# Patient Record
Sex: Male | Born: 1940 | Race: Black or African American | Hispanic: No | Smoking: Never smoker
Health system: Southern US, Community
[De-identification: ages and names within clinical notes are randomized; demographics above are authoritative.]

## PROBLEM LIST (undated history)

## (undated) DIAGNOSIS — N289 Disorder of kidney and ureter, unspecified: Secondary | ICD-10-CM

## (undated) DIAGNOSIS — F0390 Unspecified dementia without behavioral disturbance: Secondary | ICD-10-CM

## (undated) DIAGNOSIS — E119 Type 2 diabetes mellitus without complications: Secondary | ICD-10-CM

## (undated) DIAGNOSIS — I700 Atherosclerosis of aorta: Secondary | ICD-10-CM

## (undated) DIAGNOSIS — Z8679 Personal history of other diseases of the circulatory system: Secondary | ICD-10-CM

## (undated) DIAGNOSIS — I10 Essential (primary) hypertension: Secondary | ICD-10-CM

## (undated) HISTORY — PX: BELOW KNEE LEG AMPUTATION: SUR23

---

## 2017-08-13 ENCOUNTER — Encounter (HOSPITAL_COMMUNITY): Payer: Self-pay | Admitting: Nurse Practitioner

## 2017-08-13 ENCOUNTER — Emergency Department (HOSPITAL_COMMUNITY)
Admission: EM | Admit: 2017-08-13 | Discharge: 2017-08-13 | Disposition: A | Discharge status: Home / Self Care | Payer: Medicare Other | Attending: Emergency Medicine | Admitting: Emergency Medicine

## 2017-08-13 DIAGNOSIS — I10 Essential (primary) hypertension: Secondary | ICD-10-CM | POA: Diagnosis present

## 2017-08-13 DIAGNOSIS — R030 Elevated blood-pressure reading, without diagnosis of hypertension: Secondary | ICD-10-CM

## 2017-08-13 DIAGNOSIS — E119 Type 2 diabetes mellitus without complications: Secondary | ICD-10-CM | POA: Insufficient documentation

## 2017-08-13 HISTORY — DX: Type 2 diabetes mellitus without complications: E11.9

## 2017-08-13 HISTORY — DX: Essential (primary) hypertension: I10

## 2017-08-13 LAB — CBG MONITORING, ED: GLUCOSE-CAPILLARY: 227 mg/dL — AB (ref 65–99)

## 2017-08-13 MED ORDER — LOSARTAN POTASSIUM 50 MG PO TABS
50.0000 mg | ORAL_TABLET | Freq: Once | ORAL | Status: AC
  Administered 2017-08-13: 50 mg via ORAL
  Filled 2017-08-13: qty 1

## 2017-08-13 MED ORDER — AMLODIPINE BESYLATE 5 MG PO TABS
10.0000 mg | ORAL_TABLET | Freq: Once | ORAL | Status: AC
  Administered 2017-08-13: 10 mg via ORAL
  Filled 2017-08-13: qty 2

## 2017-08-13 MED ORDER — POLYETHYLENE GLYCOL 3350 17 GM/SCOOP PO POWD: 17.0000 g | Freq: Every day | ORAL | 0 refills | Status: AC

## 2017-08-13 MED ORDER — FUROSEMIDE 40 MG PO TABS
40.0000 mg | ORAL_TABLET | Freq: Once | ORAL | Status: AC
  Administered 2017-08-13: 40 mg via ORAL
  Filled 2017-08-13: qty 1

## 2017-08-13 MED ORDER — CARVEDILOL 25 MG PO TABS
25.0000 mg | ORAL_TABLET | Freq: Two times a day (BID) | ORAL | Status: DC
  Administered 2017-08-13: 25 mg via ORAL
  Filled 2017-08-13: qty 1

## 2017-08-13 NOTE — ED Provider Notes (Signed)
[facility] DEPT Provider Note   CSN: [id number] Arrival date & time: 08/13/17  1506     History   Chief Complaint No chief complaint on file.   HPI Shawn Pearson is a 76 y.o. male.  HPI 76 year old male who presents with elevated blood pressure. Patient transferred from [facility]. He states he did not receive any of his blood pressure medications today. When he arrived to nursing facility, he was told his blood pressure of high. He was sent to the ED. He complains of constipation. Also endorses mild headache. Denies nausea, vomiting, confusion, focal numbness/weakness, vision or speech changes, difficulty breathing.   Past Medical History:  Diagnosis Date  . Diabetes mellitus without complication ([place])   . Hypertension     There are no active problems to display for this patient.   History reviewed. No pertinent surgical history.     Home Medications    Prior to Admission medications   Not on File    Family History History reviewed. No pertinent family history.  Social History Social History  Substance Use Topics  . Smoking status: Unknown If Ever Smoked  . Smokeless tobacco: Never Used  . Alcohol use No     Allergies   Patient has no allergy information on record.   Review of Systems Review of Systems  Constitutional: Negative for fever.  Respiratory: Negative for shortness of breath.   Gastrointestinal: Positive for constipation. Negative for nausea and vomiting.  Neurological: Negative for speech difficulty.     Physical Exam Updated Vital Signs BP (!) 203/100 (BP Location: Right Arm)   Pulse 83   Temp 98.7 F (37.1 C) (Oral)   Resp 16   SpO2 96%   Physical Exam Physical Exam  Nursing note and vitals reviewed. Constitutional: Well developed, well nourished, non-toxic, and in no acute distress Head: Normocephalic and atraumatic.    Mouth/Throat: Oropharynx is clear and moist.  Neck: Normal range of motion. Neck supple.  Cardiovascular: Normal rate and regular rhythm.   Pulmonary/Chest: Effort normal and breath sounds normal.  Abdominal: Soft. There is no tenderness. There is no rebound and no guarding.  Musculoskeletal: bilateral BKA Neurological: Alert, no facial droop, fluent speech, moves all extremities symmetrically Skin: Skin is warm and dry.  Psychiatric: Cooperative   ED Treatments / Results  Labs (all labs ordered are listed, but only abnormal results are displayed) Labs Reviewed  CBG MONITORING, ED - Abnormal; Notable for the following:       Result Value   Glucose-Capillary 227 (*)    All other components within normal limits    EKG  EKG Interpretation None       Radiology No results found.  Procedures Procedures (including critical care time)  Medications Ordered in ED Medications  carvedilol (COREG) tablet 25 mg (not administered)  amLODipine (NORVASC) tablet 10 mg (not administered)  furosemide (LASIX) tablet 40 mg (not administered)  losartan (COZAAR) tablet 50 mg (not administered)     Initial Impression / Assessment and Plan / ED Course  I have reviewed the triage vital signs and the nursing notes.  Pertinent labs & imaging results that were available during my care of the patient were reviewed by me and considered in my medical decision making (see chart for details).     Patient well appearing. He is asymptomatic from his elevated blood pressure. I suspect this is because he did not receive  any of his blood pressure medications today, and nursing facility did not administer any prior to patient being transferred to the ED. Patient written for his BP medications. Will be transferred back to nursing facility.   Will start miralax for constipation, as he states he strains with BM. He is also on narcotics medications routinely, which is likely etiology.  Abdomen soft, benign,  he is non-tender.   Final Clinical Impressions(s) / ED Diagnoses   Final diagnoses:  Elevated blood pressure reading    New Prescriptions New Prescriptions   No medications on file     Forde Dandy, MD 08/13/17 [phone]

## 2017-08-13 NOTE — ED Notes (Signed)
Patient transported via [facility]. 

## 2017-08-13 NOTE — ED Notes (Signed)
Report called to [facility].

## 2017-08-13 NOTE — ED Notes (Signed)
Bed: WA22 Expected date:  Expected time:  Means of arrival:  Comments: 

## 2017-08-13 NOTE — ED Triage Notes (Signed)
Patient was transferred from [facility] today to a new nursing facility. Upon arrival patients blood pressure was 200/102, Patient states he had not taking any of his blood presssure medications today. Patient also had a slight headache. The facility sent him to the ED.

## 2017-08-13 NOTE — Discharge Instructions (Signed)
Your blood pressure was elevated today because you have not received any of your blood pressure medications for the day.  You are given your morning doses of medications. You also have hydralazine PRN for when your BP > 180 on your medication list.  Return for worsening symptoms, including confusion, escalating pain, difficulty breathing or chest pain, intractable vomiting, or any other symptoms concerning to you.

## 2017-08-13 NOTE — ED Notes (Signed)
Blood Sugar 227

## 2017-08-20 ENCOUNTER — Inpatient Hospital Stay (HOSPITAL_COMMUNITY)
Admission: EM | Admit: 2017-08-20 | Discharge: 2017-08-24 | DRG: 305 | Disposition: A | Discharge status: Skilled Nursing Facility | Payer: Medicare Other | Attending: Family Medicine | Admitting: Family Medicine

## 2017-08-20 ENCOUNTER — Emergency Department (HOSPITAL_COMMUNITY): Payer: Medicare Other

## 2017-08-20 ENCOUNTER — Other Ambulatory Visit: Payer: Self-pay

## 2017-08-20 DIAGNOSIS — E119 Type 2 diabetes mellitus without complications: Secondary | ICD-10-CM

## 2017-08-20 DIAGNOSIS — Z95810 Presence of automatic (implantable) cardiac defibrillator: Secondary | ICD-10-CM

## 2017-08-20 DIAGNOSIS — G546 Phantom limb syndrome with pain: Secondary | ICD-10-CM | POA: Diagnosis present

## 2017-08-20 DIAGNOSIS — I160 Hypertensive urgency: Principal | ICD-10-CM | POA: Diagnosis present

## 2017-08-20 DIAGNOSIS — E1151 Type 2 diabetes mellitus with diabetic peripheral angiopathy without gangrene: Secondary | ICD-10-CM | POA: Diagnosis present

## 2017-08-20 DIAGNOSIS — F0151 Vascular dementia with behavioral disturbance: Secondary | ICD-10-CM | POA: Diagnosis present

## 2017-08-20 DIAGNOSIS — N183 Chronic kidney disease, stage 3 unspecified: Secondary | ICD-10-CM | POA: Diagnosis present

## 2017-08-20 DIAGNOSIS — Z89512 Acquired absence of left leg below knee: Secondary | ICD-10-CM

## 2017-08-20 DIAGNOSIS — C20 Malignant neoplasm of rectum: Secondary | ICD-10-CM | POA: Diagnosis present

## 2017-08-20 DIAGNOSIS — I161 Hypertensive emergency: Secondary | ICD-10-CM | POA: Diagnosis present

## 2017-08-20 DIAGNOSIS — Z9119 Patient's noncompliance with other medical treatment and regimen: Secondary | ICD-10-CM

## 2017-08-20 DIAGNOSIS — Z794 Long term (current) use of insulin: Secondary | ICD-10-CM

## 2017-08-20 DIAGNOSIS — Z89511 Acquired absence of right leg below knee: Secondary | ICD-10-CM

## 2017-08-20 DIAGNOSIS — I509 Heart failure, unspecified: Secondary | ICD-10-CM | POA: Diagnosis present

## 2017-08-20 DIAGNOSIS — J9811 Atelectasis: Secondary | ICD-10-CM | POA: Diagnosis present

## 2017-08-20 DIAGNOSIS — I700 Atherosclerosis of aorta: Secondary | ICD-10-CM | POA: Diagnosis present

## 2017-08-20 DIAGNOSIS — R079 Chest pain, unspecified: Secondary | ICD-10-CM | POA: Diagnosis not present

## 2017-08-20 DIAGNOSIS — E1122 Type 2 diabetes mellitus with diabetic chronic kidney disease: Secondary | ICD-10-CM | POA: Diagnosis present

## 2017-08-20 DIAGNOSIS — I130 Hypertensive heart and chronic kidney disease with heart failure and stage 1 through stage 4 chronic kidney disease, or unspecified chronic kidney disease: Secondary | ICD-10-CM | POA: Diagnosis present

## 2017-08-20 LAB — BASIC METABOLIC PANEL
ANION GAP: 7 (ref 5–15)
BUN: 19 mg/dL (ref 6–20)
CALCIUM: 8.6 mg/dL — AB (ref 8.9–10.3)
CO2: 25 mmol/L (ref 22–32)
Chloride: 104 mmol/L (ref 101–111)
Creatinine, Ser: 1.45 mg/dL — ABNORMAL HIGH (ref 0.61–1.24)
GFR calc Af Amer: 52 mL/min — ABNORMAL LOW (ref >60)
GFR calc non Af Amer: 45 mL/min — ABNORMAL LOW (ref >60)
GLUCOSE: 170 mg/dL — AB (ref 65–99)
Potassium: 3.7 mmol/L (ref 3.5–5.1)
Sodium: 136 mmol/L (ref 135–145)

## 2017-08-20 LAB — CBC
HCT: 30.8 % — ABNORMAL LOW (ref 39.0–52.0)
HEMOGLOBIN: 10.1 g/dL — AB (ref 13.0–17.0)
MCH: 26.4 pg (ref 26.0–34.0)
MCHC: 32.8 g/dL (ref 30.0–36.0)
MCV: 80.4 fL (ref 78.0–100.0)
Platelets: 343 10*3/uL (ref 150–400)
RBC: 3.83 MIL/uL — ABNORMAL LOW (ref 4.22–5.81)
RDW: 14.9 % (ref 11.5–15.5)
WBC: 10.6 10*3/uL — ABNORMAL HIGH (ref 4.0–10.5)

## 2017-08-20 LAB — I-STAT TROPONIN, ED: TROPONIN I, POC: 0 ng/mL (ref 0.00–0.08)

## 2017-08-20 LAB — BRAIN NATRIURETIC PEPTIDE: B Natriuretic Peptide: 639.4 pg/mL — ABNORMAL HIGH (ref 0.0–100.0)

## 2017-08-20 MED ORDER — CARVEDILOL 12.5 MG PO TABS
25.0000 mg | ORAL_TABLET | Freq: Two times a day (BID) | ORAL | Status: DC
  Administered 2017-08-21: 25 mg via ORAL
  Filled 2017-08-20: qty 2

## 2017-08-20 MED ORDER — LEVOFLOXACIN 750 MG PO TABS
750.0000 mg | ORAL_TABLET | Freq: Once | ORAL | Status: AC
  Administered 2017-08-21: 750 mg via ORAL
  Filled 2017-08-20: qty 1

## 2017-08-20 MED ORDER — HYDRALAZINE HCL 20 MG/ML IJ SOLN
10.0000 mg | Freq: Once | INTRAMUSCULAR | Status: AC
  Administered 2017-08-20: 10 mg via INTRAVENOUS
  Filled 2017-08-20: qty 1

## 2017-08-20 MED ORDER — HYDRALAZINE HCL 20 MG/ML IJ SOLN
10.0000 mg | Freq: Once | INTRAMUSCULAR | Status: AC
  Administered 2017-08-21: 10 mg via INTRAVENOUS
  Filled 2017-08-20: qty 1

## 2017-08-20 MED ORDER — AMLODIPINE BESYLATE 5 MG PO TABS
10.0000 mg | ORAL_TABLET | Freq: Every day | ORAL | Status: DC
  Administered 2017-08-21: 10 mg via ORAL
  Filled 2017-08-20: qty 2

## 2017-08-20 MED ORDER — LOSARTAN POTASSIUM 50 MG PO TABS
50.0000 mg | ORAL_TABLET | Freq: Once | ORAL | Status: AC
  Administered 2017-08-21: 50 mg via ORAL
  Filled 2017-08-20 (×2): qty 1

## 2017-08-20 NOTE — ED Provider Notes (Signed)
[facility] EMERGENCY DEPARTMENT Provider Note   CSN: [id number] Arrival date & time: 08/20/17  2009     History   Chief Complaint Chief Complaint  Patient presents with  . Chest Pain    HPI Shawn Pearson is a 76 y.o. 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  It has been intermittent.  He describes his chest pain is sharp.  He reports the chest pain is better than it was.  Per EMS, he was given 3 nitroglycerin and 324 mg aspirin in route.  Patient reports he did not take any of his medications this morning.  He denies radiation of the pain.  Patient denies any shortness of breath, however was placed on 2L oxygen.  He denies any abdominal pain, nausea, vomiting, urinary symptoms.  I spoke with Wende Crease, med tech, at [facility], who reported that the patient has been refusing his medications and CBG checks most of the time sine he arrived last week. He has taken his medications a few days, but not the majority of days. She reports he did not have chest pain last week and only started complaining of chest pain today.  HPI  Past Medical History:  Diagnosis Date  . Diabetes mellitus without complication ([place])   . Hypertension     Patient Active Problem List   Diagnosis Date Noted  . Hypertensive urgency 08/21/2017  . DM2 (diabetes mellitus, type 2) ([place]) 08/21/2017  . CKD stage 3 secondary to diabetes ([place]) 08/21/2017  . Adenocarcinoma of rectum, stage 3 ([place]) 08/21/2017    No past surgical history on file.     Home Medications    Prior to Admission medications   Medication Sig Start Date End Date Taking? Authorizing Provider  amLODipine (NORVASC) 5 MG tablet Take 10 mg by mouth daily.   Yes [provider]  atorvastatin (LIPITOR) 40 MG tablet Take 40 mg by mouth at bedtime. 03/30/17 03/30/18 Yes [provider]  busPIRone (BUSPAR) 5 MG tablet Take 5 mg by mouth 3 (three) times daily.   Yes [provider]  carvedilol (COREG) 12.5 MG tablet Take 25 mg by mouth 2 (two) times daily. 10/07/13  Yes [provider]  docusate sodium (COLACE) 100 MG capsule Take 100 mg by mouth daily as needed for mild constipation.   Yes [provider]  finasteride (PROSCAR) 5 MG tablet Take 5 mg by mouth daily.   Yes [provider]  FLUoxetine (PROZAC) 20 MG capsule Take 20 mg by mouth daily. 08/13/17  Yes [provider]  furosemide (LASIX) 40 MG tablet Take 60 mg by mouth daily. 08/13/17  Yes [provider]  gabapentin (NEURONTIN) 300 MG capsule Take 300 mg by mouth 2 (two) times daily as needed. 08/13/17  Yes [provider]  hydrALAZINE (APRESOLINE) 10 MG tablet Take 10 mg by mouth as needed. 08/13/17  Yes [provider]  Insulin Glargine (BASAGLAR KWIKPEN) 100 UNIT/ML SOPN Inject 6 Units into the skin at bedtime. 03/30/17  Yes [provider]  losartan (COZAAR) 50 MG tablet Take 50 mg by mouth daily.   Yes [provider]  nitroGLYCERIN (NITROSTAT) 0.4 MG SL tablet Place 0.4 mg under the tongue as needed. 08/13/17  Yes [provider]  NOVOLOG FLEXPEN 100 UNIT/ML  FlexPen 4 (four) times daily. Sliding scale 150-200 2 units 201-250 4 units 251-300 6 units 301-350 8 units 350-400 10 units 08/13/17  Yes [provider]  ondansetron (ZOFRAN) 4 MG tablet Take 4 mg by mouth as needed.   Yes [provider]  pantoprazole (PROTONIX) 40 MG tablet Take 40 mg by mouth daily. 08/13/17  Yes [provider]  polyethylene glycol powder (GLYCOLAX/MIRALAX) powder Take 17 g by mouth daily. 08/13/17  Yes Forde Dandy, MD  tamsulosin (FLOMAX) 0.4 MG CAPS capsule Take 0.4 mg by mouth  daily. 08/13/17  Yes [provider]  Valproate Sodium (DEPAKENE) 250 MG/5ML SOLN solution Take 10 mLs by mouth 2 (two) times daily.   Yes [provider]    Family History No family history on file.  Social History Social History  Substance Use Topics  . Smoking status: Unknown If Ever Smoked  . Smokeless tobacco: Never Used  . Alcohol use No     Allergies   Patient has no allergy information on record.   Review of Systems Review of Systems  Constitutional: Negative for chills and fever.  HENT: Negative for facial swelling and sore throat.   Respiratory: Negative for shortness of breath.   Cardiovascular: Positive for chest pain.  Gastrointestinal: Negative for abdominal pain, nausea and vomiting.  Genitourinary: Negative for dysuria.  Musculoskeletal: Positive for myalgias (chronic bilateral leg pain). Negative for back pain.  Skin: Negative for rash and wound.  Neurological: Positive for headaches.  Psychiatric/Behavioral: The patient is not nervous/anxious.      Physical Exam Updated Vital Signs BP (!) 157/76   Pulse 73   Temp 98.7 F (37.1 C) (Oral)   Resp (!) 25   Ht 6\' 2"  (1.88 m)   Wt 99.3 kg (219 lb)   SpO2 97%   BMI 28.12 kg/m   Physical Exam  Constitutional: He appears well-developed and well-nourished. No distress.  HENT:  Head: Normocephalic and atraumatic.  Mouth/Throat: Oropharynx is clear and moist. No oropharyngeal exudate.  Eyes: Pupils are equal, round, and reactive to light. Conjunctivae are normal. Right eye exhibits no discharge. Left eye exhibits no discharge. No scleral icterus.  Neck: Normal range of motion. Neck supple. No thyromegaly present.  Cardiovascular: Normal rate, regular rhythm, normal heart sounds and intact distal pulses.  Exam reveals no gallop and no friction rub.   No murmur heard. Equal, strong radial pulses Pacemaker/defibrilator in place  Pulmonary/Chest: Effort normal. No stridor. No respiratory  distress. He has decreased breath sounds (L side). He has no wheezes. He has no rales.  Abdominal: Soft. Bowel sounds are normal. He exhibits no distension. There is no tenderness. There is no rebound and no guarding.  Musculoskeletal: He exhibits no edema.  Bilateral BKAs  Lymphadenopathy:    He has no cervical adenopathy.  Neurological: He is alert. Coordination normal.  CN 3-12 intact; normal sensation throughout; 5/5 strength in all 4 extremities; equal bilateral grip strength  Skin: Skin is warm and dry. No rash noted. He is not diaphoretic. No pallor.  Psychiatric: He has a normal mood and affect.  Nursing note and vitals reviewed.    ED Treatments / Results  Labs (all labs ordered are listed, but only abnormal results are displayed) Labs Reviewed  BASIC METABOLIC PANEL - Abnormal; Notable for the following:       Result Value   Glucose, Bld 170 (*)    Creatinine, Ser 1.45 (*)    Calcium 8.6 (*)  GFR calc non Af Amer 45 (*)    GFR calc Af Amer 52 (*)    All other components within normal limits  CBC - Abnormal; Notable for the following:    WBC 10.6 (*)    RBC 3.83 (*)    Hemoglobin 10.1 (*)    HCT 30.8 (*)    All other components within normal limits  BRAIN NATRIURETIC PEPTIDE - Abnormal; Notable for the following:    B Natriuretic Peptide 639.4 (*)    All other components within normal limits  TROPONIN I  TROPONIN I  TROPONIN I  I-STAT TROPONIN, ED  I-STAT TROPONIN, ED    EKG  EKG Interpretation  Date/Time:  Thursday August 20 2017 20:04:57 EDT Ventricular Rate:  72 PR Interval:  170 QRS Duration: 166 QT Interval:  474 QTC Calculation: 519 R Axis:   -42 Text Interpretation:  Electronic ventricular pacemaker Nonspecific ST and T wave abnormality No old tracing to compare Confirmed by Varney Biles [phone]) on 08/20/2017 8:47:27 PM       Radiology Dg Chest 2 View  Result Date: 08/20/2017 CLINICAL DATA:  Left-sided chest pain and palpitations.  EXAM: CHEST  2 VIEW COMPARISON:  None. FINDINGS: Cardiomegaly with aortic atherosclerosis. No aortic aneurysm. ICD device projects over the left mid lung with leads in the right atrium and right ventricle. Small focus of airspace opacity seen posteriorly at the left lung base. Pneumonia is not excluded in the setting of adjacent atelectasis. No overt pulmonary edema. No acute nor suspicious osseous abnormality. Status post median sternotomy. IMPRESSION: Cardiomegaly with aortic atherosclerosis. Patchy opacity at the left lung base posteriorly may reflect a a focal pneumonia in the setting of atelectasis. Electronically Signed   By: Ashley Royalty M.D.   On: 08/20/2017 21:13    Procedures Procedures (including critical care time)  Medications Ordered in ED Medications  BASAGLAR KWIKPEN KwikPen 6 Units (not administered)  losartan (COZAAR) tablet 50 mg (not administered)  gabapentin (NEURONTIN) capsule 300 mg (not administered)  busPIRone (BUSPAR) tablet 5 mg (not administered)  amLODipine (NORVASC) tablet 10 mg (not administered)  atorvastatin (LIPITOR) tablet 40 mg (not administered)  carvedilol (COREG) tablet 25 mg (not administered)  FLUoxetine (PROZAC) capsule 20 mg (not administered)  docusate sodium (COLACE) capsule 100 mg (not administered)  finasteride (PROSCAR) tablet 5 mg (not administered)  furosemide (LASIX) tablet 60 mg (not administered)  pantoprazole (PROTONIX) EC tablet 40 mg (not administered)  polyethylene glycol (MIRALAX / GLYCOLAX) packet 17 g (not administered)  tamsulosin (FLOMAX) capsule 0.4 mg (not administered)  Valproate Sodium (DEPAKENE) solution 500 mg (not administered)  acetaminophen (TYLENOL) tablet 650 mg (not administered)    Or  acetaminophen (TYLENOL) suppository 650 mg (not administered)  ondansetron (ZOFRAN) tablet 4 mg (not administered)    Or  ondansetron (ZOFRAN) injection 4 mg (not administered)  enoxaparin (LOVENOX) injection 40 mg (not administered)   insulin aspart (novoLOG) injection 0-9 Units (not administered)  hydrALAZINE (APRESOLINE) injection 10 mg (10 mg Intravenous Given 08/20/17 2145)  levofloxacin (LEVAQUIN) tablet 750 mg (750 mg Oral Given 08/21/17 0002)  hydrALAZINE (APRESOLINE) injection 10 mg (10 mg Intravenous Given 08/21/17 0005)  losartan (COZAAR) tablet 50 mg (50 mg Oral Given 08/21/17 0002)  acetaminophen (TYLENOL) tablet 650 mg (650 mg Oral Given 08/21/17 0156)  nitroGLYCERIN (NITROSTAT) SL tablet 0.4 mg (0.4 mg Sublingual Given 08/21/17 0246)     Initial Impression / Assessment and Plan / ED Course  I have reviewed the triage vital signs and  the nursing notes.  Pertinent labs & imaging results that were available during my care of the patient were reviewed by me and considered in my medical decision making (see chart for details).     Patient with chest pain and hypertensive urgency.  Per chart review, patient has had chest pain and several other emergency department visits elsewhere, however patient's chest pain does seem to be responding to sublingual nitroglycerin.  Patient's blood pressure was reduced in the ED with hydralazine IV, as well as home oral medications.  However, patient continued to have chest pain.  I consulted [facility] hospitalist and spoke with Dr. Alcario Drought at length who will admit the patient for observation and chest pain rule out, considering patient did have an increase from 0.00-0.04 with a delta troponin.  The finding on chest x-ray in the left base is chronic with comparison of prior chest x-rays and Dr. Alcario Drought advised that he will not continue treating for pneumonia-1 dose of Levaquin given in the ED.  Patient also evaluated by Dr. Kathrynn Humble who guided the patient's management.  I also discussed patient case with Dr. Christy Gentles who guided the patient's management as well and recommended admission.  Final Clinical Impressions(s) / ED Diagnoses   Final diagnoses:  Chest pain, unspecified type    Hypertensive urgency    New Prescriptions New Prescriptions   No medications on file     Frederica Kuster, Hershal Coria 08/21/17 [place], [place], MD 08/22/17 2334

## 2017-08-20 NOTE — ED Triage Notes (Signed)
Pt. Presents from [facility] complaining of L sided chest pain and palpations. Pt. Has sig. HX of CHF and uncontrolled diabetes. Pt. Did not take BP medication.   Pt. Had 3 Nitro, and 324 of aspirin prior to arrival.

## 2017-08-20 NOTE — ED Notes (Signed)
Pt. To XRAY via stretcher. 

## 2017-08-20 NOTE — ED Notes (Signed)
IV team unable to get IV.  MD made aware 

## 2017-08-20 NOTE — ED Notes (Signed)
Lab at beside attempting to draw labs.

## 2017-08-20 NOTE — ED Notes (Signed)
[place], Phlebotomy paged to attempt to obtain blood. Number left with triage.

## 2017-08-21 ENCOUNTER — Encounter (HOSPITAL_COMMUNITY): Payer: Self-pay | Admitting: Surgery

## 2017-08-21 DIAGNOSIS — E1159 Type 2 diabetes mellitus with other circulatory complications: Secondary | ICD-10-CM | POA: Diagnosis not present

## 2017-08-21 DIAGNOSIS — I161 Hypertensive emergency: Secondary | ICD-10-CM | POA: Diagnosis present

## 2017-08-21 DIAGNOSIS — C20 Malignant neoplasm of rectum: Secondary | ICD-10-CM | POA: Diagnosis present

## 2017-08-21 DIAGNOSIS — J9811 Atelectasis: Secondary | ICD-10-CM | POA: Diagnosis present

## 2017-08-21 DIAGNOSIS — G546 Phantom limb syndrome with pain: Secondary | ICD-10-CM | POA: Diagnosis present

## 2017-08-21 DIAGNOSIS — R079 Chest pain, unspecified: Secondary | ICD-10-CM

## 2017-08-21 DIAGNOSIS — R0789 Other chest pain: Secondary | ICD-10-CM | POA: Diagnosis not present

## 2017-08-21 DIAGNOSIS — I700 Atherosclerosis of aorta: Secondary | ICD-10-CM | POA: Diagnosis present

## 2017-08-21 DIAGNOSIS — N183 Chronic kidney disease, stage 3 (moderate): Secondary | ICD-10-CM | POA: Diagnosis present

## 2017-08-21 DIAGNOSIS — E119 Type 2 diabetes mellitus without complications: Secondary | ICD-10-CM

## 2017-08-21 DIAGNOSIS — E1122 Type 2 diabetes mellitus with diabetic chronic kidney disease: Secondary | ICD-10-CM | POA: Diagnosis not present

## 2017-08-21 DIAGNOSIS — Z89512 Acquired absence of left leg below knee: Secondary | ICD-10-CM | POA: Diagnosis not present

## 2017-08-21 DIAGNOSIS — I130 Hypertensive heart and chronic kidney disease with heart failure and stage 1 through stage 4 chronic kidney disease, or unspecified chronic kidney disease: Secondary | ICD-10-CM | POA: Diagnosis present

## 2017-08-21 DIAGNOSIS — E1151 Type 2 diabetes mellitus with diabetic peripheral angiopathy without gangrene: Secondary | ICD-10-CM | POA: Diagnosis present

## 2017-08-21 DIAGNOSIS — I160 Hypertensive urgency: Principal | ICD-10-CM

## 2017-08-21 DIAGNOSIS — Z89511 Acquired absence of right leg below knee: Secondary | ICD-10-CM | POA: Diagnosis not present

## 2017-08-21 DIAGNOSIS — Z95810 Presence of automatic (implantable) cardiac defibrillator: Secondary | ICD-10-CM | POA: Diagnosis not present

## 2017-08-21 DIAGNOSIS — Z9119 Patient's noncompliance with other medical treatment and regimen: Secondary | ICD-10-CM | POA: Diagnosis not present

## 2017-08-21 DIAGNOSIS — F0151 Vascular dementia with behavioral disturbance: Secondary | ICD-10-CM | POA: Diagnosis not present

## 2017-08-21 DIAGNOSIS — Z794 Long term (current) use of insulin: Secondary | ICD-10-CM | POA: Diagnosis not present

## 2017-08-21 DIAGNOSIS — I509 Heart failure, unspecified: Secondary | ICD-10-CM | POA: Diagnosis present

## 2017-08-21 LAB — GLUCOSE, CAPILLARY: Glucose-Capillary: 157 mg/dL — ABNORMAL HIGH (ref 65–99)

## 2017-08-21 LAB — CBG MONITORING, ED: GLUCOSE-CAPILLARY: 163 mg/dL — AB (ref 65–99)

## 2017-08-21 LAB — TROPONIN I: Troponin I: 0.04 ng/mL (ref <0.03)

## 2017-08-21 LAB — I-STAT TROPONIN, ED: Troponin i, poc: 0.04 ng/mL (ref 0.00–0.08)

## 2017-08-21 MED ORDER — LOSARTAN POTASSIUM 50 MG PO TABS
50.0000 mg | ORAL_TABLET | Freq: Every day | ORAL | Status: DC
  Administered 2017-08-22 – 2017-08-24 (×3): 50 mg via ORAL
  Filled 2017-08-21 (×4): qty 1

## 2017-08-21 MED ORDER — FLUOXETINE HCL 20 MG PO CAPS
20.0000 mg | ORAL_CAPSULE | Freq: Every day | ORAL | Status: DC
  Administered 2017-08-22 – 2017-08-24 (×3): 20 mg via ORAL
  Filled 2017-08-21 (×4): qty 1

## 2017-08-21 MED ORDER — NITROGLYCERIN 0.4 MG SL SUBL
0.4000 mg | SUBLINGUAL_TABLET | Freq: Once | SUBLINGUAL | Status: AC
  Administered 2017-08-21: 0.4 mg via SUBLINGUAL
  Filled 2017-08-21: qty 1

## 2017-08-21 MED ORDER — ACETAMINOPHEN 325 MG PO TABS
650.0000 mg | ORAL_TABLET | Freq: Four times a day (QID) | ORAL | Status: DC | PRN
  Administered 2017-08-21 – 2017-08-23 (×5): 650 mg via ORAL
  Filled 2017-08-21 (×6): qty 2

## 2017-08-21 MED ORDER — INSULIN ASPART 100 UNIT/ML ~~LOC~~ SOLN
0.0000 [IU] | Freq: Three times a day (TID) | SUBCUTANEOUS | Status: DC
  Administered 2017-08-21: 2 [IU] via SUBCUTANEOUS
  Administered 2017-08-22: 1 [IU] via SUBCUTANEOUS
  Administered 2017-08-22 – 2017-08-23 (×2): 2 [IU] via SUBCUTANEOUS
  Administered 2017-08-23: 1 [IU] via SUBCUTANEOUS
  Administered 2017-08-24: 2 [IU] via SUBCUTANEOUS
  Administered 2017-08-24: 1 [IU] via SUBCUTANEOUS
  Filled 2017-08-21: qty 1

## 2017-08-21 MED ORDER — INSULIN GLARGINE 100 UNIT/ML ~~LOC~~ SOLN
6.0000 [IU] | Freq: Every day | SUBCUTANEOUS | Status: DC
  Administered 2017-08-21 – 2017-08-23 (×3): 6 [IU] via SUBCUTANEOUS
  Filled 2017-08-21 (×4): qty 0.06

## 2017-08-21 MED ORDER — PANTOPRAZOLE SODIUM 40 MG PO TBEC
40.0000 mg | DELAYED_RELEASE_TABLET | Freq: Every day | ORAL | Status: DC
  Administered 2017-08-22 – 2017-08-24 (×3): 40 mg via ORAL
  Filled 2017-08-21 (×3): qty 1

## 2017-08-21 MED ORDER — NITROGLYCERIN 0.4 MG SL SUBL
SUBLINGUAL_TABLET | SUBLINGUAL | Status: AC
  Filled 2017-08-21: qty 1

## 2017-08-21 MED ORDER — ACETAMINOPHEN 325 MG PO TABS
650.0000 mg | ORAL_TABLET | Freq: Once | ORAL | Status: AC
  Administered 2017-08-21: 650 mg via ORAL
  Filled 2017-08-21: qty 2

## 2017-08-21 MED ORDER — ACETAMINOPHEN 650 MG RE SUPP: 650.0000 mg | Freq: Four times a day (QID) | RECTAL | Status: DC | PRN

## 2017-08-21 MED ORDER — DOCUSATE SODIUM 100 MG PO CAPS: 100.0000 mg | ORAL_CAPSULE | Freq: Every day | ORAL | Status: DC | PRN

## 2017-08-21 MED ORDER — AMLODIPINE BESYLATE 10 MG PO TABS
10.0000 mg | ORAL_TABLET | Freq: Every day | ORAL | Status: DC
  Administered 2017-08-22 – 2017-08-24 (×3): 10 mg via ORAL
  Filled 2017-08-21 (×3): qty 1

## 2017-08-21 MED ORDER — VALPROATE SODIUM 250 MG/5ML PO SOLN
500.0000 mg | Freq: Two times a day (BID) | ORAL | Status: DC
  Administered 2017-08-21 – 2017-08-24 (×6): 500 mg via ORAL
  Filled 2017-08-21 (×8): qty 10

## 2017-08-21 MED ORDER — FUROSEMIDE 40 MG PO TABS
60.0000 mg | ORAL_TABLET | Freq: Every day | ORAL | Status: DC
  Administered 2017-08-22 – 2017-08-24 (×3): 60 mg via ORAL
  Filled 2017-08-21 (×3): qty 1

## 2017-08-21 MED ORDER — ATORVASTATIN CALCIUM 40 MG PO TABS
40.0000 mg | ORAL_TABLET | Freq: Every day | ORAL | Status: DC
  Administered 2017-08-21 – 2017-08-23 (×3): 40 mg via ORAL
  Filled 2017-08-21 (×3): qty 1

## 2017-08-21 MED ORDER — CARVEDILOL 25 MG PO TABS
25.0000 mg | ORAL_TABLET | Freq: Two times a day (BID) | ORAL | Status: DC
  Administered 2017-08-21 – 2017-08-24 (×6): 25 mg via ORAL
  Filled 2017-08-21 (×6): qty 1

## 2017-08-21 MED ORDER — ONDANSETRON HCL 4 MG PO TABS: 4.0000 mg | ORAL_TABLET | Freq: Four times a day (QID) | ORAL | Status: DC | PRN

## 2017-08-21 MED ORDER — TAMSULOSIN HCL 0.4 MG PO CAPS
0.4000 mg | ORAL_CAPSULE | Freq: Every day | ORAL | Status: DC
  Administered 2017-08-22 – 2017-08-24 (×3): 0.4 mg via ORAL
  Filled 2017-08-21 (×3): qty 1

## 2017-08-21 MED ORDER — LEVOFLOXACIN IN D5W 750 MG/150ML IV SOLN
750.0000 mg | INTRAVENOUS | Status: DC
  Administered 2017-08-21: 750 mg via INTRAVENOUS
  Filled 2017-08-21: qty 150

## 2017-08-21 MED ORDER — FINASTERIDE 5 MG PO TABS
5.0000 mg | ORAL_TABLET | Freq: Every day | ORAL | Status: DC
  Administered 2017-08-22 – 2017-08-24 (×3): 5 mg via ORAL
  Filled 2017-08-21 (×4): qty 1

## 2017-08-21 MED ORDER — ENSURE ENLIVE PO LIQD
237.0000 mL | Freq: Two times a day (BID) | ORAL | Status: DC
  Administered 2017-08-22 – 2017-08-24 (×6): 237 mL via ORAL

## 2017-08-21 MED ORDER — POLYETHYLENE GLYCOL 3350 17 G PO PACK
17.0000 g | PACK | Freq: Every day | ORAL | Status: DC
  Administered 2017-08-22: 17 g via ORAL
  Filled 2017-08-21: qty 1

## 2017-08-21 MED ORDER — BUSPIRONE HCL 10 MG PO TABS
5.0000 mg | ORAL_TABLET | Freq: Three times a day (TID) | ORAL | Status: DC
  Administered 2017-08-21 – 2017-08-24 (×9): 5 mg via ORAL
  Filled 2017-08-21 (×9): qty 1

## 2017-08-21 MED ORDER — GABAPENTIN 300 MG PO CAPS
300.0000 mg | ORAL_CAPSULE | Freq: Two times a day (BID) | ORAL | Status: DC | PRN
  Administered 2017-08-22 – 2017-08-23 (×3): 300 mg via ORAL
  Filled 2017-08-21 (×3): qty 1

## 2017-08-21 MED ORDER — ONDANSETRON HCL 4 MG/2ML IJ SOLN: 4.0000 mg | Freq: Four times a day (QID) | INTRAMUSCULAR | Status: DC | PRN

## 2017-08-21 MED ORDER — ENOXAPARIN SODIUM 40 MG/0.4ML ~~LOC~~ SOLN
40.0000 mg | SUBCUTANEOUS | Status: DC
  Administered 2017-08-22 – 2017-08-23 (×2): 40 mg via SUBCUTANEOUS
  Filled 2017-08-21 (×3): qty 0.4

## 2017-08-21 NOTE — ED Notes (Signed)
Pt continues to refuse ordered medications. Pt asking to speak to SW. Also asking to return to his home in [place], [place].

## 2017-08-21 NOTE — ED Notes (Signed)
Pt refusing to give blood work. Pt refusing to do POC CBG. Pt then states "i dont want to talk."

## 2017-08-21 NOTE — Progress Notes (Signed)
Completed most of pt admission database at bedside. Pt unwilling to answer more questions until he can eat. Primary RN aware. Educated pt to room

## 2017-08-21 NOTE — ED Notes (Signed)
Pt refuses to let RN obtain blood pressure.  Pt uncomfortable. RN advised she would get him a hospital bed for comfort.  Pt agrees with this plan

## 2017-08-21 NOTE — ED Notes (Addendum)
Pt continuing to not want to be "stuck a hundred times." states he doesn't want to get well because he doesn't want to go back to the facility he lives at. pt agreeable to getting CBG and 2 units of insulin this AM. Phlebotomy at bedside. Pt allowed this RN to take BP once this AM.

## 2017-08-21 NOTE — H&P (Signed)
History and Physical    Melo Glascoe VEL:[id number] DOB: [birth year] DOA: 08/20/2017  PCP: Mabeline Caras, NP  Patient coming from: SNF  I have personally briefly reviewed patient's old medical records in [place]  Chief Complaint: Chest pain  HPI: Shawn Pearson is a 76 y.o. male with medical history significant of DM2, HTN, HLD, CAD, ICD placement, last EF 50% on a stress test in Feb this year (which was negative), Stage 3 rectal cancer which he is not felt to be candidate for surgery or aggressive treatment for based on surgery notes from summer of this year.  Patient presents to the ED with c/o L sided, sharp, CP, he is markedly hypertensive today, because he refused to take his meds at NH due to being unhappy with nurses there.  Review of his chartHistory of doing the same and winding up in the ED at [facility] some 8 times in the past 2 months (has built himself quite the frequent flyer reputation there).  Negative [facility] test at [facility] in Feb 2018.  Does have fairly severe h/o vascular disease including BLE amputations.  Actually when they tried to last do heart cath on him in 2017 they had to abort procedure because they couldn't cannulate the femoral artery.   ED Course: Given NTG, chest pain improved but now he complains of headache.  Put back on home meds and BP improved.  Trops were 0.00 and 0.04 in the ED.  CXR shows patchy left lung base opacity.   Review of Systems: As per HPI otherwise 10 point review of systems negative.   Past Medical History:  Diagnosis Date  . Diabetes mellitus without complication ([place])   . Hypertension     No past surgical history on file.   has an unknown smoking status. He has never used smokeless tobacco. He reports that he does not drink alcohol or use drugs.  Not on File  No family history on file.   Prior to Admission medications   Medication Sig Start Date End Date Taking? Authorizing Provider  amLODipine (NORVASC) 5 MG  tablet Take 10 mg by mouth daily.   Yes [provider]  atorvastatin (LIPITOR) 40 MG tablet Take 40 mg by mouth at bedtime. 03/30/17 03/30/18 Yes [provider]  busPIRone (BUSPAR) 5 MG tablet Take 5 mg by mouth 3 (three) times daily.   Yes [provider]  carvedilol (COREG) 12.5 MG tablet Take 25 mg by mouth 2 (two) times daily. 10/07/13  Yes [provider]  docusate sodium (COLACE) 100 MG capsule Take 100 mg by mouth daily as needed for mild constipation.   Yes [provider]  finasteride (PROSCAR) 5 MG tablet Take 5 mg by mouth daily.   Yes [provider]  FLUoxetine (PROZAC) 20 MG capsule Take 20 mg by mouth daily. 08/13/17  Yes [provider]  furosemide (LASIX) 40 MG tablet Take 60 mg by mouth daily. 08/13/17  Yes [provider]  gabapentin (NEURONTIN) 300 MG capsule Take 300 mg by mouth 2 (two) times daily as needed. 08/13/17  Yes [provider]  hydrALAZINE (APRESOLINE) 10 MG tablet Take 10 mg by mouth as needed. 08/13/17  Yes [provider]  Insulin Glargine (BASAGLAR KWIKPEN) 100 UNIT/ML SOPN Inject 6 Units into the skin at bedtime. 03/30/17  Yes [provider]  losartan (COZAAR) 50 MG tablet Take 50 mg by mouth daily.   Yes [provider]  nitroGLYCERIN (NITROSTAT) 0.4 MG SL  tablet Place 0.4 mg under the tongue as needed. 08/13/17  Yes [provider]  NOVOLOG FLEXPEN 100 UNIT/ML FlexPen 4 (four) times daily. Sliding scale 150-200 2 units 201-250 4 units 251-300 6 units 301-350 8 units 350-400 10 units 08/13/17  Yes [provider]  ondansetron (ZOFRAN) 4 MG tablet Take 4 mg by mouth as needed.   Yes [provider]  pantoprazole (PROTONIX) 40 MG tablet Take 40 mg by mouth daily. 08/13/17  Yes [provider]  polyethylene glycol powder (GLYCOLAX/MIRALAX) powder Take 17 g by mouth daily. 08/13/17  Yes Forde Dandy, MD  tamsulosin  (FLOMAX) 0.4 MG CAPS capsule Take 0.4 mg by mouth daily. 08/13/17  Yes [provider]  Valproate Sodium (DEPAKENE) 250 MG/5ML SOLN solution Take 10 mLs by mouth 2 (two) times daily.   Yes [provider]    Physical Exam: Vitals:   08/21/17 0030 08/21/17 0045 08/21/17 0145 08/21/17 0230  BP: (!) 183/80 (!) 180/83 130/87 (!) 157/76  Pulse: 85 84 78 73  Resp: (!) 28 18 14  (!) 25  Temp:      TempSrc:      SpO2: 93% 90% 96% 97%  Weight:      Height:        Constitutional: NAD, calm, comfortable Eyes: PERRL, lids and conjunctivae normal ENMT: Mucous membranes are moist. Posterior pharynx clear of any exudate or lesions.Normal dentition.  Neck: normal, supple, no masses, no thyromegaly Respiratory: clear to auscultation bilaterally, no wheezing, no crackles. Normal respiratory effort. No accessory muscle use.  Cardiovascular: Regular rate and rhythm, no murmurs / rubs / gallops. No extremity edema. 2+ pedal pulses. No carotid bruits.  Abdomen: no tenderness, no masses palpated. No hepatosplenomegaly. Bowel sounds positive.  Musculoskeletal: s/p B BKAs Skin: no rashes, lesions, ulcers. No induration Neurologic: CN 2-12 grossly intact. Sensation intact, DTR normal. Strength 5/5 in all 4.  Psychiatric: Normal judgment and insight. Alert and oriented x 3. Normal mood.    Labs on Admission: I have personally reviewed following labs and imaging studies  CBC:  Recent Labs Lab 08/20/17 2238  WBC 10.6*  HGB 10.1*  HCT 30.8*  MCV 80.4  PLT 564   Basic Metabolic Panel:  Recent Labs Lab 08/20/17 2238  NA 136  K 3.7  CL 104  CO2 25  GLUCOSE 170*  BUN 19  CREATININE 1.45*  CALCIUM 8.6*   GFR: Estimated Creatinine Clearance: 53.7 mL/min (A) (by C-G formula based on SCr of 1.45 mg/dL (H)). Liver Function Tests: No results for input(s): AST, ALT, ALKPHOS, BILITOT, PROT, ALBUMIN in the last 168 hours. No results for input(s): LIPASE, AMYLASE in the last 168  hours. No results for input(s): AMMONIA in the last 168 hours. Coagulation Profile: No results for input(s): INR, PROTIME in the last 168 hours. Cardiac Enzymes: No results for input(s): CKTOTAL, CKMB, CKMBINDEX, TROPONINI in the last 168 hours. BNP (last 3 results) No results for input(s): PROBNP in the last 8760 hours. HbA1C: No results for input(s): HGBA1C in the last 72 hours. CBG: No results for input(s): GLUCAP in the last 168 hours. Lipid Profile: No results for input(s): CHOL, HDL, LDLCALC, TRIG, CHOLHDL, LDLDIRECT in the last 72 hours. Thyroid Function Tests: No results for input(s): TSH, T4TOTAL, FREET4, T3FREE, THYROIDAB in the last 72 hours. Anemia Panel: No results for input(s): VITAMINB12, FOLATE, FERRITIN, TIBC, IRON, RETICCTPCT in the last 72 hours. Urine analysis: No results found for: COLORURINE, APPEARANCEUR, [place], [place], [place], [place], [place], [place],  PROTEINUR, UROBILINOGEN, NITRITE, LEUKOCYTESUR  Radiological Exams on Admission: Dg Chest 2 View  Result Date: 08/20/2017 CLINICAL DATA:  Left-sided chest pain and palpitations. EXAM: CHEST  2 VIEW COMPARISON:  None. FINDINGS: Cardiomegaly with aortic atherosclerosis. No aortic aneurysm. ICD device projects over the left mid lung with leads in the right atrium and right ventricle. Small focus of airspace opacity seen posteriorly at the left lung base. Pneumonia is not excluded in the setting of adjacent atelectasis. No overt pulmonary edema. No acute nor suspicious osseous abnormality. Status post median sternotomy. IMPRESSION: Cardiomegaly with aortic atherosclerosis. Patchy opacity at the left lung base posteriorly may reflect a a focal pneumonia in the setting of atelectasis. Electronically Signed   By: Ashley Royalty M.D.   On: 08/20/2017 21:13    EKG: Independently reviewed.  Assessment/Plan Principal Problem:   Hypertensive urgency Active Problems:   DM2 (diabetes mellitus, type 2) ([place])   CKD  stage 3 secondary to diabetes ([place])   Adenocarcinoma of rectum, stage 3 ([place])    1. Hypertensive urgency - 1. Review of the last 9 ED visits in the last 2 months alone at [facility] paint a consistent repetitive picture.  Frequently refusing meds at SNF, winding up with HTN urgency, pulmonary edema on CXR (which tends to be L>R asymetric), serial trops tend to be negative and patient is discharged home. 2. Because he is new to our ED staff, will go ahead and get him admitted for overnight obs. 3. Serial trops 4. Resume home meds 5. Avoid narcotics for CP 6. Tele monitor 2. Adenocarcinoma of rectum - 1. Has been recommended to patient that he pursue pal care 2. Surgery didn't feel that he was candidate for aggressive therapy according to their notes in May of this year 3. Please see notes from May 25 admission as well as GI notes from [facility] for more details 3. DM2 - Continue home Lantus 6 units and sensitive scale SSI AC 4. CKD stage 3 - chronic and stable  DVT prophylaxis: Lovenox Code Status: Full Family Communication: No family in room Disposition Plan: SNF after admit Consults called: None Admission status: Place in obs   Aryeh Butterfield, [place] Pager [phone]  If 7AM-7PM, please contact day team taking care of patient [email or web address] Password [facility]  08/21/2017, 3:36 AM

## 2017-08-21 NOTE — Progress Notes (Signed)
At 2010 patient c/o chest pain. B/P- 184/68, P-74, R- 22, T- 97.4 o, O2sat- 95%. After 3 rounds of nitro patient had relief from pain. MD notified. B/p- after 3rd nitro- 134/60, P- 74, O2sat- 93%.

## 2017-08-21 NOTE — ED Notes (Signed)
Pt repeatedly calling out to desk. Uncomfortable. Headache.  Refusing lab draws or continued monitoring via tele.  Abusive to staff.

## 2017-08-21 NOTE — Clinical Social Work Note (Signed)
Clinical Social Work Assessment  Patient Details  Name: Shawn Pearson MRN: [id number] Date of Birth: 03-12-1941  Date of referral:  08/21/17               Reason for consult:  Facility Placement                Permission sought to share information with:  Case Manager Permission granted to share information::  Yes, Verbal Permission Granted  Name::     Lynnea Ferrier   Agency::     Relationship::     Contact Information:     Housing/Transportation Living arrangements for the past 2 months:  [facility] ) Source of Information:  Patient Patient Interpreter Needed:  None Criminal Activity/Legal Involvement Pertinent to Current Situation/Hospitalization:  No - Comment as needed Significant Relationships:  None Lives with:    Do you feel safe going back to the place where you live?  Yes Need for family participation in patient care:  Yes (Comment)  Care giving concerns:  CSW spoke with pt at bedside. CSW was informed that pt has vascular dementia, but is able to answer some questions. There are no contacts listed for family in pt's chart.    Social Worker assessment / plan:  CSW spoke with pt at bedside. During this time CSW was informed that pt is from [facility] in [place]. Pt expressed to CSW that pt has a wife and children all who care for pt. CSW is unable to confirm this due to no further information being in the chart at this time. CSW was made aware by pt that pt is agreeable to SNF if needed.   Employment status:  Retired Forensic scientist:  Medicare PT Recommendations:  Not assessed at this time Information / Referral to community resources:     Patient/Family's Response to care:  Pt appeared to be understanding to plan of care at this time.   Patient/Family's Understanding of and Emotional Response to Diagnosis, Current Treatment, and Prognosis:  No further questions or concerns have been presented to CSW  at this time.   Emotional Assessment Appearance:  Appears stated age Attitude/Demeanor/Rapport:    Affect (typically observed):  Pleasant Orientation:   (per notes pt has vascular dementia. ) Alcohol / Substance use:  Not Applicable Psych involvement (Current and /or in the community):     Discharge Needs  Concerns to be addressed:  No discharge needs identified Readmission within the last 30 days:  No Current discharge risk:  None Barriers to Discharge:  No Barriers Identified   Wetzel Bjornstad, [place] 08/21/2017, 10:05 AM

## 2017-08-21 NOTE — Progress Notes (Signed)
Pharmacy Antibiotic Note Shawn Pearson is a 76 y.o. male admitted on 08/20/2017 with concern for pneumonia.  Pharmacy has been consulted for Levaquin dosing.  Plan: 1. Levaquin 750 mg IV every 24 hours  2. Will follow peripherally   Height: 6\' 2"  (188 cm) Weight: 219 lb (99.3 kg) IBW/kg (Calculated) : 82.2  Temp (24hrs), Avg:98.7 F (37.1 C), Min:98.7 F (37.1 C), Max:98.7 F (37.1 C)   Recent Labs Lab 08/20/17 2238  WBC 10.6*  CREATININE 1.45*    Estimated Creatinine Clearance: 53.7 mL/min (A) (by C-G formula based on SCr of 1.45 mg/dL (H)).    Not on File  Thank you for allowing pharmacy to be a part of this patient's care.  Vincenza Hews, PharmD, BCPS 08/21/2017, 9:37 AM

## 2017-08-21 NOTE — ED Notes (Signed)
Pt repeatedly calling out d/t "not feeling well"  Pt inconsolable and not pleased with any attempt to make him comfortable.  Pt states "ill just throw myself in the floor then"  RN advised pt that wasn't a good idea because he may get hurt.  "I'll just get hurt then"

## 2017-08-21 NOTE — Progress Notes (Addendum)
No charge note:  The patient was seen and examined in the ER. He was admitted after midnight please see H&P by Dr. Alcario Drought for detail. He is waiting for bed.  76 year old gentleman with history of type 2 diabetes, hypertension, dyslipidemia, coronary artery disease, ICD placement, stage III rectal cancer which was not felt to be candidate for surgery or aggressive treatment presented with left-sided chest pain which is sharp increased with coughing. Patient reported the pain is not better associated with mild shortness of breath and cough. Chest x-ray concerning for possible left lower lobe pneumonia versus atelectasis. Patient received a dose of Levaquin in the ER. I'm planning to continue Levaquin.  On reviewing patient's medical record, patient has had multiple ER visit at [facility] for chest pain. Patient has been refusing multiple tests and medication. PT, OT and social worker consulted for safe discharge plan to skilled nursing facility. For now continue current medical and supportive care.  Patient had echo in March 2018 which showed normal LV function with EF of 50%, mild-to-moderate LVH with stage I diastolic dysfunction.  Chronic kidney disease stage III, serum creatinine level around baseline. DVT prophylaxis with Lovenox subcutaneous

## 2017-08-21 NOTE — ED Notes (Signed)
Pt has refused all medications, this RN has paged admitting to let them know.

## 2017-08-22 ENCOUNTER — Encounter (HOSPITAL_COMMUNITY): Payer: Self-pay

## 2017-08-22 DIAGNOSIS — Z9119 Patient's noncompliance with other medical treatment and regimen: Secondary | ICD-10-CM

## 2017-08-22 LAB — GLUCOSE, CAPILLARY
GLUCOSE-CAPILLARY: 122 mg/dL — AB (ref 65–99)
GLUCOSE-CAPILLARY: 146 mg/dL — AB (ref 65–99)
Glucose-Capillary: 107 mg/dL — ABNORMAL HIGH (ref 65–99)
Glucose-Capillary: 193 mg/dL — ABNORMAL HIGH (ref 65–99)

## 2017-08-22 LAB — TROPONIN I: Troponin I: 0.04 ng/mL (ref <0.03)

## 2017-08-22 NOTE — Evaluation (Signed)
Physical Therapy Evaluation Patient Details Name: Shawn Pearson MRN: [id number] DOB: Apr 04, 1941 Today's Date: 08/22/2017   History of Present Illness  76 year old gentleman with history of type 2 diabetes, hypertension, dyslipidemia, coronary artery disease, ICD placement, stage III rectal cancer which was not felt to be candidate for surgery or aggressive treatment presented with left-sided chest pain which is sharp increased with coughing. Patient reported the pain is not better associated with mild shortness of breath and cough. Chest x-ray concerning for possible left lower lobe pneumonia versus atelectasis. Pt also with HTN urgency.   Clinical Impression  Pt admitted with above diagnosis. Pt currently with functional limitations due to the deficits listed below (see PT Problem List). Pt was able to perform anterior posterior transfer with mod assist and cues.  Will need to continue SNF with therapy.  Will follow acutely.  Pt will benefit from skilled PT to increase their independence and safety with mobility to allow discharge to the venue listed below.      Follow Up Recommendations SNF;Supervision/Assistance - 24 hour    Equipment Recommendations  None recommended by PT    Recommendations for Other Services       Precautions / Restrictions Precautions Precautions: Fall Restrictions Weight Bearing Restrictions: No      Mobility  Bed Mobility Overal bed mobility: Needs Assistance Bed Mobility: Supine to Sit     Supine to sit: Mod assist;HOB elevated     General bed mobility comments: Pt insisted on HOB to be raised to highest level.  Raised HOB and pt was able to use side rail to pull himself into long sitting.  With PT assist, pt was able to do posterior transfer into chair.  Pt needed mod cues for hand placement to use UEs to push self backwards with PT assisting to hold pt anterior so that his weight was off buttocks.  Used pad a little to square pt up but pt with time was  able to complete the rest of transfer with several rest breaks.    Transfers Overall transfer level: Needs assistance   Transfers: Comptroller transfers: Mod assist;From elevated surface   General transfer comment: See above for details of anterior posterior transfer  Ambulation/Gait             General Gait Details: Does not ambulate  Stairs            Wheelchair Mobility    Modified Rankin (Stroke Patients Only)       Balance Overall balance assessment: Needs assistance Sitting-balance support: Bilateral upper extremity supported;Feet supported Sitting balance-Leahy Scale: Poor Sitting balance - Comments: relies on bil UE support.  Postural control: Posterior lean                                   Pertinent Vitals/Pain Pain Assessment: Faces Faces Pain Scale: Hurts even more Pain Location: Bil residual limbs Pain Descriptors / Indicators: Aching;Grimacing;Guarding Pain Intervention(s): Limited activity within patient's tolerance;Monitored during session;Repositioned   VSS Home Living Family/patient expects to be discharged to:: Skilled nursing facility                 Additional Comments: Has been in SNF.   States that house was difficult to mobilize in.  He has w/c but bathroom they had to take door off and he had to be transported by ambulance to get into and out  of house.  Pt has been in SNF for 1 week.  He states they have awful equipment.     Prior Function Level of Independence: Needs assistance   Gait / Transfers Assistance Needed: Needs assist with Anterior/posterior transfers.    ADL's / Homemaking Assistance Needed: Needed assist        Hand Dominance        Extremity/Trunk Assessment   Upper Extremity Assessment Upper Extremity Assessment: Defer to OT evaluation    Lower Extremity Assessment Lower Extremity Assessment: Generalized weakness    Cervical / Trunk  Assessment Cervical / Trunk Assessment: Normal  Communication   Communication: No difficulties  Cognition Arousal/Alertness: Awake/alert Behavior During Therapy: Flat affect;Anxious Overall Cognitive Status: No family/caregiver present to determine baseline cognitive functioning                                        General Comments      Exercises     Assessment/Plan    PT Assessment Patient needs continued PT services  PT Problem List Decreased balance;Decreased mobility;Decreased activity tolerance;Decreased knowledge of use of DME;Decreased safety awareness;Decreased knowledge of precautions;Pain;Decreased strength;Decreased range of motion       PT Treatment Interventions DME instruction;Functional mobility training;Therapeutic activities;Therapeutic exercise;Balance training;Patient/family education    PT Goals (Current goals can be found in the Care Plan section)  Acute Rehab PT Goals Patient Stated Goal: to get better PT Goal Formulation: With patient Time For Goal Achievement: 09/05/17 Potential to Achieve Goals: Good    Frequency Min 2X/week   Barriers to discharge Decreased caregiver support      Co-evaluation               AM-PAC PT "6 Clicks" Daily Activity  Outcome Measure Difficulty turning over in bed (including adjusting bedclothes, sheets and blankets)?: A Lot Difficulty moving from lying on back to sitting on the side of the bed? : A Lot Difficulty sitting down on and standing up from a chair with arms (e.g., wheelchair, bedside commode, etc,.)?: Unable Help needed moving to and from a bed to chair (including a wheelchair)?: A Lot Help needed walking in hospital room?: Total Help needed climbing 3-5 steps with a railing? : Total 6 Click Score: 9    End of Session   Activity Tolerance: Patient limited by fatigue;Patient limited by pain Patient left: in chair;with call bell/phone within reach;with chair alarm set Nurse  Communication: Mobility status PT Visit Diagnosis: Muscle weakness (generalized) (M62.81);Pain Pain - Right/Left:  (bilateral) Pain - part of body: Knee;Leg    Time: 7209-4709 PT Time Calculation (min) (ACUTE ONLY): 20 min   Charges:   PT Evaluation $PT Eval Moderate Complexity: 1 Mod     PT G Codes:        Mike Berntsen,PT Acute Rehabilitation [phone] (pager)   Denice Paradise 08/22/2017, 10:29 AM

## 2017-08-22 NOTE — Care Management Note (Addendum)
Case Management Note  Patient Details  Name: Shawn Pearson MRN: [id number] Date of Birth: May 15, 1941  Subjective/Objective:                 Patient with order to discharge to Assisted living facility (ALF). ALF discharge facilitated through [facility] ([place]). Please refer to CSW notes for disposition plan, no CM needs at this time. CM signing off.    Action/Plan:   Expected Discharge Date:  08/22/17               Expected Discharge Plan:  Skilled Nursing Facility  In-House Referral:  Clinical Social Work  Discharge planning Services  CM Consult  Post Acute Care Choice:    Choice offered to:     DME Arranged:    DME Agency:     HH Arranged:    [place] Agency:     Status of Service:  Completed, signed off  If discussed at [employer], dates discussed:    Additional Comments:  Carles Collet, RN 08/22/2017, 2:56 PM

## 2017-08-22 NOTE — Evaluation (Addendum)
Occupational Therapy Evaluation Patient Details Name: Shawn Pearson MRN: [id number] DOB: 1941/01/24 Today's Date: 08/22/2017    History of Present Illness 76 year old gentleman with history of type 2 diabetes, Bil BKA, hypertension, dyslipidemia, coronary artery disease, ICD placement, stage III rectal cancer which was not felt to be candidate for surgery or aggressive treatment presented with left-sided chest pain which is sharp increased with coughing. Patient reported the pain is not better associated with mild shortness of breath and cough. Chest x-ray concerning for possible left lower lobe pneumonia versus atelectasis. Pt also with HTN urgency.    Clinical Impression   PTA, pt was living at ALF and requiring assistance with ADLs. Pt currently requiring Min A for UB ADLs and Max A for LB ADLs. Provided education on desensition techniques for phantom limb pain and edema management for R hand. Pt would benefit from further acute OT to facilitate safe dc. Recommend dc to SNF for further OT to increase occupational performance and participation.     Follow Up Recommendations  SNF    Equipment Recommendations  Other (comment) (Defer to next venue)    Recommendations for Other Services       Precautions / Restrictions Precautions Precautions: Fall Restrictions Weight Bearing Restrictions: No      Mobility Bed Mobility Overal bed mobility: Needs Assistance Bed Mobility: Supine to Sit;Sit to Supine     Supine to sit: Mod assist;HOB elevated Sit to supine: Mod assist   General bed mobility comments: Pt insisted on HOB to be raised to highest level. Pt requiring assistance to transition hips towards EOB after long sitting with HOB support.  Pt requiring several rest breaks and increased encouragement during bed mobility. Pt requiring Mod A to manage BLEs when returning to supine  Transfers                 General transfer comment: Pt declining at this time reporting he  just got back into bed from sitting in chair.     Balance Overall balance assessment: Needs assistance Sitting-balance support: Bilateral upper extremity supported;Feet supported Sitting balance-Leahy Scale: Fair Sitting balance - Comments: Able to maintain sitting at EOB during grooming Postural control: Posterior lean                                 ADL either performed or assessed with clinical judgement   ADL Overall ADL's : Needs assistance/impaired Eating/Feeding: Set up;Sitting   Grooming: Oral care;Set up;Supervision/safety;Sitting Grooming Details (indicate cue type and reason): Pt able to maintain sitting posture at EOB to grooming task. However, pt fatigues quickly.  Upper Body Bathing: Minimal assistance;Bed level;Sitting   Lower Body Bathing: Maximal assistance;Bed level   Upper Body Dressing : Minimal assistance;Sitting;Bed level   Lower Body Dressing: Maximal assistance;Bed level                 General ADL Comments: Pt demonstrating decreased functional performance.  Pt requiring Max A for bathing, dressing, and toileting. Pt fatigues quickly and reports significant pain in Bil residual limbs. Provided pt with education on desensitiation strategies for phantom limb pain including massage, tapping, and using different textures; pt reports this helps with pain during session     Vision         Perception     Praxis      Pertinent Vitals/Pain Pain Assessment: Faces Faces Pain Scale: Hurts even more Pain Location: Bil residual limbs. Pt reports "  phantom limb pain" Pain Descriptors / Indicators: Aching;Grimacing;Guarding Pain Intervention(s): Monitored during session;Limited activity within patient's tolerance;Relaxation;Other (comment) (Used desensitization techniques to decrease pain)     Hand Dominance Right   Extremity/Trunk Assessment Upper Extremity Assessment Upper Extremity Assessment: Generalized weakness;RUE  deficits/detail RUE Deficits / Details: Edema in R hand. Educated pt on hand/UE excercises and retrograde massage to decrease edema RUE Coordination: decreased fine motor   Lower Extremity Assessment Lower Extremity Assessment: Generalized weakness (Bil BKA at baseline)   Cervical / Trunk Assessment Cervical / Trunk Assessment: Normal   Communication Communication Communication: No difficulties   Cognition Arousal/Alertness: Awake/alert Behavior During Therapy: Flat affect;Anxious Overall Cognitive Status: No family/caregiver present to determine baseline cognitive functioning                                 General Comments: Pt providing inconsistent information for home and PLOF. Pt able to follow simple commands with increased time   General Comments  Pt reporting that he would like to talk with the social worker about DC plans stating "the place I was at was a trash dump"    Exercises Exercises: General Upper Extremity;Other exercises General Exercises - Upper Extremity Elbow Flexion: AROM;Right;10 reps;Seated Elbow Extension: AROM;Right;10 reps;Seated Wrist Flexion: AROM;Right;10 reps;Seated Wrist Extension: AROM;Right;10 reps;Seated Digit Composite Flexion: AROM;Right;10 reps;Seated Composite Extension: AROM;Right;10 reps;Seated Other Exercises Other Exercises: Education on retrograde massage, ROM, and elevation for edema management   Shoulder Instructions      Home Living Family/patient expects to be discharged to:: Skilled nursing facility                                 Additional Comments: Has been in ALF.   States that house was difficult to mobilize in.  He has w/c but bathroom they had to take door off and he had to be transported by ambulance to get into and out of house.      Prior Functioning/Environment Level of Independence: Needs assistance  Gait / Transfers Assistance Needed: Needs assist with Anterior/posterior transfers.    ADL's / Homemaking Assistance Needed: Needed assist with bathing and dressing. Performed ADLs at bedlevel            OT Problem List: Decreased range of motion;Decreased activity tolerance;Decreased strength;Impaired balance (sitting and/or standing);Decreased knowledge of use of DME or AE      OT Treatment/Interventions: Self-care/ADL training;Therapeutic exercise;Energy conservation;DME and/or AE instruction;Therapeutic activities;Patient/family education    OT Goals(Current goals can be found in the care plan section) Acute Rehab OT Goals Patient Stated Goal: to get better OT Goal Formulation: With patient Time For Goal Achievement: 09/05/17 Potential to Achieve Goals: Good ADL Goals Pt Will Perform Grooming: with set-up;with supervision;sitting Pt Will Perform Upper Body Dressing: with set-up;with supervision;sitting Pt Will Perform Lower Body Dressing: with min assist;bed level Pt Will Transfer to Toilet: with min assist;bedside commode;anterior/posterior transfer  OT Frequency: Min 2X/week   Barriers to D/C:            Co-evaluation              AM-PAC PT "6 Clicks" Daily Activity     Outcome Measure Help from another person eating meals?: None Help from another person taking care of personal grooming?: A Little Help from another person toileting, which includes using toliet, bedpan, or urinal?: A Lot Help from another person  bathing (including washing, rinsing, drying)?: A Lot Help from another person to put on and taking off regular upper body clothing?: A Lot Help from another person to put on and taking off regular lower body clothing?: A Lot 6 Click Score: 15   End of Session Nurse Communication: Mobility status;Other (comment) (Pt requiring linens changed)  Activity Tolerance: Patient limited by fatigue Patient left: in bed;with call bell/phone within reach;with bed alarm set  OT Visit Diagnosis: Muscle weakness (generalized) (M62.81)                 Time: 4128-7867 OT Time Calculation (min): 25 min Charges:  OT General Charges $OT Visit: 1 Visit OT Evaluation $OT Eval Moderate Complexity: 1 Mod OT Treatments $Self Care/Home Management : 8-22 mins G-Codes:     [place], OTR/L Acute Rehab Pager: [phone] Office: [place] 08/22/2017, 4:14 PM

## 2017-08-22 NOTE — Clinical Social Work Note (Addendum)
[facility] is refusing to patient back. CSW spoke with administrator, Herbert Spires, and she said they are doing an "emergency discharge." Patient refuses treatment, not easy to get along with, and needs a higher level of care. PT recommending SNF but patient is on day 1/3 for Medicare inpatient days. MD and RN updated. CSW called and left message for Surveyor, quantity of social work. Awaiting call back before speaking with patient.  Dayton Scrape, [place] [phone]  3:38 pm CSW notified patient that ALF will not take him back. Patient stated he wanted to return home to [place], [place]. [place] asked about family. He stated he has a wife, daughter, grandchildren, and great-grandchildren. Patient gave CSW permission to discuss plans with his wife, Antwan Pandya [phone]). CSW left her a voicemail.   Dayton Scrape, [place]  4:07 pm CSW tried calling wife again. Went straight to [employer].  Dayton Scrape, [place]

## 2017-08-22 NOTE — NC FL2 (Addendum)
[place] MEDICAID FL2 LEVEL OF CARE SCREENING TOOL     IDENTIFICATION  Patient Name: Shawn Pearson Birthdate: February 11, 1941 Sex: male Admission Date (Current Location): 08/20/2017  [facility] and [place] Number:  Herbalist and Address:  [facility], [place] [address], [place], [place] [address]      Provider Number: [id number]  Attending Physician Name and Address:  Samuella Cota, MD  Relative Name and Phone Number:       Current Level of Care: Hospital Recommended Level of Care: [place] (with HHPT and RN) Prior Approval Number:    Date Approved/Denied:   PASRR Number:   [id number]  Discharge Plan: Other (Comment) (ALF with HHPT and RN)    Current Diagnoses: Patient Active Problem List   Diagnosis Date Noted  . Hypertensive urgency 08/21/2017  . DM2 (diabetes mellitus, type 2) ([place]) 08/21/2017  . CKD stage 3 secondary to diabetes ([place]) 08/21/2017  . Adenocarcinoma of rectum, stage 3 ([place]) 08/21/2017  . Hypertensive emergency 08/21/2017  . Chest pain     Orientation RESPIRATION BLADDER Height & Weight     Self, Situation, Time, Place  Normal Continent Weight: 220 lb (99.8 kg) Height:  6\' 2"  (188 cm) (BKA)  BEHAVIORAL SYMPTOMS/MOOD NEUROLOGICAL BOWEL NUTRITION STATUS  Other (Comment) (Irritable, anxious, uncooperative)  (None) Continent Diet (Carb modified, low salt)  AMBULATORY STATUS COMMUNICATION OF NEEDS Skin   Total Care Verbally Normal                       Personal Care Assistance Level of Assistance              Functional Limitations Info  Sight, Hearing, Speech Sight Info: Adequate Hearing Info: Adequate Speech Info: Adequate    SPECIAL CARE FACTORS FREQUENCY  Blood pressure, PT (By licensed PT)     PT Frequency: 3 x week              Contractures Contractures Info: Not present    Additional Factors Info  Code Status, Allergies Code Status Info: Full Allergies Info: Not on  file           Current Medications (08/22/2017):  This is the current hospital active medication list Current Facility-Administered Medications  Medication Dose Route Frequency Provider Last Rate Last Dose  . acetaminophen (TYLENOL) tablet 650 mg  650 mg Oral Q6H PRN Etta Quill, DO   650 mg at 08/22/17 0102   Or  . acetaminophen (TYLENOL) suppository 650 mg  650 mg Rectal Q6H PRN Etta Quill, DO      . amLODipine (NORVASC) tablet 10 mg  10 mg Oral Daily Jennette Kettle M, DO   10 mg at 08/22/17 1022  . atorvastatin (LIPITOR) tablet 40 mg  40 mg Oral QHS Jennette Kettle M, DO   40 mg at 08/21/17 2245  . busPIRone (BUSPAR) tablet 5 mg  5 mg Oral TID Etta Quill, DO   5 mg at 08/22/17 1022  . carvedilol (COREG) tablet 25 mg  25 mg Oral BID Jennette Kettle M, DO   25 mg at 08/22/17 1022  . docusate sodium (COLACE) capsule 100 mg  100 mg Oral Daily PRN Etta Quill, DO      . enoxaparin (LOVENOX) injection 40 mg  40 mg Subcutaneous Q24H Alcario Drought, Jared M, DO      . feeding supplement (ENSURE ENLIVE) (ENSURE ENLIVE) liquid 237 mL  237 mL Oral BID  BM Rosita Fire, MD   237 mL at 08/22/17 1000  . finasteride (PROSCAR) tablet 5 mg  5 mg Oral Daily Jennette Kettle M, DO   5 mg at 08/22/17 1023  . FLUoxetine (PROZAC) capsule 20 mg  20 mg Oral Daily Jennette Kettle M, DO   20 mg at 08/22/17 1023  . furosemide (LASIX) tablet 60 mg  60 mg Oral Daily Jennette Kettle M, DO   60 mg at 08/22/17 1022  . gabapentin (NEURONTIN) capsule 300 mg  300 mg Oral BID PRN Etta Quill, DO      . insulin aspart (novoLOG) injection 0-9 Units  0-9 Units Subcutaneous TID WC Etta Quill, DO   1 Units at 08/22/17 1200  . insulin glargine (LANTUS) injection 6 Units  6 Units Subcutaneous QHS Etta Quill, DO   6 Units at 08/21/17 2245  . losartan (COZAAR) tablet 50 mg  50 mg Oral Daily Jennette Kettle M, DO   50 mg at 08/22/17 1023  . ondansetron (ZOFRAN) tablet 4 mg  4 mg Oral Q6H PRN Etta Quill, DO       Or  . ondansetron [facility]) injection 4 mg  4 mg Intravenous Q6H PRN Etta Quill, DO      . pantoprazole (PROTONIX) EC tablet 40 mg  40 mg Oral Daily Jennette Kettle M, DO   40 mg at 08/22/17 1023  . polyethylene glycol (MIRALAX / GLYCOLAX) packet 17 g  17 g Oral Daily Jennette Kettle M, DO   17 g at 08/22/17 1023  . tamsulosin (FLOMAX) capsule 0.4 mg  0.4 mg Oral Daily Jennette Kettle M, DO   0.4 mg at 08/22/17 1022  . Valproate Sodium (DEPAKENE) solution 500 mg  500 mg Oral BID Jennette Kettle M, DO   500 mg at 08/22/17 1023     Discharge Medications: Please see discharge summary for a list of discharge medications.  Relevant Imaging Results:  Relevant Lab Results:   Additional Information SS#: [id number]  Candie Chroman, LCSW

## 2017-08-22 NOTE — Discharge Summary (Addendum)
Physician Discharge Summary  Shawn Pearson EXB:[id number] DOB: 03/18/1941 DOA: 08/20/2017  PCP: Mabeline Caras, NP  Admit date: 08/20/2017 Discharge date: 08/22/2017  Recommendations for Outpatient Follow-up:  1. Follow-up chronic chest pain, noncompliance  Follow-up Information    Mabeline Caras, NP. Schedule an appointment as soon as possible for a visit in 1 week(s).   Specialty:  Nurse Practitioner Contact information: [place] [address] [phone]            Discharge Diagnoses:  1. CP atypical 2. Hypertensive urgency 3. Atelectasis 4. CHB s/p PM/ICD 5. DM with PVD 6. CKD stage III 7. Essential HTN 8. Noncompliance 9. Vascular dementia  10. Aortic atherosclerosis  Discharge Condition: improved Disposition: SNF  Diet recommendation: resume previous diet at facility, suggest carb-modified, low salt  Filed Weights   08/20/17 2140 08/21/17 1530 08/22/17 0419  Weight: 99.3 kg (219 lb) 98 kg (216 lb 1.6 oz) 99.8 kg (220 lb)    History of present illness:  76yom presented with chest pain. Admitted for hypertensive urgency, chest pain.  Hospital Course:  Patient was observed overnight, had an episode of chest pain, now resolved. Troponins minimally elevated, EKGs non-acute. BP improved with medications. Chart reviewed and summarized below. Documented history of multiple ED visits for CP, elevated blood pressure, noted to have chronically elevated troponin. Condition complicated by noncompliance with medications and medical treatment. Patient appears comfortable, ACS has been ruled out, he is asymptomatic. I do not see any further benefit to hospitalization. Individual issues as below.  CP atypical. Likely secondary to hypertensive urgency. Echo in March 2018 which showed normal LV function with EF of 50%, mild-to-moderate LVH with stage I diastolic dysfunction. [place] 2017 had to be aborted secondary to inability to pass catheter. -Troponins  minimally elevated, consistent with values documented at outside ED. EKG non-acute. Pain free. No evidence of ACS. No further evaluation suggested.  Hypertensive urgency -secondary to noncompliance, refusing meds -resolved. Has been refusing to take some medications. -continue anti-hypertensives.  Atelectasis -no cough, no fever, no evidence of infection. Trivial elevation of WBC noted. No further abx recommended.  CHB s/p PM/ICD. Followed by Dr. Meryl Dare at [facility] -carvedilol, furosemide   DM with PVD, s/p bilateral BKA with phantom limb pain -CBG stable. Continue Lantus. -continue gabapentin  CKD stage III -creatinine appears to be at baseline compared to ED visit 10/19.  Essential HTN -continue amlodipine, carvedilol, furosemide  Rectal cancer. Not felt to be a candidate for surgery or aggressive treatment  Noncompliance. Refusing meds at SNF  Vascular dementia with behavioral disturbance -Depakene  Aortic atherosclerosis -statin   Consultants:  None  Procedures:  None   Today's assessment: S:  Refusing labs, abusive to staff, refuses blood pressure check, refusing medications, refusing to answer questions.  Episode of chest pain last night treated with nitroglycerin.  Patient reports no chest pain or shortness of breath. No cough. Complains of phantom limb pain.  O: Vitals:  Vitals:   08/22/17 0419 08/22/17 1200  BP: (!) 170/65 (!) 149/71  Pulse: 68 87  Resp: 18 18  Temp: 98.5 F (36.9 C) 98.7 F (37.1 C)  SpO2: 96% 97%     Constitutional:  Appears calm and comfortable ENMT:  grossly normal hearing  Respiratory:  CTA bilaterally, no w/r/r.  Respiratory effort normal.  Cardiovascular:  RRR, no m/r/g No LE extremity edema. Bilateral BKA noted.  Psychiatric:  Mental status Mood, affect appropriate Oriented to self, location, day, month, year   I have  personally reviewed the following:   Labs:  CBG stable  No labs  since admission other than: (patient refusing)  troponins flat, 0.4 >> 0.04 >> 0.04  Imaging studies:  CXR atelectasis (independently reviewed)  Medical tests:  EKG 11/1 paced rhythm  EKG 11/2 1144 ST no acute changes  EKG 11/2 2021 paced rhythm  Review and summation of old records:  10/25 ED for headache, elevated BP in context of missed medications.  Multiple ED visits at [facility] (10.19, 10/18, 10/6, 9/29, 9/27, 9/21, 9/13) for chest pain and HTN. Noted to chronically elevated troponin, multiple entries of noncompliance and verbal abuse.  Echo 12/2016 Summary:  1. The study is technically limited due to patient body habitus.  2. There is low normal LV function.  The ejection fraction is estimated to be 50%. 3. Mild to moderate LVH seen with Abnormal left ventricular diastolic filling is observed, consistent with impaired LV relaxation(Stage I). 4. A pacemaker wire is visualized in the right atrium and right ventricle. 5. There is a trivial amount of mitral regurgitation.  Normal Lexiscan 12/2014  Catheterization was attempted in 2017 due to ongoing chest pain, but a sheath was unable to be advanced secondary to his severe arterial disease.      Discharge Instructions   Allergies as of 08/22/2017   Not on File     Medication List    TAKE these medications   amLODipine 5 MG tablet Commonly known as:  NORVASC Take 10 mg by mouth daily.   atorvastatin 40 MG tablet Commonly known as:  LIPITOR Take 40 mg by mouth at bedtime.   BASAGLAR KWIKPEN 100 UNIT/ML Sopn Inject 6 Units into the skin at bedtime.   busPIRone 5 MG tablet Commonly known as:  BUSPAR Take 5 mg by mouth 3 (three) times daily.   carvedilol 12.5 MG tablet Commonly known as:  COREG Take 25 mg by mouth 2 (two) times daily.   docusate sodium 100 MG capsule Commonly known as:  COLACE Take 100 mg by mouth daily as needed for mild constipation.   finasteride 5 MG tablet Commonly known as:   PROSCAR Take 5 mg by mouth daily.   FLUoxetine 20 MG capsule Commonly known as:  PROZAC Take 20 mg by mouth daily.   furosemide 40 MG tablet Commonly known as:  LASIX Take 60 mg by mouth daily.   gabapentin 300 MG capsule Commonly known as:  NEURONTIN Take 300 mg by mouth 2 (two) times daily as needed.   hydrALAZINE 10 MG tablet Commonly known as:  APRESOLINE Take 10 mg by mouth as needed.   losartan 50 MG tablet Commonly known as:  COZAAR Take 50 mg by mouth daily.   nitroGLYCERIN 0.4 MG SL tablet Commonly known as:  NITROSTAT Place 0.4 mg under the tongue as needed.   NOVOLOG FLEXPEN 100 UNIT/ML FlexPen Generic drug:  insulin aspart 4 (four) times daily. Sliding scale 150-200 2 units 201-250 4 units 251-300 6 units 301-350 8 units 350-400 10 units   ondansetron 4 MG tablet Commonly known as:  ZOFRAN Take 4 mg by mouth as needed.   pantoprazole 40 MG tablet Commonly known as:  PROTONIX Take 40 mg by mouth daily.   polyethylene glycol powder powder Commonly known as:  GLYCOLAX/MIRALAX Take 17 g by mouth daily.   tamsulosin 0.4 MG Caps capsule Commonly known as:  FLOMAX Take 0.4 mg by mouth daily.   Valproate Sodium 250 MG/5ML Soln solution Commonly known as:  DEPAKENE Take  10 mLs by mouth 2 (two) times daily.      Not on File  The results of significant diagnostics from this hospitalization (including imaging, microbiology, ancillary and laboratory) are listed below for reference.    Significant Diagnostic Studies: Dg Chest 2 View  Result Date: 08/20/2017 CLINICAL DATA:  Left-sided chest pain and palpitations. EXAM: CHEST  2 VIEW COMPARISON:  None. FINDINGS: Cardiomegaly with aortic atherosclerosis. No aortic aneurysm. ICD device projects over the left mid lung with leads in the right atrium and right ventricle. Small focus of airspace opacity seen posteriorly at the left lung base. Pneumonia is not excluded in the setting of adjacent atelectasis. No overt  pulmonary edema. No acute nor suspicious osseous abnormality. Status post median sternotomy. IMPRESSION: Cardiomegaly with aortic atherosclerosis. Patchy opacity at the left lung base posteriorly may reflect a a focal pneumonia in the setting of atelectasis. Electronically Signed   By: Ashley Royalty M.D.   On: 08/20/2017 21:13    Labs: Basic Metabolic Panel:  Recent Labs Lab 08/20/17 2238  NA 136  K 3.7  CL 104  CO2 25  GLUCOSE 170*  BUN 19  CREATININE 1.45*  CALCIUM 8.6*   CBC:  Recent Labs Lab 08/20/17 2238  WBC 10.6*  HGB 10.1*  HCT 30.8*  MCV 80.4  PLT 343   Cardiac Enzymes:  Recent Labs Lab 08/21/17 2133 08/22/17 0349  TROPONINI 0.04* 0.04*     Recent Labs  08/20/17 2238  BNP 639.4*    CBG:  Recent Labs Lab 08/21/17 0752 08/21/17 2233 08/22/17 0752 08/22/17 1238  GLUCAP 163* 157* 107* 122*    Principal Problem:   Hypertensive urgency Active Problems:   DM2 (diabetes mellitus, type 2) ([place])   CKD stage 3 secondary to diabetes ([place])   Adenocarcinoma of rectum, stage 3 ([place])   Hypertensive emergency   Time coordinating discharge: 45 minutes  Signed:  Murray Hodgkins, MD [facility] 08/22/2017, 2:55 PM

## 2017-08-22 NOTE — Progress Notes (Signed)
Nutrition Brief Note  Patient identified on the Malnutrition Screening Tool (MST) Report  Wt Readings from Last 15 Encounters:  08/22/17 220 lb (99.8 kg)   Attempted to see patient and discuss MST report of decreased appetite and wt loss.   Pt is somewhat agitated and confused. He is a poor historian. He says he has lost weight. He says he has a poor intake, but later states he ate well at [employer]. He perseverates on his discharge. He wants his IV taken out "because im leaving". Patient with D/C summary in.   No nutrition interventions warranted at this time. If nutrition issues arise, please consult RD.   Burtis Junes RD, LDN, CNSC Clinical Nutrition Pager: [phone] 08/22/2017 4:13 PM

## 2017-08-23 ENCOUNTER — Other Ambulatory Visit: Payer: Self-pay

## 2017-08-23 DIAGNOSIS — R0789 Other chest pain: Secondary | ICD-10-CM

## 2017-08-23 LAB — GLUCOSE, CAPILLARY
GLUCOSE-CAPILLARY: 142 mg/dL — AB (ref 65–99)
GLUCOSE-CAPILLARY: 157 mg/dL — AB (ref 65–99)
Glucose-Capillary: 82 mg/dL (ref 65–99)
Glucose-Capillary: 190 mg/dL — ABNORMAL HIGH (ref 65–99)

## 2017-08-23 MED ORDER — POLYVINYL ALCOHOL 1.4 % OP SOLN
1.0000 [drp] | OPHTHALMIC | Status: DC | PRN
  Administered 2017-08-23: 1 [drp] via OPHTHALMIC
  Filled 2017-08-23: qty 15

## 2017-08-23 MED ORDER — TRAZODONE HCL 50 MG PO TABS
50.0000 mg | ORAL_TABLET | Freq: Once | ORAL | Status: AC
  Administered 2017-08-23: 50 mg via ORAL
  Filled 2017-08-23: qty 1

## 2017-08-23 NOTE — Progress Notes (Signed)
  PROGRESS NOTE  Shawn Pearson ATF:[id number] DOB: 05-Nov-1940 DOA: 08/20/2017 PCP: Mabeline Caras, NP  Brief Narrative: 76yom presented with chest pain. Admitted for hypertensive urgency, chest pain.  Assessment/Plan Atypical CP. Secondary to hypertensive urgency. Echo in March 2018 which showed normal LV function with EF of 50%, mild-to-moderate LVH with stage I diastolic dysfunction. [place] 2017 had to be aborted secondary to inability to pass catheter. -ACS ruled out. No further evaluation.  Hypertensive urgency resolved with BP meds. -continue anti-hypertensives.  DM with PVD, stable -continue Lantus and gabapentin   Remains stable for discharge. Facility refused to readmit patient yesterday. Social work working on alternative facility. Ready for discharge.  DVT prophylaxis: enoxaparin Code Status: full Family Communication:  Disposition Plan: as above    Murray Hodgkins, MD  [facility] Direct contact: [phone] --[place]ia [place]  --[email or web address]; password [email or web address]  7PM-7AM contact night coverage as above 08/23/2017, 1:57 PM  LOS: 2 days   Consultants:    Procedures:    Antimicrobials:    Interval history/Subjective: Feels ok. No pain.  Objective: Vitals:  Vitals:   08/23/17 0958 08/23/17 1155  BP: (!) 173/61 (!) 150/62  Pulse: 74 72  Resp:  18  Temp:  98 F (36.7 C)  SpO2: 94% 94%    Exam:  Constitutional:  . Appears calm and comfortable Respiratory:  . CTA bilaterally, no w/r/r.  . Respiratory effort normal.  Cardiovascular:  . RRR, no m/r/g  I have personally reviewed the following:  Filed Weights   08/21/17 1530 08/22/17 0419 08/23/17 0412  Weight: 98 kg (216 lb 1.6 oz) 99.8 kg (220 lb) 97.7 kg (215 lb 6.4 oz)   Weight change: -0.318 kg (-11.2 oz)  Labs:  CBG stable  Scheduled Meds: . amLODipine  10 mg Oral Daily  . atorvastatin  40 mg Oral QHS  . busPIRone  5 mg Oral TID  . carvedilol  25 mg Oral BID  . enoxaparin  (LOVENOX) injection  40 mg Subcutaneous Q24H  . feeding supplement (ENSURE ENLIVE)  237 mL Oral BID BM  . finasteride  5 mg Oral Daily  . FLUoxetine  20 mg Oral Daily  . furosemide  60 mg Oral Daily  . insulin aspart  0-9 Units Subcutaneous TID WC  . insulin glargine  6 Units Subcutaneous QHS  . losartan  50 mg Oral Daily  . pantoprazole  40 mg Oral Daily  . polyethylene glycol  17 g Oral Daily  . tamsulosin  0.4 mg Oral Daily  . 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

## 2017-08-23 NOTE — Progress Notes (Signed)
Patient has been having multiple, large bowel movements. Almost every hour. Patient hasn't complained of stomach pain, but does get irritated every time he has a bowel movement. Patient incontinent and skin is very sensitive when cleaning.

## 2017-08-23 NOTE — Clinical Social Work Note (Addendum)
Voicemail for patient's wife not returned yesterday. CSW called again and left another voicemail. Her phone was not turned off today.  Dayton Scrape, [place] [phone]  9:44 am CSW went to patient's room. Patient snoring and did not wake up to CSW calling his name three times. Will try again later. Still no call back from wife.  Dayton Scrape, CSW [phone]  11:00 am CSW spoke with patient and told him that CSW has been unable to reach his wife so far. Patient stated he has not spoken with her today but she is at home. He has not told her that [facility] will not take him back. Patient does not want to go to SNF. He wants to return home. CSW attempted calling patient's wife again. Did not leave a third voicemail. Patient has an ALF PASARR. Will have to begin screening process for SNF PASARR if he is agreeable after CSW talks with his wife. Patient states he was at [facility] for one year prior to transferring to [facility] and cannot return there either. Will try wife again later today.  Dayton Scrape, CSW [phone]  1:15 pm CSW left a third voicemail for patient's wife, emphasizing importance of calling back regarding discharge plan.  Dayton Scrape, Alex

## 2017-08-24 LAB — GLUCOSE, CAPILLARY
Glucose-Capillary: 131 mg/dL — ABNORMAL HIGH (ref 65–99)
Glucose-Capillary: 173 mg/dL — ABNORMAL HIGH (ref 65–99)

## 2017-08-24 NOTE — NC FL2 (Signed)
[place] MEDICAID FL2 LEVEL OF CARE SCREENING TOOL     IDENTIFICATION  Patient Name: Shawn Pearson Birthdate: October 30, 1940 Sex: male Admission Date (Current Location): 08/20/2017  [facility] and [place] Number:  Herbalist and Address:  [facility], [place] [address], [place], [place] [address]      Provider Number: [id number]  Attending Physician Name and Address:  Samuella Cota, MD  Relative Name and Phone Number:       Current Level of Care: Hospital Recommended Level of Care: [employer] Prior Approval Number:    Date Approved/Denied:   PASRR Number: [id number]  Discharge Plan: SNF    Current Diagnoses: Patient Active Problem List   Diagnosis Date Noted  . Hypertensive urgency 08/21/2017  . DM2 (diabetes mellitus, type 2) ([place]) 08/21/2017  . 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 Adenocarcinoma of rectum, stage 3 ([place]) 08/21/2017  . Hypertensive emergency 08/21/2017  . Chest pain     Orientation RESPIRATION BLADDER Height & Weight     Self, Situation, Time, Place  Normal Continent Weight: 214 lb 3.2 oz (97.2 kg) Height:  6\' 2"  (188 cm)(BKA)  BEHAVIORAL SYMPTOMS/MOOD NEUROLOGICAL BOWEL NUTRITION STATUS  Other (Comment)(Irritable, anxious, uncooperative) (None) Continent Diet(Carb modified, low salt)  AMBULATORY STATUS COMMUNICATION OF NEEDS Skin   Total Care Verbally Normal                       Personal Care Assistance Level of Assistance              Functional Limitations Info  Sight, Hearing, Speech Sight Info: Adequate Hearing Info: Adequate Speech Info: Adequate    SPECIAL CARE FACTORS FREQUENCY  Blood pressure, PT (By licensed PT)     PT Frequency: 3 x week              Contractures Contractures Info: Not present    Additional Factors Info  Code Status, Allergies Code Status Info: Full Allergies Info: Not on file           Current Medications (08/24/2017):   This is the current hospital active medication list Current Facility-Administered Medications  Medication Dose Route Frequency Provider Last Rate Last Dose  . acetaminophen (TYLENOL) tablet 650 mg  650 mg Oral Q6H PRN Etta Quill, DO   650 mg at 08/23/17 2306   Or  . acetaminophen (TYLENOL) suppository 650 mg  650 mg Rectal Q6H PRN Etta Quill, DO      . amLODipine (NORVASC) tablet 10 mg  10 mg Oral Daily Jennette Kettle M, DO   10 mg at 08/24/17 8099  . atorvastatin (LIPITOR) tablet 40 mg  40 mg Oral QHS Jennette Kettle M, DO   40 mg at 08/23/17 2111  . busPIRone (BUSPAR) tablet 5 mg  5 mg Oral TID Etta Quill, DO   5 mg at 08/24/17 0932  . carvedilol (COREG) tablet 25 mg  25 mg Oral BID Jennette Kettle M, DO   25 mg at 08/24/17 8338  . enoxaparin (LOVENOX) injection 40 mg  40 mg Subcutaneous Q24H Jennette Kettle M, DO   40 mg at 08/23/17 1714  . feeding supplement (ENSURE ENLIVE) (ENSURE ENLIVE) liquid 237 mL  237 mL Oral BID BM Rosita Fire, MD   237 mL at 08/24/17 0925  . finasteride (PROSCAR) tablet 5 mg  5 mg Oral Daily Alcario Drought, Jared M, DO   5 mg at  08/24/17 0926  . FLUoxetine (PROZAC) capsule 20 mg  20 mg Oral Daily Jennette Kettle M, DO   20 mg at 08/24/17 4944  . furosemide (LASIX) tablet 60 mg  60 mg Oral Daily Etta Quill, DO   60 mg at 08/24/17 9675  . gabapentin (NEURONTIN) capsule 300 mg  300 mg Oral BID PRN Etta Quill, DO   300 mg at 08/23/17 2306  . insulin aspart (novoLOG) injection 0-9 Units  0-9 Units Subcutaneous TID WC Etta Quill, DO   2 Units at 08/24/17 1210  . insulin glargine (LANTUS) injection 6 Units  6 Units Subcutaneous QHS Etta Quill, DO   6 Units at 08/23/17 2121  . losartan (COZAAR) tablet 50 mg  50 mg Oral Daily Jennette Kettle M, DO   50 mg at 08/24/17 9163  . ondansetron (ZOFRAN) tablet 4 mg  4 mg Oral Q6H PRN Etta Quill, DO       Or  . ondansetron [facility]) injection 4 mg  4 mg Intravenous Q6H PRN Etta Quill, DO      . pantoprazole (PROTONIX) EC tablet 40 mg  40 mg Oral Daily Jennette Kettle M, DO   40 mg at 08/24/17 8466  . polyvinyl alcohol (LIQUIFILM TEARS) 1.4 % ophthalmic solution 1 drop  1 drop Both Eyes PRN Samuella Cota, MD   1 drop at 08/23/17 2330  . tamsulosin (FLOMAX) capsule 0.4 mg  0.4 mg Oral Daily Jennette Kettle M, DO   0.4 mg at 08/24/17 5993  . Valproate Sodium (DEPAKENE) solution 500 mg  500 mg Oral BID Etta Quill, DO   500 mg at 08/24/17 5701     Discharge Medications: Please see discharge summary for a list of discharge medications.  Relevant Imaging Results:  Relevant Lab Results:   Additional Information SS#: [id number]  [name], LCSW

## 2017-08-24 NOTE — Progress Notes (Signed)
CSW called patient's wife, Shawn Pearson, again. Wife did not answer and CSW left voicemail, no call back.  CSW met with patient at bedside and discussed discharge planning. Patient indicated he is aware that he is already discharged. CSW informed patient that CSW has been unable to reach patient's wife, despite leaving multiple messages. CSW also reviewed that patient's ALF will not take him back and safest discharge option is for SNF. Patient now agreeable to SNF.  CSW faxed out SNF referrals and is following up with facilities for bed offers. CSW to follow and support with discharge to SNF when bed offer given.  [name], [place]

## 2017-08-24 NOTE — Progress Notes (Addendum)
CSW received bed offers and presented them to patient. Patient chooses Environmental [facility]  ([place]). CSW left another voicemail for patient's wife informing her where patient will be transferred to. Have not received any calls back from wife.  Patient requested help in getting his belongings from [facility]. CSW called ALF; ALF staff indicated patient only has two sets of clothing at their facility and they will bring them over to [facility] for the patient.  Patient will [facility] to [facility] Anticipated discharge date: 08/24/17 Family notified: Jakari Sada, spouse Transportation by: [facility]AR  Nurse to call report to [phone].   CSW signing off.  [name], [place]  Clinical Social Worker

## 2017-08-24 NOTE — Progress Notes (Signed)
Attempted to  See pt and he would not awaken when PT spoke with him.  Can try later as time and pt allow.   08/24/17 1300  PT Visit Information  Last PT Received On 08/24/17  Reason Eval/Treat Not Completed Fatigue/lethargy limiting ability to participate   Mee Hives, PT MS Acute Rehab Dept. Number: [place] and [place]

## 2017-08-24 NOTE — Progress Notes (Signed)
  PROGRESS NOTE  Shawn Pearson NTZ:[id number] DOB: 06/17/1941 DOA: 08/20/2017 PCP: Mabeline Caras, NP  Brief Narrative: 76yom presented with chest pain. Admitted for hypertensive urgency, chest pain.  Assessment/Plan Atypical CP. Secondary to hypertensive urgency. Echo in March 2018 which showed normal LV function with EF of 50%, mild-to-moderate LVH with stage I diastolic dysfunction. [place] 2017 had to be aborted secondary to inability to pass catheter. - remains stable for discharge  Hypertensive urgency resolved with BP meds. - remains stable, continue meds  DM with PVD, stable - remains stable, continue Lantus and gabapentin today   Stable for discharge. No change to discharge summary.  DVT prophylaxis: enoxaparin Code Status: full Family Communication:  Disposition Plan: as above    Murray Hodgkins, MD  [facility] Direct contact: [phone] --Via [place]  --[email or web address]; password [email or web address]  7PM-7AM contact night coverage as above 08/24/2017, 1:28 PM  LOS: 3 days   Consultants:    Procedures:    Antimicrobials:    Interval history/Subjective: No chest pain.  Objective: Vitals:  Vitals:   08/24/17 1150 08/24/17 1200  BP: (!) 137/47 (!) 141/40  Pulse: 73 71  Resp: 18   Temp: 98.8 F (37.1 C) 98.6 F (37 C)  SpO2: 96% 95%    Exam:  Constitutional:  . Appears calm and comfortable Respiratory:  . CTA bilaterally, no w/r/r.  . Respiratory effort normal.  Cardiovascular:  . RRR, no m/r/g  I have personally reviewed the following:  Filed Weights   08/22/17 0419 08/23/17 0412 08/24/17 0548  Weight: 99.8 kg (220 lb) 97.7 kg (215 lb 6.4 oz) 97.2 kg (214 lb 3.2 oz)   Weight change: -0.544 kg (-3.2 oz)  Labs:  CBG remains stable  Scheduled Meds: . amLODipine  10 mg Oral Daily  . atorvastatin  40 mg Oral QHS  . busPIRone  5 mg Oral TID  . carvedilol  25 mg Oral BID  . enoxaparin (LOVENOX) injection  40 mg Subcutaneous Q24H  .  feeding supplement (ENSURE ENLIVE)  237 mL Oral BID BM  . finasteride  5 mg Oral Daily  . FLUoxetine  20 mg Oral Daily  . furosemide  60 mg Oral Daily  . insulin aspart  0-9 Units Subcutaneous TID WC  . insulin glargine  6 Units Subcutaneous QHS  . losartan  50 mg Oral Daily  . pantoprazole  40 mg Oral Daily  . tamsulosin  0.4 mg Oral Daily  . 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

## 2017-08-24 NOTE — Clinical Social Work Placement (Signed)
   CLINICAL SOCIAL WORK PLACEMENT  NOTE  Date:  08/24/2017  Patient Details  Name: Shawn Pearson MRN: [id number] Date of Birth: 07/12/1941  Clinical Social Work is seeking post-discharge placement for this patient at the [place] level of care (*CSW will initial, date and re-position this form in  chart as items are completed):  Yes   Patient/family provided with [facility] Work Department's list of facilities offering this level of care within the geographic area requested by the patient (or if unable, by the patient's family).  Yes   Patient/family informed of their freedom to choose among providers that offer the needed level of care, that participate in Medicare, Medicaid or managed care program needed by the patient, have an available bed and are willing to accept the patient.  Yes   Patient/family informed of [facility]'s ownership interest in [facility] and [facility], as well as of the fact that they are under no obligation to receive care at these facilities.  PASRR submitted to EDS on 08/24/17     PASRR number received on 08/24/17     Existing PASRR number confirmed on       FL2 transmitted to all facilities in geographic area requested by pt/family on 08/24/17     FL2 transmitted to all facilities within larger geographic area on       Patient informed that his/her managed care company has contracts with or will negotiate with certain facilities, including the following:  [facility]([place])         Patient/family informed of bed offers received.  Patient chooses bed at [facility])     Physician recommends and patient chooses bed at      Patient to be transferred to [facility]) on 08/24/17.  Patient to be transferred to facility by PTAR     Patient family notified on 08/24/17 of transfer.  Name of family member notified:  Jhon Mallozzi,  spouse     PHYSICIAN       Additional Comment:    _______________________________________________ [name], LCSW 08/24/2017, 2:49 PM

## 2017-08-30 ENCOUNTER — Other Ambulatory Visit: Payer: Self-pay

## 2017-08-30 ENCOUNTER — Emergency Department (HOSPITAL_COMMUNITY): Payer: Medicare Other

## 2017-08-30 ENCOUNTER — Encounter (HOSPITAL_COMMUNITY): Payer: Self-pay

## 2017-08-30 ENCOUNTER — Observation Stay (HOSPITAL_COMMUNITY)
Admission: EM | Admit: 2017-08-30 | Discharge: 2017-09-01 | Disposition: A | Discharge status: Home / Self Care | Payer: Medicare Other | Attending: Internal Medicine | Admitting: Internal Medicine

## 2017-08-30 DIAGNOSIS — N183 Chronic kidney disease, stage 3 (moderate): Secondary | ICD-10-CM | POA: Insufficient documentation

## 2017-08-30 DIAGNOSIS — Z79899 Other long term (current) drug therapy: Secondary | ICD-10-CM | POA: Diagnosis not present

## 2017-08-30 DIAGNOSIS — E876 Hypokalemia: Secondary | ICD-10-CM | POA: Diagnosis not present

## 2017-08-30 DIAGNOSIS — I255 Ischemic cardiomyopathy: Secondary | ICD-10-CM | POA: Insufficient documentation

## 2017-08-30 DIAGNOSIS — E1159 Type 2 diabetes mellitus with other circulatory complications: Secondary | ICD-10-CM | POA: Diagnosis not present

## 2017-08-30 DIAGNOSIS — I129 Hypertensive chronic kidney disease with stage 1 through stage 4 chronic kidney disease, or unspecified chronic kidney disease: Secondary | ICD-10-CM | POA: Diagnosis not present

## 2017-08-30 DIAGNOSIS — E1122 Type 2 diabetes mellitus with diabetic chronic kidney disease: Secondary | ICD-10-CM | POA: Insufficient documentation

## 2017-08-30 DIAGNOSIS — D649 Anemia, unspecified: Principal | ICD-10-CM | POA: Diagnosis present

## 2017-08-30 DIAGNOSIS — E119 Type 2 diabetes mellitus without complications: Secondary | ICD-10-CM

## 2017-08-30 DIAGNOSIS — F0390 Unspecified dementia without behavioral disturbance: Secondary | ICD-10-CM | POA: Diagnosis not present

## 2017-08-30 DIAGNOSIS — I159 Secondary hypertension, unspecified: Secondary | ICD-10-CM | POA: Diagnosis not present

## 2017-08-30 DIAGNOSIS — Z794 Long term (current) use of insulin: Secondary | ICD-10-CM | POA: Insufficient documentation

## 2017-08-30 DIAGNOSIS — R079 Chest pain, unspecified: Secondary | ICD-10-CM | POA: Diagnosis present

## 2017-08-30 DIAGNOSIS — I10 Essential (primary) hypertension: Secondary | ICD-10-CM | POA: Diagnosis present

## 2017-08-30 HISTORY — DX: Unspecified dementia, unspecified severity, without behavioral disturbance, psychotic disturbance, mood disturbance, and anxiety: F03.90

## 2017-08-30 HISTORY — DX: Disorder of kidney and ureter, unspecified: N28.9

## 2017-08-30 HISTORY — DX: Personal history of other diseases of the circulatory system: Z86.79

## 2017-08-30 HISTORY — DX: Atherosclerosis of aorta: I70.0

## 2017-08-30 LAB — RETICULOCYTES
RBC.: 3.26 MIL/uL — AB (ref 4.22–5.81)
Retic Count, Absolute: 29.3 10*3/uL (ref 19.0–186.0)
Retic Ct Pct: 0.9 % (ref 0.4–3.1)

## 2017-08-30 LAB — BASIC METABOLIC PANEL
Anion gap: 5 (ref 5–15)
BUN: 13 mg/dL (ref 6–20)
CALCIUM: 7.9 mg/dL — AB (ref 8.9–10.3)
CO2: 22 mmol/L (ref 22–32)
Chloride: 106 mmol/L (ref 101–111)
Creatinine, Ser: 1.38 mg/dL — ABNORMAL HIGH (ref 0.61–1.24)
GFR calc Af Amer: 55 mL/min — ABNORMAL LOW (ref >60)
GFR, EST NON AFRICAN AMERICAN: 48 mL/min — AB (ref >60)
Glucose, Bld: 160 mg/dL — ABNORMAL HIGH (ref 65–99)
POTASSIUM: 3.6 mmol/L (ref 3.5–5.1)
SODIUM: 133 mmol/L — AB (ref 135–145)

## 2017-08-30 LAB — GLUCOSE, CAPILLARY: Glucose-Capillary: 272 mg/dL — ABNORMAL HIGH (ref 65–99)

## 2017-08-30 LAB — FERRITIN: FERRITIN: 191 ng/mL (ref 24–336)

## 2017-08-30 LAB — CBC
HEMATOCRIT: 26.9 % — AB (ref 39.0–52.0)
Hemoglobin: 8.7 g/dL — ABNORMAL LOW (ref 13.0–17.0)
MCH: 25.8 pg — ABNORMAL LOW (ref 26.0–34.0)
MCHC: 32.3 g/dL (ref 30.0–36.0)
MCV: 79.8 fL (ref 78.0–100.0)
PLATELETS: 354 10*3/uL (ref 150–400)
RBC: 3.37 MIL/uL — ABNORMAL LOW (ref 4.22–5.81)
RDW: 14.4 % (ref 11.5–15.5)
WBC: 8.2 10*3/uL (ref 4.0–10.5)

## 2017-08-30 LAB — IRON AND TIBC
Iron: 18 ug/dL — ABNORMAL LOW (ref 45–182)
SATURATION RATIOS: 7 % — AB (ref 17.9–39.5)
TIBC: 252 ug/dL (ref 250–450)
UIBC: 234 ug/dL

## 2017-08-30 LAB — FOLATE: FOLATE: 9 ng/mL (ref >5.9)

## 2017-08-30 LAB — POC OCCULT BLOOD, ED: Fecal Occult Bld: NEGATIVE

## 2017-08-30 LAB — MRSA PCR SCREENING: MRSA by PCR: NEGATIVE

## 2017-08-30 LAB — VITAMIN B12: Vitamin B-12: 335 pg/mL (ref 180–914)

## 2017-08-30 MED ORDER — ACETAMINOPHEN 500 MG PO TABS
500.0000 mg | ORAL_TABLET | Freq: Once | ORAL | Status: DC
Start: 2017-08-30 — End: 2017-09-01
  Filled 2017-08-30: qty 1

## 2017-08-30 MED ORDER — TAMSULOSIN HCL 0.4 MG PO CAPS
0.4000 mg | ORAL_CAPSULE | Freq: Every day | ORAL | Status: DC
  Administered 2017-08-30 – 2017-08-31 (×2): 0.4 mg via ORAL
  Filled 2017-08-30 (×2): qty 1

## 2017-08-30 MED ORDER — INSULIN ASPART 100 UNIT/ML ~~LOC~~ SOLN
0.0000 [IU] | Freq: Three times a day (TID) | SUBCUTANEOUS | Status: DC
  Administered 2017-08-31 (×2): 2 [IU] via SUBCUTANEOUS
  Administered 2017-08-31: 3 [IU] via SUBCUTANEOUS

## 2017-08-30 MED ORDER — VALPROATE SODIUM 250 MG/5ML PO SOLN
500.0000 mg | Freq: Two times a day (BID) | ORAL | Status: DC
  Administered 2017-08-31: 500 mg via ORAL
  Filled 2017-08-30 (×5): qty 10

## 2017-08-30 MED ORDER — AMLODIPINE BESYLATE 5 MG PO TABS
10.0000 mg | ORAL_TABLET | Freq: Every day | ORAL | Status: DC
  Administered 2017-08-30 – 2017-08-31 (×2): 10 mg via ORAL
  Filled 2017-08-30 (×3): qty 2

## 2017-08-30 MED ORDER — BOOST / RESOURCE BREEZE PO LIQD
1.0000 | Freq: Three times a day (TID) | ORAL | Status: DC
  Administered 2017-08-31 (×2): 1 via ORAL

## 2017-08-30 MED ORDER — POLYETHYLENE GLYCOL 3350 17 GM/SCOOP PO POWD
17.0000 g | Freq: Every day | ORAL | Status: DC
  Filled 2017-08-30: qty 255

## 2017-08-30 MED ORDER — LOSARTAN POTASSIUM 50 MG PO TABS
50.0000 mg | ORAL_TABLET | Freq: Every day | ORAL | Status: DC
  Administered 2017-08-30 – 2017-08-31 (×2): 50 mg via ORAL
  Filled 2017-08-30 (×3): qty 1

## 2017-08-30 MED ORDER — FUROSEMIDE 20 MG PO TABS
60.0000 mg | ORAL_TABLET | Freq: Every day | ORAL | Status: DC
  Administered 2017-08-30 – 2017-08-31 (×2): 60 mg via ORAL
  Filled 2017-08-30 (×3): qty 3

## 2017-08-30 MED ORDER — ONDANSETRON HCL 4 MG PO TABS: 4.0000 mg | ORAL_TABLET | ORAL | Status: DC | PRN

## 2017-08-30 MED ORDER — FLUOXETINE HCL 20 MG PO CAPS
20.0000 mg | ORAL_CAPSULE | Freq: Every day | ORAL | Status: DC
  Administered 2017-08-30 – 2017-08-31 (×2): 20 mg via ORAL
  Filled 2017-08-30 (×3): qty 1

## 2017-08-30 MED ORDER — CARVEDILOL 12.5 MG PO TABS
12.5000 mg | ORAL_TABLET | Freq: Two times a day (BID) | ORAL | Status: DC
  Administered 2017-08-30 – 2017-08-31 (×3): 12.5 mg via ORAL
  Filled 2017-08-30: qty 4
  Filled 2017-08-30 (×3): qty 1

## 2017-08-30 MED ORDER — RISPERIDONE MICROSPHERES 25 MG IM SUSR: 1.0000 mg | Freq: Every day | INTRAMUSCULAR | Status: DC

## 2017-08-30 MED ORDER — PANTOPRAZOLE SODIUM 40 MG PO TBEC
40.0000 mg | DELAYED_RELEASE_TABLET | Freq: Every day | ORAL | Status: DC
  Administered 2017-08-31: 40 mg via ORAL
  Filled 2017-08-30 (×2): qty 1

## 2017-08-30 MED ORDER — SODIUM CHLORIDE 0.9 % IV SOLN
INTRAVENOUS | Status: AC
  Administered 2017-08-30: 20:00:00 via INTRAVENOUS

## 2017-08-30 MED ORDER — TRAMADOL HCL 50 MG PO TABS
50.0000 mg | ORAL_TABLET | Freq: Four times a day (QID) | ORAL | Status: AC | PRN
  Administered 2017-08-30 – 2017-09-01 (×2): 50 mg via ORAL
  Filled 2017-08-30 (×2): qty 1

## 2017-08-30 MED ORDER — LOSARTAN POTASSIUM 50 MG PO TABS
50.0000 mg | ORAL_TABLET | Freq: Once | ORAL | Status: AC
  Administered 2017-08-30: 50 mg via ORAL
  Filled 2017-08-30: qty 1

## 2017-08-30 MED ORDER — HYDRALAZINE HCL 10 MG PO TABS
10.0000 mg | ORAL_TABLET | ORAL | Status: DC | PRN
  Administered 2017-08-30: 10 mg via ORAL
  Filled 2017-08-30: qty 1

## 2017-08-30 MED ORDER — DOCUSATE SODIUM 100 MG PO CAPS: 100.0000 mg | ORAL_CAPSULE | Freq: Every day | ORAL | Status: DC | PRN

## 2017-08-30 MED ORDER — AMLODIPINE BESYLATE 5 MG PO TABS
5.0000 mg | ORAL_TABLET | Freq: Once | ORAL | Status: AC
  Administered 2017-08-30: 5 mg via ORAL
  Filled 2017-08-30: qty 1

## 2017-08-30 MED ORDER — ATORVASTATIN CALCIUM 40 MG PO TABS
40.0000 mg | ORAL_TABLET | Freq: Every day | ORAL | Status: DC
  Administered 2017-08-30 – 2017-08-31 (×2): 40 mg via ORAL
  Filled 2017-08-30 (×2): qty 1

## 2017-08-30 MED ORDER — FINASTERIDE 5 MG PO TABS
5.0000 mg | ORAL_TABLET | Freq: Every day | ORAL | Status: DC
  Administered 2017-08-30 – 2017-08-31 (×2): 5 mg via ORAL
  Filled 2017-08-30 (×3): qty 1

## 2017-08-30 MED ORDER — INSULIN GLARGINE 100 UNIT/ML ~~LOC~~ SOLN
6.0000 [IU] | Freq: Every day | SUBCUTANEOUS | Status: DC
  Administered 2017-08-30 – 2017-08-31 (×2): 6 [IU] via SUBCUTANEOUS
  Filled 2017-08-30 (×3): qty 0.06

## 2017-08-30 MED ORDER — INSULIN ASPART 100 UNIT/ML ~~LOC~~ SOLN
0.0000 [IU] | Freq: Every day | SUBCUTANEOUS | Status: DC
  Administered 2017-08-30: 3 [IU] via SUBCUTANEOUS

## 2017-08-30 MED ORDER — GABAPENTIN 300 MG PO CAPS
300.0000 mg | ORAL_CAPSULE | Freq: Two times a day (BID) | ORAL | Status: DC | PRN
  Administered 2017-08-30: 300 mg via ORAL
  Filled 2017-08-30: qty 1

## 2017-08-30 MED ORDER — NITROGLYCERIN 0.4 MG SL SUBL: 0.4000 mg | SUBLINGUAL_TABLET | SUBLINGUAL | Status: DC | PRN

## 2017-08-30 MED ORDER — POLYETHYLENE GLYCOL 3350 17 G PO PACK
17.0000 g | PACK | Freq: Every day | ORAL | Status: DC
  Administered 2017-08-31: 17 g via ORAL
  Filled 2017-08-30 (×4): qty 1

## 2017-08-30 MED ORDER — BUSPIRONE HCL 10 MG PO TABS
5.0000 mg | ORAL_TABLET | Freq: Three times a day (TID) | ORAL | Status: DC
  Administered 2017-08-30 – 2017-08-31 (×4): 5 mg via ORAL
  Filled 2017-08-30 (×5): qty 1

## 2017-08-30 MED ORDER — IPRATROPIUM-ALBUTEROL 0.5-2.5 (3) MG/3ML IN SOLN
3.0000 mL | Freq: Once | RESPIRATORY_TRACT | Status: AC
  Administered 2017-08-30: 3 mL via RESPIRATORY_TRACT
  Filled 2017-08-30: qty 3

## 2017-08-30 NOTE — ED Notes (Signed)
Pt reports HA at this time. Pt with no mention of CP at this time.

## 2017-08-30 NOTE — ED Notes (Signed)
Patient transported to X-ray 

## 2017-08-30 NOTE — ED Notes (Signed)
Attempted report x1. 

## 2017-08-30 NOTE — ED Provider Notes (Signed)
Received signout from [employer].  Refer to provider note for full history and physical examination.  Briefly, patient is a 76 year old male with history of rectal adenocarcinoma not amenable to surgery, CAD, diabetes mellitus, PAD status post bilateral BKA, HTN who presents today with central, constant chest pain.  He was recently admitted with reassuring workup and discharged back to his nursing facility August 22, 2017.  He has been refusing medications or food at the nursing facility.  Today, Trop 0.10 but he has chronic elevation.  Remainder of lab work is otherwise unremarkable, but he does have a two-point drop in his hemoglobin as compared to last lab work 10 days ago.  Hemoccult negative, however  with history of rectal adenocarcinoma, may require admission for serial CBCs and observation.  4:50PM Spoke with Santiago Glad with [facility] who agrees to assume care of patient and bring him in to the hospital for further workup and observation. Patient resting comfortably in NAD.    Renita Papa, PA-C 08/30/17 1811    Carmin Muskrat, MD 08/30/17 [phone]

## 2017-08-30 NOTE — ED Notes (Signed)
Lunch tray ordered 

## 2017-08-30 NOTE — ED Triage Notes (Signed)
Pt from [facility] via EMS for sharp L sided CP that began last night. Pt denies radiation. Per EMS, pt uncooperative and would not answer any questions en route. Pt with hx of dementia and tendency to become combative per staff. Pt given 3 NTG by [facility] staff and 324 ASA en route. Pt also reports HA. Pt A&Ox4 at this time. Pt with ICD. EMS VS: 177/87, 76 HR, 96% on RA

## 2017-08-30 NOTE — ED Notes (Signed)
Attempted report x 2. Pt room changed from 5W to [place]

## 2017-08-30 NOTE — H&P (Signed)
History and Physical    Zavier Pearson OFB:[id number] DOB: Feb 22, 1941 DOA: 08/30/2017  PCP: Mabeline Caras, NP Patient coming from: home  Chief Complaint: chest pain  HPI: Shawn Pearson is a 76 y.o. male with medical history significant chronic kidney disease, hypertension, diabetes, dementia, chronic chest pain CAD, ICD placement, stage III rectal since emergency department from her facility with the chief complaint of chest pain cancer. Initial evaluation reveals worsening anemia. [facility] are asked to admit  Information is obtained from the chart and the patient noting that information from patient may be unreliable due to dementia. Chart indicates he is noncompliant with medications at facility. Blood pressure becomes elevated and he develops chest pain and comes to the emergency department. He has had multiple presentations and a few admissions rule out chest pain. He has stress test in February negative. He was discharged 9 days ago for chest pain rule out. He denies headache dizziness syncope or near-syncope. He denies shortness of breath diaphoresis nausea vomiting. He denies diarrhea constipation melena bright red blood per rectum. He denies dysuria hematuria frequency or urgency.    ED Course: In the emergency department his blood pressure is elevated he's afebrile and otherwise hemodynamically stable he's not hypoxic. He is provided with a beta blocker  Review of Systems: As per HPI otherwise all other systems reviewed and are negative.   Ambulatory Status: Bilateral below-the-knee amputations.  Past Medical History:  Diagnosis Date  . Aortic atherosclerosis ([place])   . Dementia   . Diabetes mellitus without complication ([place])   . History of angina   . Hypertension   . Renal disorder    CKD stage 3    Past Surgical History:  Procedure Laterality Date  . BELOW KNEE LEG AMPUTATION Right     Social History   Socioeconomic History  . Marital status: Widowed      Spouse name: Not on file  . Number of children: Not on file  . Years of education: Not on file  . Highest education level: Not on file  Social Needs  . Financial resource strain: Not on file  . Food insecurity - worry: Not on file  . Food insecurity - inability: Not on file  . Transportation needs - medical: Not on file  . Transportation needs - non-medical: Not on file  Occupational History  . Not on file  Tobacco Use  . Smoking status: Unknown If Ever Smoked  . Smokeless tobacco: Never Used  Substance and Sexual Activity  . Alcohol use: No  . Drug use: No  . Sexual activity: No  Other Topics Concern  . Not on file  Social History Narrative  . Not on file    No Known Allergies  No family history on file.  Prior to Admission medications   Medication Sig Start Date End Date Taking? Authorizing Provider  amLODipine (NORVASC) 10 MG tablet Take 10 mg by mouth daily.   Yes [provider]  atorvastatin (LIPITOR) 40 MG tablet Take 40 mg by mouth at bedtime. 03/30/17 03/30/18 Yes [provider]  busPIRone (BUSPAR) 5 MG tablet Take 5 mg by mouth 3 (three) times daily.   Yes [provider]  carvedilol (COREG) 25 MG tablet Take 25 mg 2 (two) times daily with a meal by mouth.  10/07/13  Yes [provider]  cloNIDine (CATAPRES - DOSED IN MG/24 HR) 0.1 mg/24hr patch Place 0.1 mg once a week onto the skin. THURSDAY 09/03/17  Yes [provider]  docusate sodium (COLACE) 100 MG capsule Take 100 mg by mouth daily as needed for mild constipation.   Yes [provider]  finasteride (PROSCAR) 5 MG tablet Take 5 mg by mouth daily.   Yes [provider]  FLUoxetine (PROZAC) 20 MG capsule Take 20 mg by mouth daily. 08/13/17  Yes [provider]  furosemide (LASIX) 40 MG tablet Take 60 mg by mouth daily. 08/13/17  Yes [provider]  gabapentin (NEURONTIN) 300 MG capsule Take 300 mg 2 (two) times daily as needed  by mouth (chronic pain).  08/13/17  Yes [provider]  hydrALAZINE (APRESOLINE) 10 MG tablet Take 10 mg as needed by mouth (HTN).  08/13/17  Yes [provider]  Insulin Glargine (BASAGLAR KWIKPEN) 100 UNIT/ML SOPN Inject 6 Units into the skin at bedtime. 03/30/17  Yes [provider]  losartan (COZAAR) 50 MG tablet Take 50 mg by mouth daily.   Yes [provider]  nitroGLYCERIN (NITROSTAT) 0.4 MG SL tablet Place 0.4 mg as needed under the tongue for chest pain.  08/13/17  Yes [provider]  NOVOLOG FLEXPEN 100 UNIT/ML FlexPen 4 (four) times daily. Sliding scale 150-200 2 units 201-250 4 units 251-300 6 units 301-350 8 units 350-400 10 units 08/13/17  Yes [provider]  ondansetron (ZOFRAN) 4 MG tablet Take 4 mg as needed by mouth for nausea or vomiting.    Yes [provider]  pantoprazole (PROTONIX) 40 MG tablet Take 40 mg by mouth daily. 08/13/17  Yes [provider]  polyethylene glycol powder (GLYCOLAX/MIRALAX) powder Take 17 g by mouth daily. 08/13/17  Yes Forde Dandy, MD  RISPERIDONE MICROSPHERES IM Inject 1 mg daily into the muscle. For three days 08/29/17 08/31/17 Yes [provider]  Valproate Sodium (DEPAKENE) 250 MG/5ML SOLN solution Take 10 mLs by mouth 2 (two) times daily.   Yes [provider]  tamsulosin (FLOMAX) 0.4 MG CAPS capsule Take 0.4 mg daily after supper by mouth.  08/13/17   [provider]    Physical Exam: Vitals:   08/30/17 1600 08/30/17 1615 08/30/17 1630 08/30/17 1645  BP: (!) 176/73 (!) 160/79 (!) 164/92 (!) 169/83  Pulse: 71 74 74 76  Resp: 20 (!) 28 (!) 27 20  Temp:      TempSrc:      SpO2: 97% 98% 99% 96%  Weight:      Height:         General:  Appears calm and comfortable obese lying on his side eating dinner and watching TV Eyes:  PERRL, EOMI, normal lids, iris ENT:  grossly normal hearing, lips & tongue, his membranes of his mouth are moist  and pink. Very poor dentition Neck:  no LAD, masses or thyromegaly Cardiovascular:  RRR, no m/r/g. Bilateral below the knee amputation Respiratory:  CTA bilaterally, no w/r/r. Normal respiratory effort. Abdomen:  soft, ntnd, obese positive bowel sounds no guarding or rebounding Skin:  no rash or induration seen on limited exam Musculoskeletal:  grossly normal tone BUE/BLE, good ROM, no bony abnormality Psychiatric:  grossly normal mood and affect, speech fluent and appropriate, AOx3 Neurologic:  CN 2-12 grossly intact, moves all extremities in coordinated fashion, sensation intact each clear facial symmetry responds appropriately to questions and commands  Labs on Admission: I have personally reviewed following labs and imaging studies  CBC: Recent Labs  Lab 08/30/17 1429  WBC 8.2  HGB 8.7*  HCT 26.9*  MCV 79.8  PLT 354  Basic Metabolic Panel: Recent Labs  Lab 08/30/17 1429  NA 133*  K 3.6  CL 106  CO2 22  GLUCOSE 160*  BUN 13  CREATININE 1.38*  CALCIUM 7.9*   GFR: Estimated Creatinine Clearance: 52.1 mL/min (A) (by C-G formula based on SCr of 1.38 mg/dL (H)). Liver Function Tests: No results for input(s): AST, ALT, ALKPHOS, BILITOT, PROT, ALBUMIN in the last 168 hours. No results for input(s): LIPASE, AMYLASE in the last 168 hours. No results for input(s): AMMONIA in the last 168 hours. Coagulation Profile: No results for input(s): INR, PROTIME in the last 168 hours. Cardiac Enzymes: No results for input(s): CKTOTAL, CKMB, CKMBINDEX, TROPONINI in the last 168 hours. BNP (last 3 results) No results for input(s): PROBNP in the last 8760 hours. HbA1C: No results for input(s): HGBA1C in the last 72 hours. CBG: Recent Labs  Lab 08/23/17 2120 08/24/17 0755 08/24/17 1148  GLUCAP 190* 131* 173*   Lipid Profile: No results for input(s): CHOL, HDL, LDLCALC, TRIG, CHOLHDL, LDLDIRECT in the last 72 hours. Thyroid Function Tests: No results for input(s): TSH, T4TOTAL,  FREET4, T3FREE, THYROIDAB in the last 72 hours. Anemia Panel: No results for input(s): VITAMINB12, FOLATE, FERRITIN, TIBC, IRON, RETICCTPCT in the last 72 hours. Urine analysis: No results found for: COLORURINE, APPEARANCEUR, LABSPEC, PHURINE, GLUCOSEU, HGBUR, BILIRUBINUR, KETONESUR, PROTEINUR, UROBILINOGEN, NITRITE, LEUKOCYTESUR  Creatinine Clearance: Estimated Creatinine Clearance: 52.1 mL/min (A) (by C-G formula based on SCr of 1.38 mg/dL (H)).  Sepsis Labs: @LABRCNTIP (procalcitonin:4,lacticidven:4) )No results found for this or any previous visit (from the past 240 hour(s)).   Radiological Exams on Admission: Dg Chest 2 View  Result Date: 08/30/2017 CLINICAL DATA:  Left-sided chest pain beginning last night. EXAM: CHEST  2 VIEW COMPARISON:  08/20/2017 FINDINGS: Sternotomy wires and left-sided pacemaker unchanged. Lungs are hypoinflated with minimal posterior left basilar opacification without significant change and be due to small amount of pleural fluid with atelectasis although infection is less likely. Minimal prominence of the perihilar markings unchanged. Mild stable cardiomegaly. Calcified plaque is present over the thoracic aorta. Remainder of the exam is unchanged. IMPRESSION: Stable mild opacification of the posterior left base likely small effusion with atelectasis, although infection is less likely. Mild stable cardiomegaly with suggestion of minimal vascular congestion. Electronically Signed   By: Marin Olp M.D.   On: 08/30/2017 15:59    EKG: paced rhythm without changes  Assessment/Plan Principal Problem:   Anemia Active Problems:   DM2 (diabetes mellitus, type 2) ([facility])   CKD stage 3 secondary to diabetes ([place])   Chest pain   Hypertension   1. Anemia. Hemoglobin 8.7. He has a history of iron deficiency anemia and chronic kidney disease. Last hemoglobin in our system was 10.1 10 days ago. Patient does have a history of a colorectal mass in the process of following  up with surgery and [place]. FOBT negative. No shortness of breath no tachycardia she is hypertensive -Admit to telemetry for observation -Stain an anemia panel -Serial CBCs -Discharge tomorrow with outpatient follow-up if hemoglobin stable  #2. Chest pain. Resolved at the point of admission. Chest x-ray with stable mild opacification of posterior left base likely small effusion with atelectasis. Mild stable cardiomegaly with suggestion of minimal vascular congestion. EKG without acute changes. Troponin negative Patient with a history of multiple episodes/presentations/admissions a chest pain. Presumably related to hypertensive emergency and urgency that he tends to develop in the setting of noncompliance with his antihypertensive medications. Echo in March 2018 showed normal LV function EF  of 50%, mild to moderate LVH with stage I diastolic dysfunction. Left heart cath in 2017 not completed there was inability to pass the catheter. His troponins typically are minimally elevated and flat. EKG nonacute. No evidence of ACS. No further evaluation recommended -Monitor on telemetry -Continue home meds -Improved blood pressure control -follow up with Dr Burt Kn[facility] at [facility]  #3. Hypertension. Uncontrolled in the emergency department secondary to noncompliance. Was given a beta blocker in the emergency department starting to improve. Home medications include clonidine patch, Norvasc, Coreg, Lasix, hydralazine, losartan. -Resume these medications -Monitor blood pressure  #4. Diabetes. Type II. Serum glucose 160 on admission. Home medications include long-acting insulin as well as sliding scale -Continue long-acting insulin -Sliding scale for optimal control -Carb modified diet  #5. Chronic kidney disease stage III. Creatinine 1.3 which is close to baseline. -Hold nephrotoxins -Gentle IV fluids -Recheck in the morning     DVT prophylaxis: lovenox  Code Status: full  Family Communication: none  present  Disposition Plan: back to facility  Consults called: none  Admission status: obs    Radene Gunning MD [facility]  If 7PM-7AM, please contact night-coverage [email or web address] Password [email or web address]  08/30/2017, 4:53 PM

## 2017-08-30 NOTE — ED Notes (Signed)
Pt finished eating his lunch tray

## 2017-08-30 NOTE — ED Notes (Addendum)
Occult card at the bedside. EDP aware. EDP notified pt refused tylenol

## 2017-08-30 NOTE — ED Notes (Signed)
Shawn Pearson PostPA shown results of Istat troponin.ED=Lab

## 2017-08-30 NOTE — ED Notes (Signed)
Admitting at the bedside.  

## 2017-08-30 NOTE — ED Notes (Signed)
Pt wife reached by this RN. Pt given phone.

## 2017-08-30 NOTE — ED Notes (Addendum)
Pt appears agitated and states "I want to get ahold of my wife!" This RN has attempted to call pt wife mulitple times with no response. When attempting to leave voice message, pt's wife mailbox full. Will continue to try. Pt aware.

## 2017-08-30 NOTE — ED Provider Notes (Signed)
[place] EMERGENCY DEPARTMENT Provider Note   CSN: [id number] Arrival date & time: 08/30/17  1409     History   Chief Complaint Chief Complaint  Patient presents with  . Chest Pain    HPI Shawn Pearson is a 76 y.o. 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 No fevers, cough, SOB, palpitations, light-headedness, orthopnea, PND, nausea, vomiting, abdominal or back pain. States he has been refusing medications and food at nursing facility. He wants to live back at home.  Recent admission for CP, hypertensive urgency and small infiltrate on CXR.   Chart reveals pt was admitted for hypertensive urgency and CP on May 2018. He developed hematochezia and found to have rectal mass with ulceration and multiple bleeding sources. He is no s/p APC ablation. He was discharged with coloreectal surgeon (Dr. Leland Johns). No planned surgery of plan for further staging> Pt not a candidate for systemic therapy and medically inoperable. Plan was to do palliative RT, per last discharge note at [facility]. Today he denies diarrhea, melena, hematochezia. He has intermittent pain with BM. No anticoagulants.   HPI  Past Medical History:  Diagnosis Date  . Aortic atherosclerosis ([place])   . Dementia   . Diabetes mellitus without complication ([place])   . History of angina   . Hypertension   . Renal disorder    CKD stage 3    Patient Active Problem List   Diagnosis Date Noted  . Hypertensive urgency 08/21/2017  . DM2 (diabetes mellitus, type 2) ([place]) 08/21/2017  . CKD stage 3 secondary to diabetes ([place]) 08/21/2017  . Adenocarcinoma of rectum, stage 3 ([place]) 08/21/2017  . Hypertensive emergency 08/21/2017  . Chest pain     Past Surgical History:  Procedure Laterality Date  . BELOW KNEE LEG  AMPUTATION Right        Home Medications    Prior to Admission medications   Medication Sig Start Date End Date Taking? Authorizing Provider  amLODipine (NORVASC) 5 MG tablet Take 10 mg by mouth daily.    [provider]  atorvastatin (LIPITOR) 40 MG tablet Take 40 mg by mouth at bedtime. 03/30/17 03/30/18  [provider]  busPIRone (BUSPAR) 5 MG tablet Take 5 mg by mouth 3 (three) times daily.    [provider]  carvedilol (COREG) 12.5 MG tablet Take 25 mg by mouth 2 (two) times daily. 10/07/13   [provider]  docusate sodium (COLACE) 100 MG capsule Take 100 mg by mouth daily as needed for mild constipation.    [provider]  finasteride (PROSCAR) 5 MG tablet Take 5 mg by mouth daily.    [provider]  FLUoxetine (PROZAC) 20 MG capsule Take 20 mg by mouth daily. 08/13/17   [provider]  furosemide (LASIX) 40 MG tablet Take 60 mg by mouth daily. 08/13/17   [provider]  gabapentin (NEURONTIN) 300 MG capsule Take 300 mg by mouth 2 (two) times daily as needed. 08/13/17   [provider]  hydrALAZINE (APRESOLINE) 10 MG tablet Take 10 mg by mouth as needed. 08/13/17   [provider]  Insulin Glargine (BASAGLAR KWIKPEN) 100 UNIT/ML SOPN Inject 6 Units into the skin at bedtime. 03/30/17   [provider]  losartan (COZAAR) 50 MG tablet Take 50 mg  by mouth daily.    [provider]  nitroGLYCERIN (NITROSTAT) 0.4 MG SL tablet Place 0.4 mg under the tongue as needed. 08/13/17   [provider]  NOVOLOG FLEXPEN 100 UNIT/ML FlexPen 4 (four) times daily. Sliding scale 150-200 2 units 201-250 4 units 251-300 6 units 301-350 8 units 350-400 10 units 08/13/17   [provider]  ondansetron (ZOFRAN) 4 MG tablet Take 4 mg by mouth as needed.    [provider]  pantoprazole (PROTONIX) 40 MG tablet Take 40 mg by mouth daily. 08/13/17   [provider]  polyethylene glycol powder (GLYCOLAX/MIRALAX) powder Take 17 g by mouth daily. 08/13/17   Forde Dandy, MD  tamsulosin (FLOMAX) 0.4 MG CAPS capsule Take 0.4 mg by mouth daily. 08/13/17   [provider]  Valproate Sodium (DEPAKENE) 250 MG/5ML SOLN solution Take 10 mLs by mouth 2 (two) times daily.    [provider]    Family History No family history on file.  Social History Social History   Tobacco Use  . Smoking status: Unknown If Ever Smoked  . Smokeless tobacco: Never Used  Substance Use Topics  . Alcohol use: No  . Drug use: No     Allergies   Patient has no known allergies.   Review of Systems Review of Systems  Cardiovascular: Positive for chest pain.  Allergic/Immunologic: Positive for immunocompromised state.  All other systems reviewed and are negative.    Physical Exam Updated Vital Signs BP (!) 160/79   Pulse 74   Temp 99.6 F (37.6 C) (Oral)   Resp (!) 28   Ht 6\' 2"  (1.88 m)   Wt 97.1 kg (214 lb)   SpO2 98%   BMI 27.48 kg/m   Physical Exam  Constitutional: He is oriented to person, place, and time. He appears well-developed and well-nourished.  NAD.  HENT:  Head: Normocephalic and atraumatic.  Right Ear: External ear normal.  Left Ear: External ear normal.  Nose: Nose normal.  Eyes: Conjunctivae and EOM are normal. No scleral icterus.  Neck: Normal range of motion. Neck supple.  Cardiovascular: Normal rate, regular rhythm, normal heart sounds and intact distal pulses.  No murmur heard. Pulses:      Radial pulses are 1+ on the right side, and 1+ on the left side.  1+ popliteal pulses bilaterally  Pulmonary/Chest: Effort normal and breath sounds normal. He has no decreased breath sounds. He has no wheezes.  Abdominal: Soft. There is no tenderness.  Genitourinary: Rectal exam shows external hemorrhoid.  Genitourinary Comments: Chaperone was present.  Patient with mild pain with DR Non tender external hemorrhoid at 6  o'clock  No induration or swelling of the perianal skin.   Stool color is brown with no gross blood noted.   No signs of perirectal abscess.   DRE reveals good sphincter tone.    Musculoskeletal: Normal range of motion. He exhibits no deformity.  Neurological: He is alert and oriented to person, place, and time.  Skin: Skin is warm and dry. Capillary refill takes less than 2 seconds.  No skin break down or ulcers to back or buttocks  Psychiatric: He has a normal mood and affect. His behavior is normal. Judgment and thought content normal.  Nursing note and vitals reviewed.    ED Treatments / Results  Labs (all labs ordered are listed, but only abnormal results are displayed) Labs Reviewed  BASIC METABOLIC PANEL - Abnormal; Notable for the following components:  Result Value   Sodium 133 (*)    Glucose, Bld 160 (*)    Creatinine, Ser 1.38 (*)    Calcium 7.9 (*)    GFR calc non Af Amer 48 (*)    GFR calc Af Amer 55 (*)    All other components within normal limits  CBC - Abnormal; Notable for the following components:   RBC 3.37 (*)    Hemoglobin 8.7 (*)    HCT 26.9 (*)    MCH 25.8 (*)    All other components within normal limits  I-STAT TROPONIN, ED  POC OCCULT BLOOD, ED    EKG  EKG Interpretation  Date/Time:  Sunday August 30 2017 14:11:14 EST Ventricular Rate:  74 PR Interval:    QRS Duration: 189 QT Interval:  504 QTC Calculation: 667 R Axis:   -63 Text Interpretation:  Atrial-sensed ventricular-paced complexes No further analysis attempted due to paced rhythm No significant change since last tracing Confirmed by Blanchie Dessert [phone]) on 08/30/2017 2:23:45 PM       Radiology Dg Chest 2 View  Result Date: 08/30/2017 CLINICAL DATA:  Left-sided chest pain beginning last night. EXAM: CHEST  2 VIEW COMPARISON:  08/20/2017 FINDINGS: Sternotomy wires and left-sided pacemaker unchanged. Lungs are hypoinflated with minimal posterior left basilar opacification  without significant change and be due to small amount of pleural fluid with atelectasis although infection is less likely. Minimal prominence of the perihilar markings unchanged. Mild stable cardiomegaly. Calcified plaque is present over the thoracic aorta. Remainder of the exam is unchanged. IMPRESSION: Stable mild opacification of the posterior left base likely small effusion with atelectasis, although infection is less likely. Mild stable cardiomegaly with suggestion of minimal vascular congestion. Electronically Signed   By: Marin Olp M.D.   On: 08/30/2017 15:59    Procedures Procedures (including critical care time)  Medications Ordered in ED Medications  carvedilol (COREG) tablet 12.5 mg (12.5 mg Oral Given 08/30/17 1504)  acetaminophen (TYLENOL) tablet 500 mg (500 mg Oral Refused 08/30/17 1459)  amLODipine (NORVASC) tablet 5 mg (5 mg Oral Given 08/30/17 1458)  losartan (COZAAR) tablet 50 mg (50 mg Oral Given 08/30/17 1520)  ipratropium-albuterol (DUONEB) 0.5-2.5 (3) MG/3ML nebulizer solution 3 mL (3 mLs Nebulization Given 08/30/17 1458)     Initial Impression / Assessment and Plan / ED Course  I have reviewed the triage vital signs and the nursing notes.  Pertinent labs & imaging results that were available during my care of the patient were reviewed by me and considered in my medical decision making (see chart for details).  Clinical Course as of Aug 30 1632  Sun Aug 30, 2017  1548 Hemoglobin: (!) 8.7 [CG]    Clinical Course User Index [CG] Kinnie Feil, PA-C   Pt presents with chest pain. This appears to be chronic, multiple ED visits for this. Last admitted and observed overnight, CP resolved and troponins remained minimally elevated and flat. He does have risk factors for CAD.  Clinical picture complicated with non compliance. ACS was ruled out at last admission. Most recent echo with normal LV function and EF 50%. He is hypertensive today however he has refused HTN  meds at nursing facility. Previous presentations to ED for CP have always been accompanied with HTN due to med non compliance. He has equal distal pulses bilaterally, CP constant and non radiating to back or abdomen. Doubt dissection. CXR w/o widening of mediastinum, infiltrate or fluid. He does not look fluid overloaded today. Doubt CHF  exacerbation.  Final Clinical Impressions(s) / ED Diagnoses   Lab work remarkable for drop in hgb from previous, 10 > 8 in 10 days. He has h/o of GIB (hematochezia) which is how he was diagnosed with rectal cancer. He is not candidate for surgery or aggressive treatment. Work up has been at [facility].   Pending hemoccult and delta trop at shift change. Given chronicity of symptoms and mostly unchanged work up today, I anticipate d/c. Consider admission for H/H trend if +hemoccult. Patient, ED treatment and discharge plan was discussed with supervising physician who also evaluated the patient and is agreeable with plan.   Final diagnoses:  None    ED Discharge Orders    None       Arlean Hopping 08/30/17 1633    Blanchie Dessert, MD 09/04/17 [phone]

## 2017-08-30 NOTE — ED Notes (Addendum)
Pt given this RN phone to try and call his wife. Pt reports he has not been compliant with his BP medications. When asked if there is a reason he does not take his meds, pt stated "I just don't. I don't want to."

## 2017-08-30 NOTE — ED Notes (Signed)
ED Provider at bedside. 

## 2017-08-31 ENCOUNTER — Encounter (HOSPITAL_COMMUNITY): Payer: Self-pay

## 2017-08-31 DIAGNOSIS — R079 Chest pain, unspecified: Secondary | ICD-10-CM

## 2017-08-31 DIAGNOSIS — E1122 Type 2 diabetes mellitus with diabetic chronic kidney disease: Secondary | ICD-10-CM | POA: Diagnosis not present

## 2017-08-31 DIAGNOSIS — I159 Secondary hypertension, unspecified: Secondary | ICD-10-CM | POA: Diagnosis not present

## 2017-08-31 DIAGNOSIS — E1159 Type 2 diabetes mellitus with other circulatory complications: Secondary | ICD-10-CM | POA: Diagnosis not present

## 2017-08-31 DIAGNOSIS — Z794 Long term (current) use of insulin: Secondary | ICD-10-CM

## 2017-08-31 DIAGNOSIS — D649 Anemia, unspecified: Secondary | ICD-10-CM | POA: Diagnosis not present

## 2017-08-31 DIAGNOSIS — N183 Chronic kidney disease, stage 3 (moderate): Secondary | ICD-10-CM

## 2017-08-31 LAB — BASIC METABOLIC PANEL
Anion gap: 7 (ref 5–15)
BUN: 17 mg/dL (ref 6–20)
CO2: 23 mmol/L (ref 22–32)
CREATININE: 1.5 mg/dL — AB (ref 0.61–1.24)
Calcium: 7.7 mg/dL — ABNORMAL LOW (ref 8.9–10.3)
Chloride: 105 mmol/L (ref 101–111)
GFR calc Af Amer: 50 mL/min — ABNORMAL LOW (ref >60)
GFR calc non Af Amer: 43 mL/min — ABNORMAL LOW (ref >60)
Glucose, Bld: 219 mg/dL — ABNORMAL HIGH (ref 65–99)
POTASSIUM: 3.4 mmol/L — AB (ref 3.5–5.1)
SODIUM: 135 mmol/L (ref 135–145)

## 2017-08-31 LAB — HEMOGLOBIN AND HEMATOCRIT, BLOOD
HCT: 26.8 % — ABNORMAL LOW (ref 39.0–52.0)
HEMOGLOBIN: 8.8 g/dL — AB (ref 13.0–17.0)

## 2017-08-31 LAB — GLUCOSE, CAPILLARY
GLUCOSE-CAPILLARY: 217 mg/dL — AB (ref 65–99)
GLUCOSE-CAPILLARY: 177 mg/dL — AB (ref 65–99)
GLUCOSE-CAPILLARY: 164 mg/dL — AB (ref 65–99)
Glucose-Capillary: 164 mg/dL — ABNORMAL HIGH (ref 65–99)

## 2017-08-31 LAB — CBC
HCT: 26.1 % — ABNORMAL LOW (ref 39.0–52.0)
Hemoglobin: 8.4 g/dL — ABNORMAL LOW (ref 13.0–17.0)
MCH: 26 pg (ref 26.0–34.0)
MCHC: 32.2 g/dL (ref 30.0–36.0)
MCV: 80.8 fL (ref 78.0–100.0)
PLATELETS: 347 10*3/uL (ref 150–400)
RBC: 3.23 MIL/uL — AB (ref 4.22–5.81)
RDW: 14.9 % (ref 11.5–15.5)
WBC: 7.5 10*3/uL (ref 4.0–10.5)

## 2017-08-31 LAB — POCT I-STAT TROPONIN I: Troponin i, poc: 0.1 ng/mL (ref 0.00–0.08)

## 2017-08-31 LAB — TROPONIN I: Troponin I: 0.45 ng/mL (ref <0.03)

## 2017-08-31 MED ORDER — GLUCERNA SHAKE PO LIQD
237.0000 mL | Freq: Two times a day (BID) | ORAL | Status: DC
  Administered 2017-09-01: 237 mL via ORAL

## 2017-08-31 MED ORDER — POTASSIUM CHLORIDE CRYS ER 20 MEQ PO TBCR
40.0000 meq | EXTENDED_RELEASE_TABLET | Freq: Two times a day (BID) | ORAL | Status: DC
  Administered 2017-09-01: 40 meq via ORAL
  Filled 2017-08-31 (×2): qty 2

## 2017-08-31 MED ORDER — HYDRALAZINE HCL 10 MG PO TABS: 10.0000 mg | ORAL_TABLET | ORAL | Status: DC | PRN

## 2017-08-31 NOTE — Progress Notes (Signed)
Patient tolerating clear liquid diet well.  Asking for more to eat.  Paged MD regarding advancing PO diet.  Order for Soft Diet received.

## 2017-08-31 NOTE — Progress Notes (Signed)
PROGRESS NOTE    Shawn Pearson  SWN:[id number] DOB: Aug 13, 1941 DOA: 08/30/2017 PCP: Mabeline Caras, NP   Brief Narrative:  Shawn Pearson is a 76 y.o. male with medical history significant chronic kidney disease, hypertension, diabetes, dementia, chronic chest pain CAD, ICD placement, stage III rectal since emergency department from her facility with the chief complaint of chest pain cancer. Initial evaluation reveals worsening anemia. [facility] are asked to admit  Information was obtained from the chart and the patient noting that information from patient may be unreliable due to dementia. Chart indicates he is noncompliant with medications at facility. Blood pressure becomes elevated and he develops chest pain and comes to the emergency department. He has had multiple presentations and a few admissions rule out chest pain. He has stress test in February negative. He was discharged 9 days ago for chest pain rule out. He denies headache dizziness syncope or near-syncope. He denies shortness of breath diaphoresis nausea vomiting. He denies diarrhea constipation melena bright red blood per rectum. He denies dysuria hematuria frequency or urgency. Troponin currently cycling. And will repeat Blood work in AM.   Assessment & Plan:   Principal Problem:   Anemia Active Problems:   DM2 (diabetes mellitus, type 2) ([place])   CKD stage 3 secondary to diabetes ([place])   Chest pain   Hypertension   Anemia.  -Hemoglobin 8.7 on Admission. Hb/Hct went to 8.4/26.1 -He has a history of iron deficiency anemia and chronic kidney disease.  -Last hemoglobin in our system was 10.1 about 10 days ago.  -Patient does have a history of a colorectal mass in the process of following up with surgery and [place]. FOBT negative.  -No shortness of breath no tachycardia she is hypertensive -Admit to telemetry for observation -Check an Anemia panel: Iron Level was 18, UIBC 234, TIBC 252, Saturation Ratios  7, Ferritin 191, Folate -Serial CBCs -Repeat CBC in AM   Chest pain improved -Resolved at the point of admission. Denied CP today  -Chest x-ray with stable mild opacification of posterior left base likely small effusion with atelectasis. Mild stable cardiomegaly with suggestion of minimal vascular congestion.  -EKG without acute changes.  -Troponin slightly elevated at 0.10; Continue to Cycle Cardiac Troponins; Repeat 0.45 and Pateint denies CP - Patient with a history of multiple episodes/presentations/admissions a chest pain. -Presumably related to hypertensive emergency and urgency that he tends to develop in the setting of noncompliance with his antihypertensive medications. -Echo in March 2018 showed normal LV function EF of 50%, mild to moderate LVH with stage I diastolic dysfunction.  -Left heart cath in 2017 not completed there was inability to pass the catheter. -Monitor on telemetry -Continue home meds -Improved blood pressure control -follow up with Dr Burt Knack at [facility]  Hypertension. -Uncontrolled in the emergency department secondary to noncompliance.  -Was given a beta blocker in the emergency department starting to improve.  -Home medications include clonidine patch, Norvasc, Coreg, Lasix, hydralazine, losartan. -Resume these medications -Monitor blood pressure  Diabetes. Type II.  -Serum glucose 160 on admission.  -Home medications include long-acting insulin as well as sliding scale -Continue long-acting insulin -Sliding scale for optimal control -Carb modified diet -CBG's ranging from 164-217  Chronic kidney disease stage III.  -Creatinine 1.3 which is close to baseline. -Hold nephrotoxins -Gentle IV fluids -Recheck in the morning  Hypokalemia -Patient's K+ was 3.4 -Replete -Repeat CMP in AM   DVT prophylaxis: None given Anemia Code Status: FULL CODE Family Communication: No family present at bedside  Disposition Plan: SNF.   Consultants:   None   Procedures: None   Antimicrobials:  Anti-infectives (From admission, onward)   None     Subjective: Seen and examined and had no complaints but wanted to eat. No lightheadedness or dizziness.   Objective: Vitals:   08/30/17 2246 08/31/17 0502 08/31/17 0707 08/31/17 1338  BP: (!) 143/88 (!) 149/62 (!) 159/64 135/69  Pulse:  76  65  Resp:  (!) 22  14  Temp:  98.4 F (36.9 C)  98.5 F (36.9 C)  TempSrc:  Axillary  Axillary  SpO2:  100%  100%  Weight:  95.5 kg (210 lb 8.6 oz)    Height:        Intake/Output Summary (Last 24 hours) at 08/31/2017 2133 Last data filed at 08/31/2017 1830 Gross per 24 hour  Intake 1607.5 ml  Output 1365 ml  Net 242.5 ml   Filed Weights   08/30/17 1412 08/30/17 1745 08/31/17 0502  Weight: 97.1 kg (214 lb) 93.3 kg (205 lb 11 oz) 95.5 kg (210 lb 8.6 oz)   Examination: Physical Exam:  Constitutional: WN/WD obese AAM in NAD and appears calm and comfortable Eyes:  Lids and conjunctivae normal, sclerae anicteric  ENMT: External Ears, Nose appear normal. Grossly normal hearing. Mucous membranes are moist.  Neck: Appears normal, supple, no cervical masses, normal ROM, no appreciable thyromegaly, no JVD Respiratory: Diminished to auscultation bilaterally, no wheezing, rales, rhonchi or crackles. Normal respiratory effort and patient is not tachypenic. No accessory muscle use.  Cardiovascular: RRR, no murmurs / rubs / gallops. S1 and S2 auscultated. No extremity edema.  Abdomen: Soft, non-tender, non-distended. No masses palpated. No appreciable hepatosplenomegaly. Bowel sounds positive.  GU: Deferred. Musculoskeletal: Bilateral BKA's Skin: No rashes, lesions, ulcers on a limited skin eval. No induration; Warm and dry.  Neurologic: CN 2-12 grossly intact with no focal deficits.Romberg sign and cerebellar reflexes not assessed.  Psychiatric: Impaired judgment and insight. Alert and oriented x 3. Normal mood and appropriate affect.   Data  Reviewed: I have personally reviewed following labs and imaging studies  CBC: Recent Labs  Lab 08/30/17 1429 08/31/17 0349  WBC 8.2 7.5  HGB 8.7* 8.4*  HCT 26.9* 26.1*  MCV 79.8 80.8  PLT 354 628   Basic Metabolic Panel: Recent Labs  Lab 08/30/17 1429 08/31/17 0349  NA 133* 135  K 3.6 3.4*  CL 106 105  CO2 22 23  GLUCOSE 160* 219*  BUN 13 17  CREATININE 1.38* 1.50*  CALCIUM 7.9* 7.7*   GFR: Estimated Creatinine Clearance: 48 mL/min (A) (by C-G formula based on SCr of 1.5 mg/dL (H)). Liver Function Tests: No results for input(s): AST, ALT, ALKPHOS, BILITOT, PROT, ALBUMIN in the last 168 hours. No results for input(s): LIPASE, AMYLASE in the last 168 hours. No results for input(s): AMMONIA in the last 168 hours. Coagulation Profile: No results for input(s): INR, PROTIME in the last 168 hours. Cardiac Enzymes: Recent Labs  Lab 08/31/17 1923  TROPONINI 0.45*   BNP (last 3 results) No results for input(s): PROBNP in the last 8760 hours. HbA1C: No results for input(s): HGBA1C in the last 72 hours. CBG: Recent Labs  Lab 08/30/17 1947 08/31/17 0716 08/31/17 1120 08/31/17 1707 08/31/17 2129  GLUCAP 272* 164* 217* 177* 164*   Lipid Profile: No results for input(s): CHOL, HDL, LDLCALC, TRIG, CHOLHDL, LDLDIRECT in the last 72 hours. Thyroid Function Tests: No results for input(s): TSH, T4TOTAL, FREET4, T3FREE, THYROIDAB in the last 72 hours.  Anemia Panel: Recent Labs    08/30/17 1818  VITAMINB12 335  FOLATE 9.0  FERRITIN 191  TIBC 252  IRON 18*  RETICCTPCT 0.9   Sepsis Labs: No results for input(s): PROCALCITON, LATICACIDVEN in the last 168 hours.  Recent Results (from the past 240 hour(s))  MRSA PCR Screening     Status: None   Collection Time: 08/30/17  7:56 PM  Result Value Ref Range Status   MRSA by PCR NEGATIVE NEGATIVE Final    Comment:        The GeneXpert MRSA Assay (FDA approved for NASAL specimens only), is one component of  a comprehensive MRSA colonization surveillance program. It is not intended to diagnose MRSA infection nor to guide or monitor treatment for MRSA infections.     Radiology Studies: Dg Chest 2 View  Result Date: 08/30/2017 CLINICAL DATA:  Left-sided chest pain beginning last night. EXAM: CHEST  2 VIEW COMPARISON:  08/20/2017 FINDINGS: Sternotomy wires and left-sided pacemaker unchanged. Lungs are hypoinflated with minimal posterior left basilar opacification without significant change and be due to small amount of pleural fluid with atelectasis although infection is less likely. Minimal prominence of the perihilar markings unchanged. Mild stable cardiomegaly. Calcified plaque is present over the thoracic aorta. Remainder of the exam is unchanged. IMPRESSION: Stable mild opacification of the posterior left base likely small effusion with atelectasis, although infection is less likely. Mild stable cardiomegaly with suggestion of minimal vascular congestion. Electronically Signed   By: Marin Olp M.D.   On: 08/30/2017 15:59   Scheduled Meds: . acetaminophen  500 mg Oral Once  . amLODipine  10 mg Oral Daily  . atorvastatin  40 mg Oral QHS  . busPIRone  5 mg Oral TID  . carvedilol  12.5 mg Oral BID WC  . [START ON 09/01/2017] feeding supplement (GLUCERNA SHAKE)  237 mL Oral BID BM  . finasteride  5 mg Oral Daily  . FLUoxetine  20 mg Oral Daily  . furosemide  60 mg Oral Daily  . insulin aspart  0-5 Units Subcutaneous QHS  . insulin aspart  0-9 Units Subcutaneous TID WC  . insulin glargine  6 Units Subcutaneous QHS  . losartan  50 mg Oral Daily  . pantoprazole  40 mg Oral Daily  . polyethylene glycol  17 g Oral Daily  . tamsulosin  0.4 mg Oral QPC supper  . Valproate Sodium  500 mg Oral BID   Continuous Infusions:   LOS: 0 days   Kerney Elbe, DO [facility] Pager [phone]  If 7PM-7AM, please contact night-coverage [email or web address] Password [facility] 08/31/2017, 9:33  PM

## 2017-08-31 NOTE — Progress Notes (Signed)
Initial Nutrition Assessment  DOCUMENTATION CODES:   Obesity unspecified  INTERVENTION:   -D/c Boost Breeze po TID, each supplement provides 250 kcal and 9 grams of protein -Glucerna Shake po BID, each supplement provides 220 kcal and 10 grams of protein  NUTRITION DIAGNOSIS:   Inadequate oral intake related to altered GI function as evidenced by meal completion < 50%.  GOAL:   Patient will meet greater than or equal to 90% of their needs  MONITOR:   PO intake, Supplement acceptance, Diet advancement, Labs, Weight trends, Skin, I & O's  REASON FOR ASSESSMENT:   Malnutrition Screening Tool    ASSESSMENT:   Shawn Pearson is a 76 y.o. male with medical history significant chronic kidney disease, hypertension, diabetes, dementia, chronic chest pain CAD, ICD placement, stage III rectal since emergency department from her facility with the chief complaint of chest pain cancer. Initial evaluation reveals worsening anemia  Pt admitted with anemia.   Pt sleeping soundly at time of visit; did not arouse for interview or during exam. Lunch tray at bedside was untouched, however, pt consumed 100% of Boost Breeze supplement and pudding.   Reviewed wt hx; noted pt has experienced a 5.8% wt loss over the past 6 months, which is not significant for time frame.   Reviewed records from [facility]; pt was on a carb modified diet PTA. Pertinent medications PTA include 6 units insulin glargine at bedtime and sliding scale insulin at meals  Labs reviewed: K: 3.4, CBGS: 164-272 (inpatient orders for glycemic control are 0-5 units insulin aspart q HS, 0-9 units insulin aspart TID with meals, 6 units insulin glargine at bedtime).   NUTRITION - FOCUSED PHYSICAL EXAM:    Most Recent Value  Orbital Region  No depletion  Upper Arm Region  No depletion  Thoracic and Lumbar Region  No depletion  Buccal Region  No depletion  Temple Region  No depletion  Clavicle Bone Region  No depletion   Clavicle and Acromion Bone Region  No depletion  Scapular Bone Region  No depletion  Dorsal Hand  No depletion  Patellar Region  No depletion  Anterior Thigh Region  No depletion  Posterior Calf Region  Unable to assess  Edema (RD Assessment)  Mild  Hair  Reviewed  Eyes  Reviewed  Mouth  Reviewed  Skin  Reviewed  Nails  Reviewed       Diet Order:  DIET SOFT Room service appropriate? Yes; Fluid consistency: Thin  EDUCATION NEEDS:   No education needs have been identified at this time  Skin:  Skin Assessment: Reviewed RN Assessment  Last BM:  08/27/17  Height:   Ht Readings from Last 1 Encounters:  08/30/17 6\' 2"  (1.88 m)    Weight:   Wt Readings from Last 1 Encounters:  08/31/17 210 lb 8.6 oz (95.5 kg)    Ideal Body Weight:  75.7 kg  BMI:  Body mass index is 27.03 kg/m.  Estimated Nutritional Needs:   Kcal:  1650-1850  Protein:  80-95 grams  Fluid:  1.6-1.8 L    Iley Breeden A. Jimmye Norman, RD, LDN, CDE Pager: [phone] After hours Pager: [phone]

## 2017-08-31 NOTE — Care Management Obs Status (Signed)
[place] NOTIFICATION   Patient Details  Name: Shawn Pearson MRN: [id number] Date of Birth: 1941-06-26   Medicare Observation Status Notification Given:  Yes    Arley Phenix, RN 08/31/2017, 5:15 PM

## 2017-09-01 ENCOUNTER — Observation Stay (HOSPITAL_BASED_OUTPATIENT_CLINIC_OR_DEPARTMENT_OTHER): Payer: Medicare Other

## 2017-09-01 DIAGNOSIS — E1122 Type 2 diabetes mellitus with diabetic chronic kidney disease: Secondary | ICD-10-CM | POA: Diagnosis not present

## 2017-09-01 DIAGNOSIS — R748 Abnormal levels of other serum enzymes: Secondary | ICD-10-CM

## 2017-09-01 DIAGNOSIS — E1159 Type 2 diabetes mellitus with other circulatory complications: Secondary | ICD-10-CM | POA: Diagnosis not present

## 2017-09-01 DIAGNOSIS — Z794 Long term (current) use of insulin: Secondary | ICD-10-CM | POA: Diagnosis not present

## 2017-09-01 DIAGNOSIS — D508 Other iron deficiency anemias: Secondary | ICD-10-CM

## 2017-09-01 DIAGNOSIS — R079 Chest pain, unspecified: Secondary | ICD-10-CM

## 2017-09-01 DIAGNOSIS — D649 Anemia, unspecified: Secondary | ICD-10-CM | POA: Diagnosis not present

## 2017-09-01 DIAGNOSIS — N183 Chronic kidney disease, stage 3 (moderate): Secondary | ICD-10-CM | POA: Diagnosis not present

## 2017-09-01 DIAGNOSIS — I159 Secondary hypertension, unspecified: Secondary | ICD-10-CM | POA: Diagnosis not present

## 2017-09-01 LAB — ECHOCARDIOGRAM COMPLETE
HEIGHTINCHES: 74 in
WEIGHTICAEL: 3407.43 [oz_av]

## 2017-09-01 LAB — GLUCOSE, CAPILLARY: Glucose-Capillary: 126 mg/dL — ABNORMAL HIGH (ref 65–99)

## 2017-09-01 MED ORDER — PERFLUTREN LIPID MICROSPHERE
1.0000 mL | INTRAVENOUS | Status: AC | PRN
  Administered 2017-09-01: 2 mL via INTRAVENOUS
  Filled 2017-09-01: qty 10

## 2017-09-01 MED ORDER — CARVEDILOL 12.5 MG PO TABS: 12.5000 mg | ORAL_TABLET | Freq: Two times a day (BID) | ORAL | Status: DC

## 2017-09-01 MED ORDER — GLUCERNA SHAKE PO LIQD: 237.0000 mL | Freq: Two times a day (BID) | ORAL | 0 refills | Status: AC

## 2017-09-01 NOTE — Progress Notes (Signed)
Patient will discharge to [facility] Anticipated discharge date: 09/01/17 Family notified: called wife, Herny Scurlock, but no answer and no voicemail available Transportation by: [facility]  Nurse to call report to [phone]   CSW signing off.  [name], [place]  Clinical Social Worker

## 2017-09-01 NOTE — Consult Note (Signed)
Cardiology Consult    Patient ID: Shawn Pearson MRN: [id number], DOB/AGE: [birth year]   Admit date: 08/30/2017 Date of Consult: 09/01/2017  Primary Physician: Mabeline Caras, NP Primary Cardiologist: Dr. Burt Knack [facility] Requesting Provider: Dr. Alfredia Ferguson  Reason for Consult: chest pain  Patient Profile   Shawn Pearson is a 76 y.o. male who is being seen today for the evaluation of chest pain at the request of Dr. Alfredia Ferguson. He has a significant h/o ischemic cardiomyopathy s/p ICD, severe PVD s/p bil LE amputations, complete HB s/p pace maker, HTN, T2DM, and anemia 2/2 GI bleeding. Patient was admitted 11/11 with acute on chronic anemia, hypertensive urgency, and chest pain.   Past Medical History   Past Medical History:  Diagnosis Date  . Aortic atherosclerosis ([place])   . Dementia   . Diabetes mellitus without complication ([place])   . History of angina   . Hypertension   . Renal disorder    CKD stage 3    Past Surgical History:  Procedure Laterality Date  . BELOW KNEE LEG AMPUTATION Bilateral      Allergies  No Known Allergies  History of Present Illness    Shawn Pearson is a 76yo male with significant h/o ischemic cardiomyopathy s/p ICD and CABG, severe PVD s/p bil LE amputations, complete HB s/p pace maker, HTN, T2DM, CKD stage 3, and anemia 2/2 GI bleeding and rectal cancer. He presented to the [facility] on 11/11 from SNF where he resides 2/2 likely vascular dementia with chest pain. Per admission note he was not able to provide accurate history for the most part; there is documentation of medication noncompliance at his nursing facility. At time of admission, chest pain had apparently resolved. He was found to have hypertensive urgency and acute on chronic anemia (Fe deficiency).   He follows with [facility] cardiology with Dr. Burt Knack.  Patient evaluated at bedside; he does endorse having chest pain similar to prior episodes of chest pain prior to admission, however states "i don't know"  to any other qualifying factors. Patient declined further questioning or exam. He asks when he will be discharged and that he is waiting for his wife to come by to visit him.  Per care everywhere, patient with extensive cardiac history, former smoker. He has had several ED and hospitalizations in the last few months surrounding chest pain with mainly low level troponemia to 0.04 in setting of refusing BP meds at his facility, as well as admissions for his anemia.   Of note, patient has newer diagnosis of invasive rectal cancer that is inoperable and due to his performance status, GI onc has not deemed him to be a candidate for systemic therapy. He was supposed to have palliative radiation at [facility] - no care everywhere records when queried from [facility] system.  Inpatient Medications    . acetaminophen  500 mg Oral Once  . amLODipine  10 mg Oral Daily  . atorvastatin  40 mg Oral QHS  . busPIRone  5 mg Oral TID  . carvedilol  12.5 mg Oral BID WC  . feeding supplement (GLUCERNA SHAKE)  237 mL Oral BID BM  . finasteride  5 mg Oral Daily  . FLUoxetine  20 mg Oral Daily  . furosemide  60 mg Oral Daily  . insulin aspart  0-5 Units Subcutaneous QHS  . insulin aspart  0-9 Units Subcutaneous TID WC  . insulin glargine  6 Units Subcutaneous QHS  . losartan  50 mg Oral Daily  . pantoprazole  40 mg Oral Daily  . polyethylene glycol  17 g Oral Daily  . potassium chloride  40 mEq Oral BID  . tamsulosin  0.4 mg Oral QPC supper  . Valproate Sodium  500 mg Oral BID    Outpatient Medications    Prior to Admission medications   Medication Sig Start Date End Date Taking? Authorizing Provider  amLODipine (NORVASC) 10 MG tablet Take 10 mg by mouth daily.   Yes [provider]  atorvastatin (LIPITOR) 40 MG tablet Take 40 mg by mouth at bedtime. 03/30/17 03/30/18 Yes [provider]  busPIRone (BUSPAR) 5 MG tablet Take 5 mg by mouth 3 (three) times daily.   Yes [provider]    carvedilol (COREG) 25 MG tablet Take 25 mg 2 (two) times daily with a meal by mouth.  10/07/13  Yes [provider]  cloNIDine (CATAPRES - DOSED IN MG/24 HR) 0.1 mg/24hr patch Place 0.1 mg once a week onto the skin. THURSDAY 09/03/17  Yes [provider]  docusate sodium (COLACE) 100 MG capsule Take 100 mg by mouth daily as needed for mild constipation.   Yes [provider]  finasteride (PROSCAR) 5 MG tablet Take 5 mg by mouth daily.   Yes [provider]  FLUoxetine (PROZAC) 20 MG capsule Take 20 mg by mouth daily. 08/13/17  Yes [provider]  furosemide (LASIX) 40 MG tablet Take 60 mg by mouth daily. 08/13/17  Yes [provider]  gabapentin (NEURONTIN) 300 MG capsule Take 300 mg 2 (two) times daily as needed by mouth (chronic pain).  08/13/17  Yes [provider]  hydrALAZINE (APRESOLINE) 10 MG tablet Take 10 mg as needed by mouth (HTN).  08/13/17  Yes [provider]  Insulin Glargine (BASAGLAR KWIKPEN) 100 UNIT/ML SOPN Inject 6 Units into the skin at bedtime. 03/30/17  Yes [provider]  losartan (COZAAR) 50 MG tablet Take 50 mg by mouth daily.   Yes [provider]  nitroGLYCERIN (NITROSTAT) 0.4 MG SL tablet Place 0.4 mg as needed under the tongue for chest pain.  08/13/17  Yes [provider]  NOVOLOG FLEXPEN 100 UNIT/ML FlexPen 4 (four) times daily. Sliding scale 150-200 2 units 201-250 4 units 251-300 6 units 301-350 8 units 350-400 10 units 08/13/17  Yes [provider]  ondansetron (ZOFRAN) 4 MG tablet Take 4 mg as needed by mouth for nausea or vomiting.    Yes [provider]  pantoprazole (PROTONIX) 40 MG tablet Take 40 mg by mouth daily. 08/13/17  Yes [provider]  polyethylene glycol powder (GLYCOLAX/MIRALAX) powder Take 17 g by mouth daily. 08/13/17  Yes Forde Dandy, MD  RISPERIDONE MICROSPHERES IM Inject 1 mg daily into the muscle. For three  days 08/29/17 08/31/17 Yes [provider]  Valproate Sodium (DEPAKENE) 250 MG/5ML SOLN solution Take 10 mLs by mouth 2 (two) times daily.   Yes [provider]  tamsulosin (FLOMAX) 0.4 MG CAPS capsule Take 0.4 mg daily after supper by mouth.  08/13/17   [provider]   Family History    History reviewed. No pertinent family history.  Social History    Social History   Socioeconomic History  . Marital status: Widowed    Spouse name: Not on file  . Number of children: Not on file  . Years of education: Not on file  . Highest education level: Not on file  Social Needs  . Financial resource strain: Not on file  .  Food insecurity - worry: Not on file  . Food insecurity - inability: Not on file  . Transportation needs - medical: Not on file  . Transportation needs - non-medical: Not on file  Occupational History  . Not on file  Tobacco Use  . Smoking status: Unknown If Ever Smoked  . Smokeless tobacco: Never Used  Substance and Sexual Activity  . Alcohol use: No  . Drug use: No  . Sexual activity: No  Other Topics Concern  . Not on file  Social History Narrative  . Not on file     Review of Systems  Negative other than noted in HPI. Patient declined further questioning  Physical Exam    Blood pressure (!) 166/70, pulse 71, temperature 99.1 F (37.3 C), resp. rate 19, height 6\' 2"  (1.88 m), weight 212 lb 15.4 oz (96.6 kg), SpO2 96 %.  General: sleeping but easily woken up, disgruntled, but appears comfortable; only will face the window Psych: flat affect Neuro: Alert. Moves all extremities spontaneously. No obvious CN abnormality  Neck: No appreciable JVD but full exam was declined by patient. Lungs:  No increased work of breathing. Heart: Patient declined exam; telemetry shows SR Abdomen: Obese.  Extremities: s/p bil BKAs, moves all 4 extremities freely, no wounds appreciated from limited exam Skin: no rashes or wounds appreciated  Labs     Troponin [facility] of Care Test) Recent Labs    08/30/17 1439  TROPIPOC 0.10*   Recent Labs    08/31/17 1923  TROPONINI 0.45*   Lab Results  Component Value Date   WBC 7.5 08/31/2017   HGB 8.8 (L) 08/31/2017   HCT 26.8 (L) 08/31/2017   MCV 80.8 08/31/2017   PLT 347 08/31/2017    Recent Labs  Lab 08/31/17 0349  NA 135  K 3.4*  CL 105  CO2 23  BUN 17  CREATININE 1.50*  CALCIUM 7.7*  GLUCOSE 219*   No results found for: CHOL, HDL, LDLCALC, TRIG No results found for: [facility]   Radiology Studies    Dg Chest 2 View  Result Date: 08/30/2017 CLINICAL DATA:  Left-sided chest pain beginning last night. EXAM: CHEST  2 VIEW COMPARISON:  08/20/2017 FINDINGS: Sternotomy wires and left-sided pacemaker unchanged. Lungs are hypoinflated with minimal posterior left basilar opacification without significant change and be due to small amount of pleural fluid with atelectasis although infection is less likely. Minimal prominence of the perihilar markings unchanged. Mild stable cardiomegaly. Calcified plaque is present over the thoracic aorta. Remainder of the exam is unchanged. IMPRESSION: Stable mild opacification of the posterior left base likely small effusion with atelectasis, although infection is less likely. Mild stable cardiomegaly with suggestion of minimal vascular congestion. Electronically Signed   By: Marin Olp M.D.   On: 08/30/2017 15:59   Dg Chest 2 View  Result Date: 08/20/2017 CLINICAL DATA:  Left-sided chest pain and palpitations. EXAM: CHEST  2 VIEW COMPARISON:  None. FINDINGS: Cardiomegaly with aortic atherosclerosis. No aortic aneurysm. ICD device projects over the left mid lung with leads in the right atrium and right ventricle. Small focus of airspace opacity seen posteriorly at the left lung base. Pneumonia is not excluded in the setting of adjacent atelectasis. No overt pulmonary edema. No acute nor suspicious osseous abnormality. Status post median sternotomy.  IMPRESSION: Cardiomegaly with aortic atherosclerosis. Patchy opacity at the left lung base posteriorly may reflect a a focal pneumonia in the setting of atelectasis. Electronically Signed   By: Meredith Leeds.D.  On: 08/20/2017 21:13    ECG & Cardiac Imaging    LHC 06/17/16: Cancelled due to inability to cannulate RFA  Assessment & Plan    Chest pain: In setting of known CAD, acute on chronic anemia; mildly elevated troponin at 0.45. Will f/u Echo to evaluate for new wall motion abnormalities at rest and re-eval EF; in setting of comorbidities and prior failed cannulation due to extensive artheroslerosis, would favor medical management. We could add on imdur (was previously on it, but was discontinued at some point between frequent re-admissions and ED visits) if patient will take.  HTN: Home meds of norvasc 10mg  daily, coreg 25mg  BID, losartan 50mg  daily, clonidine patch 0.1mg  qweekly. Overall well controlled when patient was restarted on his medicines on admission.  Chronic sCHF, ischemic cardiomyopathy s/p ICD: Home meds of lasix 60mg  daily, carvedilol 25mg  BID, losartan 50mg  daily, atorvastatin 40mg  daily. --f/u Echo   Complete heart block s/p bivent pacer: Telemetry reveals paced rhythm with occasional PVCs.  Overall, would consider goals of care discussion with family (wife is noted as point of contact on his SNF paperwork) in setting of comorbidities and frequent readmissions/ED visits and medication non-adherance.  Signed, Alphonzo Grieve 09/01/2017, 10:07 AM   History and all data above reviewed.  Patient examined.  I agree with the findings as above.   The patient initially was reluctant to answer questions.  However, he eventually reported that he had sharp chest pains lasting for hours.  No associated symptoms.  Says they were "Where my heart was."  He says they are gone now.  However, he did have some mild troponin rise.  He has paced rhythm on his EKG.  Echo results pending.   The patient exam reveals COR:RRR, no rub  ,  Lungs: Clear  ,  Abd: Positive bowel sounds, no rebound no guarding, Ext Status post BKA bilateral  .  All available labs, radiology testing, previous records reviewed. Agree with documented assessment and plan. Elevated troponin:  Non specific.  He has had an extensive medical history and is cared for at [facility].  I don't think that other testing is indicated.  I did review his meds.  His meds at home are fine.  I think the issue apparently is one of compliance.  At this point there is no reason for [place] to change the meds.  Rather he should have continuity and follow up with his primary team including cardiology at [facility].  Please call with further questions.    Shawn Pearson  2:44 PM  09/01/2017

## 2017-09-01 NOTE — Progress Notes (Signed)
PT Cancellation Note  Patient Details Name: Shawn Pearson MRN: [id number] DOB: [birth year]   Cancelled Treatment:    Reason Eval/Treat Not Completed: Patient declined, no reason specified.  I'm not doing it.. I don't want any therapy. 09/01/2017  Donnella Sham, [place] [phone]  (pager)   Tessie Fass [name] 09/01/2017, 3:20 PM

## 2017-09-01 NOTE — Progress Notes (Signed)
  Echocardiogram 2D Echocardiogram has been performed.  Jennette Dubin 09/01/2017, 11:19 AM

## 2017-09-01 NOTE — Progress Notes (Signed)
Tech entered into Pt's room to check blood glucose. Pt stated that he was not going to get blood sugar done and he can eat without doing that. Tech Chiropodist.

## 2017-09-01 NOTE — Progress Notes (Signed)
Patient being uncooperative and aggressive, refusing to answer questions regarding assistance, or in taking medications, and then when I was attempting to hand the medications to him, patient responded by saying I am not going to take that. After several attempts with communication, and patient refusing to answer, I said Shawn Pearson, if you do not answer I am unable to help you, because I do not know what you want. Patient refused to answer, so I said Shawn Pearson, if you do not answer I will take that as a NO, or refusal. Patient's blood sugar was elevated and he required insulin with his breakfast, I approached him with the insulin syringe and said I have your insulin for you, patient swung at me and said I don't take that until I have eaten. I replied your meal is in front of you, do you need help, patient said I need to be adjusted. I asked him how he needed to be adjusted, patient refused to answer, I asked him if he needed to be pulled up, patient turned his head away and did not reply, I said, "Ok Shawn Pearson I take that as a NO to both questions, if you need something let me know", and stepped out of the room. I had asked patient if he would take his medications and he refused to answer, I reiterated that a refusal to answer as being a NO, Therefore I have taken his refusal to answer as a NO, Patient has refused all his medications

## 2017-09-01 NOTE — Progress Notes (Signed)
Harvard Capri to be D/C'd Althea Charon per MD order.  Discussed with the patient and all questions fully answered.  VSS, Skin clean, dry and intact without evidence of skin break down, no evidence of skin tears noted. IV catheter discontinued intact. Site without signs and symptoms of complications. Dressing and pressure applied.  An After Visit Summary was printed and given to the [place]. Patient received prescription.  D/c education completed with [employer] including follow up instructions, medication list & all the medications which patient refused to take today, d/c activities limitations if indicated, with other d/c instructions as indicated by MD - patient able to verbalize understanding, all questions fully answered.   Patient instructed to return to ED, call 911, or call MD for any changes in condition.   Patient escorted via [place], and D/C home via private auto.  Milas Hock 09/01/2017 5:59 PM

## 2017-09-01 NOTE — Clinical Social Work Placement (Signed)
   CLINICAL SOCIAL WORK PLACEMENT  NOTE  Date:  09/01/2017  Patient Details  Name: Shawn Pearson MRN: [id number] Date of Birth: 1941/07/13  Clinical Social Work is seeking post-discharge placement for this patient at the [place] level of care (*CSW will initial, date and re-position this form in  chart as items are completed):  Yes   Patient/family provided with [place] Work Department's list of facilities offering this level of care within the geographic area requested by the patient (or if unable, by the patient's family).  Yes   Patient/family informed of their freedom to choose among providers that offer the needed level of care, that participate in Medicare, Medicaid or managed care program needed by the patient, have an available bed and are willing to accept the patient.  Yes   Patient/family informed of [place]'s ownership interest in [facility] and [facility], as well as of the fact that they are under no obligation to receive care at these facilities.  PASRR submitted to EDS on       PASRR number received on       Existing PASRR number confirmed on 09/01/17     FL2 transmitted to all facilities in geographic area requested by pt/family on 09/01/17     FL2 transmitted to all facilities within larger geographic area on       Patient informed that his/her managed care company has contracts with or will negotiate with certain facilities, including the following:  [facility])     Yes   Patient/family informed of bed offers received.  Patient chooses bed at [facility])     Physician recommends and patient chooses bed at      Patient to be transferred to [facility] [place]) on 09/01/17.  Patient to be transferred to facility by [facility]AR     Patient family notified on 09/01/17 of transfer.  Name of family member notified:  could not  reach wife, no voicemail available     PHYSICIAN Please prepare priority discharge summary, including medications, Please prepare prescriptions, Please sign FL2     Additional Comment:    _______________________________________________ [name], LCSW 09/01/2017, 4:19 PM

## 2017-09-01 NOTE — Progress Notes (Signed)
Pt refusing 0200 blood draw for troponin.

## 2017-09-01 NOTE — Evaluation (Signed)
Occupational Therapy Evaluation Patient Details Name: Shawn Pearson MRN: [id number] DOB: 1941/09/22 Today's Date: 09/01/2017    History of Present Illness 76 year old gentleman with history of dementia, type 2 diabetes, Bil BKA, hypertension, dyslipidemia, coronary artery disease, ICD placement, stage III rectal cancer which was not felt to be candidate for surgery or aggressive treatment presented with chest pain and anemia.      Clinical Impression   Pt angry, with poor eye contact and offering minimal information. Demonstrated ability to self feed and groom in long sitting in bed. Refused sitting EOB and transferring to chair. Pt reports he is assisted for bathing and dressing and uses a w/c for mobility. Pt to return to SNF upon discharge, will defer further OT to SNF.     Follow Up Recommendations  SNF    Equipment Recommendations       Recommendations for Other Services       Precautions / Restrictions Precautions Precautions: Fall Restrictions Weight Bearing Restrictions: No      Mobility Bed Mobility Overal bed mobility: Needs Assistance Bed Mobility: Supine to Sit;Sit to Supine     Supine to sit: Mod assist;HOB elevated Sit to supine: Supervision   General bed mobility comments: Pt long sat in bed from [facility] at high level, pulled up on therapist's hands. Refused sitting EOB and transfer to chair.  Transfers                 General transfer comment: pt declining, stating he just wanted to call his wife, assisted with placing call    Balance Overall balance assessment: Needs assistance Sitting-balance support: Bilateral upper extremity supported;Feet supported Sitting balance-Leahy Scale: Poor Sitting balance - Comments: long sat in bed with min assist while grooming, eating peaches Postural control: Posterior lean                                 ADL either performed or assessed with clinical judgement   ADL Overall ADL's : At  baseline Eating/Feeding: Set up;Sitting   Grooming: Wash/dry hands;Wash/dry face;Sitting;Set up                                 General ADL Comments: Pt with baseline dependence. Not receptive to education.     Vision   Additional Comments: vision appears [facility]     Perception     Praxis      Pertinent Vitals/Pain Pain Assessment: Faces Faces Pain Scale: No hurt     Hand Dominance Right   Extremity/Trunk Assessment Upper Extremity Assessment Upper Extremity Assessment: Overall WFL for tasks assessed(mild edema B hands, pt not cooperative with MMT)   Lower Extremity Assessment Lower Extremity Assessment: Defer to PT evaluation(B BKAs)   Cervical / Trunk Assessment Cervical / Trunk Assessment: Normal   Communication Communication Communication: No difficulties(minimal verbalizations)   Cognition Arousal/Alertness: Awake/alert Behavior During Therapy: Flat affect Overall Cognitive Status: No family/caregiver present to determine baseline cognitive functioning                                 General Comments: Pt follows commands. States he wants to speak to his wife, placed call for him, but only got voicemail.   General Comments       Exercises     Shoulder Instructions  Home Living Family/patient expects to be discharged to:: Skilled nursing facility                                 Additional Comments: Pt admitted from SNF.      Prior Functioning/Environment Level of Independence: Needs assistance  Gait / Transfers Assistance Needed: Needs assist with Anterior/posterior transfers, uses w/c for mobility ADL's / Homemaking Assistance Needed: Needed assist with bathing and dressing at bed level. Self feeds and grooms with set up.            OT Problem List: Decreased range of motion;Decreased activity tolerance;Decreased strength;Impaired balance (sitting and/or standing);Decreased knowledge of use of DME or  AE      OT Treatment/Interventions:      OT Goals(Current goals can be found in the care plan section) Acute Rehab OT Goals Patient Stated Goal: to talk to his wife  OT Frequency:     Barriers to D/C:            Co-evaluation              AM-PAC PT "6 Clicks" Daily Activity     Outcome Measure Help from another person eating meals?: None Help from another person taking care of personal grooming?: A Little Help from another person toileting, which includes using toliet, bedpan, or urinal?: A Lot Help from another person bathing (including washing, rinsing, drying)?: A Lot Help from another person to put on and taking off regular upper body clothing?: A Lot Help from another person to put on and taking off regular lower body clothing?: A Lot 6 Click Score: 15   End of Session Nurse Communication: Other (comment)(ok to have chocolate glucerna)  Activity Tolerance: Patient tolerated treatment well(self limiting) Patient left: in bed;with call bell/phone within reach;with bed alarm set  OT Visit Diagnosis: Muscle weakness (generalized) (M62.81)                Time: 9485-4627 OT Time Calculation (min): 19 min Charges:  OT General Charges $OT Visit: 1 Visit OT Evaluation $OT Eval Moderate Complexity: 1 Mod G-Codes:     2017-09-11 Nestor Lewandowsky, OTR/L Pager: [phone] Werner Lean Haze [place]den 09/11/17, 11:41 AM

## 2017-09-01 NOTE — Plan of Care (Signed)
Pt verbally abusive and combative with staff. Pt using racial slurs and profane language on multiple occasions. Pt refusing meds, telemetry, and blood draws. After multiple talks from staff, charge nurse finally able to administer meds, place pt back on telemetry and talk pt into having blood drawn.

## 2017-09-01 NOTE — Progress Notes (Addendum)
Patient has refused all his oral medications this morning including his B/P medication, His B/P now is 181/126, I told the patient his B/P was 181/126  And asked the patient if he would take his B/P medicine. Patient refused to take medicine. MD notified of situation

## 2017-09-01 NOTE — Progress Notes (Signed)
Patient recently admitted and full assessment was completed at that admission.  CSW discussed discharge with admissions liaison at [facility], who indicated patient can come back under Medicaid benefit after this discharge.  CSW met with patient at bedside, patient alert and oriented. Patient insisted CSW call wife. CSW reflected that at patient's last admission, CSW was unable to get in touch with patient's wife after multiple calls and voicemails. CSW called patient's wife while at bedside. Wife's voicemail box was full and CSW unable to leave a message. Patient prefers to go home to [facility], but at this admission and last, CSW unable to reach wife and patient does not give contact info for other family members.   CSW reflected that safe discharge plan is to return to SNF, as no family members are available to pick patient up. CSW to follow and support with discharge.  [name], [employer]

## 2017-09-01 NOTE — Discharge Summary (Signed)
Physician Discharge Summary  Shawn Pearson XTK:[id number] DOB: 05-04-1941 DOA: 08/30/2017  PCP: Mabeline Caras, NP  Admit date: 08/30/2017 Discharge date: 09/01/2017  Admitted From: SNF Disposition:  SNF  Recommendations for Outpatient Follow-up:  1. Follow up with PCP in 1-2 weeks 2. Follow up with Cardiology at [facility] as an outpatient 3. Ensure to adhere to Medication regimen 4. Please obtain CMP/CBC, Mag, Phos in one week 5. Please follow up on the following pending results:  Home Health: No Equipment/Devices: None  Discharge Condition: Guarded CODE STATUS: FULL CODE Diet recommendation: Heart Healthy Diet  Brief/Interim Summary: Shawn Ginyardis a 76 y.o.malewith medical history significantchronic kidney disease, hypertension, diabetes, dementia, chronic chest pain CAD, ICD placement, stage III rectal since emergency department from her facility with the chief complaint of chest pain cancer.Initial evaluation reveals worsening anemia. [facility] are asked to admit  Patient has been noncompliant with medications at his facility and Blood pressure becomes elevated and he develops chest pain and comes to the emergency department. He has had multiple presentations and a few admissions rule out chest pain. He has stress test in Februarynegative.He was discharged 9 days ago for chest pain rule out. He denies headache dizziness syncope or near-syncope. He was found to have elevated troponin and Cardiology consulted who recommended no additional changes and recommending medical management by continuing current medications and following up with Primary Cardiology at [facility]. Patient has been combative and noncompliant during hospitalization and refusing care. He will be D/C'd back to SNF as he has refused care after strong encouragement of compliance. He will need to see his Outpatient Cardiologist for additional evaluation and medication adjustments.   Discharge  Diagnoses:  Principal Problem:   Anemia Active Problems:   DM2 (diabetes mellitus, type 2) ([place])   CKD stage 3 secondary to diabetes (HCC)   Chest pain   Hypertension  Anemia.  -Hemoglobin 8.7 on Admission. Hb/Hct went to 8.4/26.1 and now 8.8/26.8 -He has a history of iron deficiency anemia and chronic kidney disease.  -Last hemoglobin in our system was 10.1 about 10 days ago. -Patient does have a history of a colorectal mass in the process of following up with surgery and [place]. FOBT negative. -No shortness of breath no tachycardia she is hypertensive -Admit to telemetry for observation -Check an Anemia panel: Iron Level was 18, UIBC 234, TIBC 252, Saturation Ratios 7, Ferritin 191, Folate -Patient refusing Care and will need to follow up with PCP and will defer to him to start Iron Supplementation as patient has been refusing medications  Chest pain, improved -Resolved at the point of admission. Denied CP today  -Chest x-ray with stable mild opacification of posterior left base likely small effusion with atelectasis. Mild stable cardiomegaly with suggestion of minimal vascular congestion. -EKG without acute changes.  -Troponin slightly elevated at 0.10; Continue to Cycle Cardiac Troponins however pateint refused blood draws; Repeat 0.45 and Pateint denies CP -Patient with a history of multiple episodes/presentations/admissions a chest pain.  -Presumably related to hypertensive emergency and urgency that he tends to develop in the setting of noncompliance with his antihypertensive medications.  -Echo in March 2018 showed normal LV function EF of 50%, mild to moderate LVH with stage I diastolic dysfunction.  -Repeat ECHO showed The estimated ejection fraction was in the  range of 40% to 45%. Hypokineis in the inferoseptal and inferior myocardium. Features are consistent with a pseudonormal left ventricular filling pattern, with concomitant abnormal relaxation and increased  filling pressure (grade 2 diastolic  dysfunction). -Left heart cath in 2017 not completed there was inability to pass the catheter due to extensive atherosclerosis. -Monitored on telemetry -Continue home meds and strongly encouraged to remain compliant  -Improved blood pressure control however BP increased as patient is refusing Care and BP medications.  -Inpatient Cardiology Dr. Warren Lacy consulted and felt that no there testing is recommending and recommending continuing home medications and favored medical management and  recommending following up with Dr. Burt Knack -Follow up with Dr Burt Knack at [facility]  Hypertension. -Uncontrolled in the emergency department secondary to noncompliance.  -Was given a beta blocker in the emergency department starting to improve.  -Home medications include clonidine patch, Norvasc, Coreg, Lasix, hydralazine, losartan. -Resume these medications and Cardiology not adjusting; Recommending Outpatient follow up with Cardiology at [facility] -BP elevated prior to D/C but patient refusing care and refusing medications. Understands risks and just wants to leave.   Diabetes. Type II.  -Serum glucose 160 on admission.  -Home medications include long-acting insulin as well as sliding scale -Continue long-acting insulin -Sliding scale for optimal control -Carb modified diet -CBG's ranging from 126-164  Chronic kidney disease stage III.  -Creatinine 1.3 which is close to baseline. -Hold nephrotoxins if possible -Patient refused blood draws and is refusing care  Hypokalemia -Patient's K+ was 3.4 -Replete -Repeat CMP but patient refused blood Draws and is refusing care  Recently Diagnosed Rectal Cancer -Follow up as an outpatient.   Discharge Instructions  Discharge Instructions    Call MD for:  difficulty breathing, headache or visual disturbances   Complete by:  As directed    Call MD for:  extreme fatigue   Complete by:  As directed    Call MD for:  hives    Complete by:  As directed    Call MD for:  persistant dizziness or light-headedness   Complete by:  As directed    Call MD for:  persistant nausea and vomiting   Complete by:  As directed    Call MD for:  redness, tenderness, or signs of infection (pain, swelling, redness, odor or green/yellow discharge around incision site)   Complete by:  As directed    Call MD for:  severe uncontrolled pain   Complete by:  As directed    Call MD for:  temperature >100.4   Complete by:  As directed    Diet - low sodium heart healthy   Complete by:  As directed    Discharge instructions   Complete by:  As directed    Follow up with PCP and [facility]iology at D/C. Take all medications as prescribed and ensure compliance. If symptoms change or worsen please return to the ED for evaluation.   Increase activity slowly   Complete by:  As directed      Allergies as of 09/01/2017   No Known Allergies     Medication List    STOP taking these medications   RISPERIDONE MICROSPHERES IM     TAKE these medications   amLODipine 10 MG tablet Commonly known as:  NORVASC Take 10 mg by mouth daily.   atorvastatin 40 MG tablet Commonly known as:  LIPITOR Take 40 mg by mouth at bedtime.   BASAGLAR KWIKPEN 100 UNIT/ML Sopn Inject 6 Units into the skin at bedtime.   busPIRone 5 MG tablet Commonly known as:  BUSPAR Take 5 mg by mouth 3 (three) times daily.   carvedilol 12.5 MG tablet Commonly known as:  COREG Take 1 tablet (12.5 mg  total) 2 (two) times daily with a meal by mouth. What changed:    medication strength  how much to take   cloNIDine 0.1 mg/24hr patch Commonly known as:  CATAPRES - Dosed in mg/24 hr Place 0.1 mg once a week onto the skin. THURSDAY Start taking on:  09/03/2017   docusate sodium 100 MG capsule Commonly known as:  COLACE Take 100 mg by mouth daily as needed for mild constipation.   feeding supplement (GLUCERNA SHAKE) Liqd Take 237 mLs 2 (two) times daily between  meals by mouth. Start taking on:  09/02/2017   finasteride 5 MG tablet Commonly known as:  PROSCAR Take 5 mg by mouth daily.   FLUoxetine 20 MG capsule Commonly known as:  PROZAC Take 20 mg by mouth daily.   furosemide 40 MG tablet Commonly known as:  LASIX Take 60 mg by mouth daily.   gabapentin 300 MG capsule Commonly known as:  NEURONTIN Take 300 mg 2 (two) times daily as needed by mouth (chronic pain).   hydrALAZINE 10 MG tablet Commonly known as:  APRESOLINE Take 10 mg as needed by mouth (HTN).   losartan 50 MG tablet Commonly known as:  COZAAR Take 50 mg by mouth daily.   nitroGLYCERIN 0.4 MG SL tablet Commonly known as:  NITROSTAT Place 0.4 mg as needed under the tongue for chest pain.   NOVOLOG FLEXPEN 100 UNIT/ML FlexPen Generic drug:  insulin aspart 4 (four) times daily. Sliding scale 150-200 2 units 201-250 4 units 251-300 6 units 301-350 8 units 350-400 10 units   ondansetron 4 MG tablet Commonly known as:  ZOFRAN Take 4 mg as needed by mouth for nausea or vomiting.   pantoprazole 40 MG tablet Commonly known as:  PROTONIX Take 40 mg by mouth daily.   polyethylene glycol powder powder Commonly known as:  GLYCOLAX/MIRALAX Take 17 g by mouth daily.   tamsulosin 0.4 MG Caps capsule Commonly known as:  FLOMAX Take 0.4 mg daily after supper by mouth.   Valproate Sodium 250 MG/5ML Soln solution Commonly known as:  DEPAKENE Take 10 mLs by mouth 2 (two) times daily.      Contact information for after-discharge care    Destination    [facility] .   Service:  Skilled Nursing Contact information: [address] [place] [phone]             No Known Allergies  Consultations:  Cardiology Dr. Percival Spanish  Procedures/Studies: Dg Chest 2 View  Result Date: 08/30/2017 CLINICAL DATA:  Left-sided chest pain beginning last night. EXAM: CHEST  2 VIEW COMPARISON:  08/20/2017  FINDINGS: Sternotomy wires and left-sided pacemaker unchanged. Lungs are hypoinflated with minimal posterior left basilar opacification without significant change and be due to small amount of pleural fluid with atelectasis although infection is less likely. Minimal prominence of the perihilar markings unchanged. Mild stable cardiomegaly. Calcified plaque is present over the thoracic aorta. Remainder of the exam is unchanged. IMPRESSION: Stable mild opacification of the posterior left base likely small effusion with atelectasis, although infection is less likely. Mild stable cardiomegaly with suggestion of minimal vascular congestion. Electronically Signed   By: Marin Olp M.D.   On: 08/30/2017 15:59   Dg Chest 2 View  Result Date: 08/20/2017 CLINICAL DATA:  Left-sided chest pain and palpitations. EXAM: CHEST  2 VIEW COMPARISON:  None. FINDINGS: Cardiomegaly with aortic atherosclerosis. No aortic aneurysm. ICD device projects over the left mid lung with leads  in the right atrium and right ventricle. Small focus of airspace opacity seen posteriorly at the left lung base. Pneumonia is not excluded in the setting of adjacent atelectasis. No overt pulmonary edema. No acute nor suspicious osseous abnormality. Status post median sternotomy. IMPRESSION: Cardiomegaly with aortic atherosclerosis. Patchy opacity at the left lung base posteriorly may reflect a a focal pneumonia in the setting of atelectasis. Electronically Signed   By: Ashley Royalty M.D.   On: 08/20/2017 21:13   ECHOCARDIOGRAM ------------------------------------------------------------------- Study Conclusions  - Left ventricle: The cavity size was normal. There was moderate   concentric hypertrophy. Systolic function was mildly to   moderately reduced. The estimated ejection fraction was in the   range of 40% to 45%. Hypokineis in the inferoseptal and inferior   myocardium. Features are consistent with a pseudonormal left   ventricular  filling pattern, with concomitant abnormal relaxation   and increased filling pressure (grade 2 diastolic dysfunction).   Doppler parameters are consistent with high ventricular filling   pressure. - Aortic valve: Transvalvular velocity was within the normal range.   There was no stenosis. There was no regurgitation. - Mitral valve: Transvalvular velocity was within the normal range.   There was no evidence for stenosis. There was mild regurgitation. - Left atrium: The atrium was moderately dilated. - Right ventricle: The cavity size was normal. Wall thickness was   normal. Systolic function was normal. - Right atrium: The atrium was moderately dilated. - Atrial septum: No defect or patent foramen ovale was identified   by color flow Doppler. - Tricuspid valve: There was mild regurgitation. - Pulmonary arteries: Systolic pressure was within the normal   range. PA peak pressure: 29 mm Hg (S).  Subjective: Seen and examined and has been difficult with nursing staff and is refusing care and all medications. States he does not want to be here and wants to leave. No CP at this time.  Discharge Exam: Vitals:   09/01/17 0305 09/01/17 1437  BP: (!) 166/70 (!) 181/126  Pulse:  82  Resp: 19 (!) 21  Temp: 99.1 F (37.3 C)   SpO2: 96% 95%   Vitals:   09/01/17 0305 09/01/17 0500 09/01/17 1215 09/01/17 1437  BP: (!) 166/70   (!) 181/126  Pulse:    82  Resp: 19   (!) 21  Temp: 99.1 F (37.3 C)     TempSrc:   Other (Comment)   SpO2: 96%   95%  Weight:  96.6 kg (212 lb 15.4 oz)    Height:       General: Pt is alert, awake, not in acute distress Cardiovascular: RRR, S1/S2 +, no rubs, no gallops Respiratory: Diminished bilaterally, no wheezing, no rhonchi Abdominal: Soft, NT, ND, bowel sounds + Extremities: Bilateral BKA  The results of significant diagnostics from this hospitalization (including imaging, microbiology, ancillary and laboratory) are listed below for reference.     Microbiology: Recent Results (from the past 240 hour(s))  MRSA PCR Screening     Status: None   Collection Time: 08/30/17  7:56 PM  Result Value Ref Range Status   MRSA by PCR NEGATIVE NEGATIVE Final    Comment:        The GeneXpert MRSA Assay (FDA approved for NASAL specimens only), is one component of a comprehensive MRSA colonization surveillance program. It is not intended to diagnose MRSA infection nor to guide or monitor treatment for MRSA infections.     Labs: BNP (last 3 results) Recent Labs  08/20/17 2238  BNP 272.5*   Basic Metabolic Panel: Recent Labs  Lab 08/30/17 1429 08/31/17 0349  NA 133* 135  K 3.6 3.4*  CL 106 105  CO2 22 23  GLUCOSE 160* 219*  BUN 13 17  CREATININE 1.38* 1.50*  CALCIUM 7.9* 7.7*   Liver Function Tests: No results for input(s): AST, ALT, ALKPHOS, BILITOT, PROT, ALBUMIN in the last 168 hours. No results for input(s): LIPASE, AMYLASE in the last 168 hours. No results for input(s): AMMONIA in the last 168 hours. CBC: Recent Labs  Lab 08/30/17 1429 08/31/17 0349 08/31/17 2309  WBC 8.2 7.5  --   HGB 8.7* 8.4* 8.8*  HCT 26.9* 26.1* 26.8*  MCV 79.8 80.8  --   PLT 354 347  --    Cardiac Enzymes: Recent Labs  Lab 08/31/17 1923  TROPONINI 0.45*   BNP: Invalid input(s): POCBNP CBG: Recent Labs  Lab 08/31/17 0716 08/31/17 1120 08/31/17 1707 08/31/17 2129 09/01/17 0819  GLUCAP 164* 217* 177* 164* 126*   D-Dimer No results for input(s): DDIMER in the last 72 hours. Hgb A1c No results for input(s): HGBA1C in the last 72 hours. Lipid Profile No results for input(s): CHOL, HDL, LDLCALC, TRIG, CHOLHDL, LDLDIRECT in the last 72 hours. Thyroid function studies No results for input(s): TSH, T4TOTAL, T3FREE, THYROIDAB in the last 72 hours.  Invalid input(s): FREET3 Anemia work up Recent Labs    08/30/17 1818  VITAMINB12 335  FOLATE 9.0  FERRITIN 191  TIBC 252  IRON 18*  RETICCTPCT 0.9   Urinalysis No  results found for: COLORURINE, APPEARANCEUR, [place], [place], GLUCOSEU, [place], [place], [place], PROTEINUR, UROBILINOGEN, NITRITE, LEUKOCYTESUR Sepsis Labs Invalid input(s): PROCALCITONIN,  WBC,  LACTICIDVEN Microbiology Recent Results (from the past 240 hour(s))  MRSA PCR Screening     Status: None   Collection Time: 08/30/17  7:56 PM  Result Value Ref Range Status   MRSA by PCR NEGATIVE NEGATIVE Final    Comment:        The GeneXpert MRSA Assay (FDA approved for NASAL specimens only), is one component of a comprehensive MRSA colonization surveillance program. It is not intended to diagnose MRSA infection nor to guide or monitor treatment for MRSA infections.    Time coordinating discharge: 35 minutes  SIGNED:  Kerney Elbe, DO [facility] 09/01/2017, 3:37 PM Pager [phone]  If 7PM-7AM, please contact night-coverage [email or web address] Password [email or web address]

## 2017-09-01 NOTE — NC FL2 (Signed)
[place] MEDICAID [id number] LEVEL OF CARE SCREENING TOOL     IDENTIFICATION  Patient Name: Shawn Pearson Birthdate: [birth year] Sex: male Admission Date (Current Location): 08/30/2017  [facility] and [place] Number:  Herbalist and Address:  [facility], [place] [address] [place], [place] [address]      Provider Number: [id number]  Attending Physician Name and Address:  Kerney Elbe, DO  Relative Name and Phone Number:       Current Level of Care: Hospital Recommended Level of Care: [place] Prior Approval Number:    Date Approved/Denied:   PASRR Number: [id number]  Discharge Plan: SNF    Current Diagnoses: Patient Active Problem List   Diagnosis Date Noted  . Anemia 08/30/2017  . Hypertension 08/30/2017  . Hypertensive urgency 08/21/2017  . DM2 (diabetes mellitus, type 2) ([place]) 08/21/2017  . CKD stage 3 secondary to diabetes ([place]) 08/21/2017  . Adenocarcinoma of rectum, stage 3 ([place]) 08/21/2017  . Hypertensive emergency 08/21/2017  . Chest pain     Orientation RESPIRATION BLADDER Height & Weight     Self, Time, Situation, Place  Normal Incontinent Weight: 212 lb 15.4 oz (96.6 kg) Height:  6\' 2"  (188 cm)  BEHAVIORAL SYMPTOMS/MOOD NEUROLOGICAL BOWEL NUTRITION STATUS  Other (Comment)(irritable, anxious, uncooperative)   Continent Diet(please see DC summary)  AMBULATORY STATUS COMMUNICATION OF NEEDS Skin   Extensive Assist Verbally Normal                       Personal Care Assistance Level of Assistance  Bathing, Feeding, Dressing Bathing Assistance: Maximum assistance Feeding assistance: Independent Dressing Assistance: Maximum assistance     Functional Limitations Info  Sight, Hearing, Speech Sight Info: Adequate Hearing Info: Adequate Speech Info: Adequate    SPECIAL CARE FACTORS FREQUENCY  PT (By licensed PT), OT (By licensed OT)     PT Frequency: 5x/week OT Frequency: 5x/week             Contractures Contractures Info: Not present    Additional Factors Info  Code Status, Allergies, Psychotropic, Insulin Sliding Scale Code Status Info: Full Allergies Info: No Known Allergies Psychotropic Info: buspar, prozac Insulin Sliding Scale Info: insulin 3x/day at bedtime and insulin at bedtime       Current Medications (09/01/2017):  This is the current hospital active medication list Current Facility-Administered Medications  Medication Dose Route Frequency Provider Last Rate Last Dose  . acetaminophen (TYLENOL) tablet 500 mg  500 mg Oral Once Gibbons, Claudia J, PA-C      . amLODipine (NORVASC) tablet 10 mg  10 mg Oral Daily Radene Gunning, NP   10 mg at 08/31/17 0943  . atorvastatin (LIPITOR) tablet 40 mg  40 mg Oral QHS Radene Gunning, NP   40 mg at 08/31/17 2318  . busPIRone (BUSPAR) tablet 5 mg  5 mg Oral TID Radene Gunning, NP   5 mg at 08/31/17 2319  . carvedilol (COREG) tablet 12.5 mg  12.5 mg Oral BID WC Carmon Sails J, PA-C   12.5 mg at 08/31/17 1714  . docusate sodium (COLACE) capsule 100 mg  100 mg Oral Daily PRN Black, Lezlie Octave, NP      . feeding supplement (GLUCERNA SHAKE) (GLUCERNA SHAKE) liquid 237 mL  237 mL Oral BID BM Sheikh, Omair Latif, DO   237 mL at 09/01/17 1255  . finasteride (PROSCAR) tablet 5 mg  5 mg Oral Daily Black, Lezlie Octave, NP  5 mg at 08/31/17 0943  . FLUoxetine (PROZAC) capsule 20 mg  20 mg Oral Daily Radene Gunning, NP   20 mg at 08/31/17 0943  . furosemide (LASIX) tablet 60 mg  60 mg Oral Daily Radene Gunning, NP   60 mg at 08/31/17 0943  . gabapentin (NEURONTIN) capsule 300 mg  300 mg Oral BID PRN Radene Gunning, NP   300 mg at 08/30/17 2010  . hydrALAZINE (APRESOLINE) tablet 10 mg  10 mg Oral PRN Sheikh, Omair Latif, DO      . insulin aspart (novoLOG) injection 0-5 Units  0-5 Units Subcutaneous QHS Radene Gunning, NP   3 Units at 08/30/17 2016  . insulin aspart (novoLOG) injection 0-9 Units  0-9 Units Subcutaneous TID WC Radene Gunning,  NP   2 Units at 08/31/17 1713  . insulin glargine (LANTUS) injection 6 Units  6 Units Subcutaneous QHS Radene Gunning, NP   6 Units at 08/31/17 2320  . losartan (COZAAR) tablet 50 mg  50 mg Oral Daily Radene Gunning, NP   50 mg at 08/31/17 0945  . nitroGLYCERIN (NITROSTAT) SL tablet 0.4 mg  0.4 mg Sublingual PRN Radene Gunning, NP      . ondansetron [facility]) tablet 4 mg  4 mg Oral PRN Radene Gunning, NP      . pantoprazole (PROTONIX) EC tablet 40 mg  40 mg Oral Daily Radene Gunning, NP   40 mg at 08/31/17 0944  . polyethylene glycol (MIRALAX / GLYCOLAX) packet 17 g  17 g Oral Daily Emokpae, Courage, MD   17 g at 08/31/17 0942  . potassium chloride SA (K-DUR,KLOR-CON) CR tablet 40 mEq  40 mEq Oral BID Ra[place]ord [place], DO   40 mEq at 09/01/17 0114  . tamsulosin (FLOMAX) capsule 0.4 mg  0.4 mg Oral QPC supper Radene Gunning, NP   0.4 mg at 08/31/17 1714  . Valproate Sodium (DEPAKENE) solution 500 mg  500 mg Oral BID Radene Gunning, NP   500 mg at 08/31/17 0945     Discharge Medications: Please see discharge summary for a list of discharge medications.  Relevant Imaging Results:  Relevant Lab Results:   Additional Information SSN: [id number]  [name], LCSW

## 2017-09-02 NOTE — Progress Notes (Signed)
Late entry due to missed g-code.   09-21-17 1200  OT G-codes **NOT FOR INPATIENT CLASS**  Functional Assessment Tool Used Clinical judgement  Functional Limitation Self care  Self Care Current Status [phone]) CL  Self Care Discharge Status [phone]) CL  21-Sep-2017 Nestor Lewandowsky, OTR/L Pager: [phone]

## 2017-09-05 ENCOUNTER — Emergency Department (HOSPITAL_COMMUNITY): Payer: Medicare Other

## 2017-09-05 ENCOUNTER — Inpatient Hospital Stay (HOSPITAL_COMMUNITY)
Admission: EM | Admit: 2017-09-05 | Discharge: 2017-11-06 | DRG: 304 | Disposition: A | Discharge status: Skilled Nursing Facility | Payer: Medicare Other | Attending: Internal Medicine | Admitting: Internal Medicine

## 2017-09-05 ENCOUNTER — Encounter (HOSPITAL_COMMUNITY): Payer: Self-pay | Admitting: Pharmacy Technician

## 2017-09-05 DIAGNOSIS — Z951 Presence of aortocoronary bypass graft: Secondary | ICD-10-CM

## 2017-09-05 DIAGNOSIS — Z794 Long term (current) use of insulin: Secondary | ICD-10-CM

## 2017-09-05 DIAGNOSIS — C20 Malignant neoplasm of rectum: Secondary | ICD-10-CM

## 2017-09-05 DIAGNOSIS — R451 Restlessness and agitation: Secondary | ICD-10-CM

## 2017-09-05 DIAGNOSIS — I160 Hypertensive urgency: Secondary | ICD-10-CM | POA: Diagnosis present

## 2017-09-05 DIAGNOSIS — C189 Malignant neoplasm of colon, unspecified: Secondary | ICD-10-CM

## 2017-09-05 DIAGNOSIS — R0602 Shortness of breath: Secondary | ICD-10-CM

## 2017-09-05 DIAGNOSIS — R109 Unspecified abdominal pain: Secondary | ICD-10-CM

## 2017-09-05 DIAGNOSIS — B370 Candidal stomatitis: Secondary | ICD-10-CM | POA: Diagnosis not present

## 2017-09-05 DIAGNOSIS — R7989 Other specified abnormal findings of blood chemistry: Secondary | ICD-10-CM

## 2017-09-05 DIAGNOSIS — N183 Chronic kidney disease, stage 3 unspecified: Secondary | ICD-10-CM | POA: Diagnosis present

## 2017-09-05 DIAGNOSIS — M791 Myalgia, unspecified site: Secondary | ICD-10-CM | POA: Diagnosis present

## 2017-09-05 DIAGNOSIS — R45851 Suicidal ideations: Secondary | ICD-10-CM | POA: Diagnosis present

## 2017-09-05 DIAGNOSIS — R51 Headache: Secondary | ICD-10-CM

## 2017-09-05 DIAGNOSIS — D649 Anemia, unspecified: Secondary | ICD-10-CM

## 2017-09-05 DIAGNOSIS — E1122 Type 2 diabetes mellitus with diabetic chronic kidney disease: Secondary | ICD-10-CM | POA: Diagnosis present

## 2017-09-05 DIAGNOSIS — I255 Ischemic cardiomyopathy: Secondary | ICD-10-CM | POA: Diagnosis present

## 2017-09-05 DIAGNOSIS — Z91128 Patient's intentional underdosing of medication regimen for other reason: Secondary | ICD-10-CM

## 2017-09-05 DIAGNOSIS — D500 Iron deficiency anemia secondary to blood loss (chronic): Secondary | ICD-10-CM | POA: Diagnosis present

## 2017-09-05 DIAGNOSIS — R140 Abdominal distension (gaseous): Secondary | ICD-10-CM

## 2017-09-05 DIAGNOSIS — K209 Esophagitis, unspecified: Secondary | ICD-10-CM | POA: Diagnosis not present

## 2017-09-05 DIAGNOSIS — Z89512 Acquired absence of left leg below knee: Secondary | ICD-10-CM

## 2017-09-05 DIAGNOSIS — R778 Other specified abnormalities of plasma proteins: Secondary | ICD-10-CM

## 2017-09-05 DIAGNOSIS — R519 Headache, unspecified: Secondary | ICD-10-CM

## 2017-09-05 DIAGNOSIS — N401 Enlarged prostate with lower urinary tract symptoms: Secondary | ICD-10-CM | POA: Diagnosis not present

## 2017-09-05 DIAGNOSIS — I2510 Atherosclerotic heart disease of native coronary artery without angina pectoris: Secondary | ICD-10-CM | POA: Diagnosis present

## 2017-09-05 DIAGNOSIS — I4581 Long QT syndrome: Secondary | ICD-10-CM | POA: Diagnosis present

## 2017-09-05 DIAGNOSIS — R45850 Homicidal ideations: Secondary | ICD-10-CM | POA: Diagnosis present

## 2017-09-05 DIAGNOSIS — G8929 Other chronic pain: Secondary | ICD-10-CM | POA: Diagnosis present

## 2017-09-05 DIAGNOSIS — Z95810 Presence of automatic (implantable) cardiac defibrillator: Secondary | ICD-10-CM

## 2017-09-05 DIAGNOSIS — E876 Hypokalemia: Secondary | ICD-10-CM | POA: Diagnosis not present

## 2017-09-05 DIAGNOSIS — I493 Ventricular premature depolarization: Secondary | ICD-10-CM | POA: Diagnosis present

## 2017-09-05 DIAGNOSIS — K5900 Constipation, unspecified: Secondary | ICD-10-CM | POA: Diagnosis not present

## 2017-09-05 DIAGNOSIS — F0151 Vascular dementia with behavioral disturbance: Secondary | ICD-10-CM | POA: Diagnosis present

## 2017-09-05 DIAGNOSIS — Z66 Do not resuscitate: Secondary | ICD-10-CM

## 2017-09-05 DIAGNOSIS — I130 Hypertensive heart and chronic kidney disease with heart failure and stage 1 through stage 4 chronic kidney disease, or unspecified chronic kidney disease: Secondary | ICD-10-CM | POA: Diagnosis present

## 2017-09-05 DIAGNOSIS — Z5329 Procedure and treatment not carried out because of patient's decision for other reasons: Secondary | ICD-10-CM | POA: Diagnosis present

## 2017-09-05 DIAGNOSIS — F319 Bipolar disorder, unspecified: Secondary | ICD-10-CM | POA: Diagnosis present

## 2017-09-05 DIAGNOSIS — D62 Acute posthemorrhagic anemia: Secondary | ICD-10-CM | POA: Diagnosis not present

## 2017-09-05 DIAGNOSIS — R079 Chest pain, unspecified: Secondary | ICD-10-CM | POA: Diagnosis present

## 2017-09-05 DIAGNOSIS — Z9114 Patient's other noncompliance with medication regimen: Secondary | ICD-10-CM

## 2017-09-05 DIAGNOSIS — Z79899 Other long term (current) drug therapy: Secondary | ICD-10-CM

## 2017-09-05 DIAGNOSIS — D631 Anemia in chronic kidney disease: Secondary | ICD-10-CM | POA: Diagnosis present

## 2017-09-05 DIAGNOSIS — Z923 Personal history of irradiation: Secondary | ICD-10-CM

## 2017-09-05 DIAGNOSIS — F0390 Unspecified dementia without behavioral disturbance: Secondary | ICD-10-CM | POA: Diagnosis present

## 2017-09-05 DIAGNOSIS — Z89511 Acquired absence of right leg below knee: Secondary | ICD-10-CM

## 2017-09-05 DIAGNOSIS — I248 Other forms of acute ischemic heart disease: Secondary | ICD-10-CM | POA: Diagnosis present

## 2017-09-05 DIAGNOSIS — Z91148 Patient's other noncompliance with medication regimen for other reason: Secondary | ICD-10-CM

## 2017-09-05 DIAGNOSIS — R338 Other retention of urine: Secondary | ICD-10-CM | POA: Diagnosis present

## 2017-09-05 DIAGNOSIS — Z515 Encounter for palliative care: Secondary | ICD-10-CM

## 2017-09-05 DIAGNOSIS — E1165 Type 2 diabetes mellitus with hyperglycemia: Secondary | ICD-10-CM | POA: Diagnosis present

## 2017-09-05 DIAGNOSIS — E1151 Type 2 diabetes mellitus with diabetic peripheral angiopathy without gangrene: Secondary | ICD-10-CM | POA: Diagnosis present

## 2017-09-05 DIAGNOSIS — I10 Essential (primary) hypertension: Secondary | ICD-10-CM

## 2017-09-05 DIAGNOSIS — K625 Hemorrhage of anus and rectum: Secondary | ICD-10-CM

## 2017-09-05 DIAGNOSIS — I5043 Acute on chronic combined systolic (congestive) and diastolic (congestive) heart failure: Secondary | ICD-10-CM | POA: Diagnosis not present

## 2017-09-05 DIAGNOSIS — I700 Atherosclerosis of aorta: Secondary | ICD-10-CM | POA: Diagnosis present

## 2017-09-05 LAB — COMPREHENSIVE METABOLIC PANEL
ALT: 8 U/L — AB (ref 17–63)
ANION GAP: 7 (ref 5–15)
AST: 18 U/L (ref 15–41)
Albumin: 2.4 g/dL — ABNORMAL LOW (ref 3.5–5.0)
Alkaline Phosphatase: 64 U/L (ref 38–126)
BILIRUBIN TOTAL: 0.4 mg/dL (ref 0.3–1.2)
BUN: 14 mg/dL (ref 6–20)
CALCIUM: 8.2 mg/dL — AB (ref 8.9–10.3)
CO2: 22 mmol/L (ref 22–32)
CREATININE: 1.46 mg/dL — AB (ref 0.61–1.24)
Chloride: 105 mmol/L (ref 101–111)
GFR, EST AFRICAN AMERICAN: 52 mL/min — AB (ref >60)
GFR, EST NON AFRICAN AMERICAN: 45 mL/min — AB (ref >60)
Glucose, Bld: 167 mg/dL — ABNORMAL HIGH (ref 65–99)
Potassium: 3.3 mmol/L — ABNORMAL LOW (ref 3.5–5.1)
Sodium: 134 mmol/L — ABNORMAL LOW (ref 135–145)
TOTAL PROTEIN: 6.2 g/dL — AB (ref 6.5–8.1)

## 2017-09-05 LAB — CBC WITH DIFFERENTIAL/PLATELET
BASOS PCT: 1 %
Basophils Absolute: 0 10*3/uL (ref 0.0–0.1)
Eosinophils Absolute: 0.4 10*3/uL (ref 0.0–0.7)
Eosinophils Relative: 6 %
HEMATOCRIT: 28.9 % — AB (ref 39.0–52.0)
HEMOGLOBIN: 9.4 g/dL — AB (ref 13.0–17.0)
LYMPHS ABS: 1.4 10*3/uL (ref 0.7–4.0)
Lymphocytes Relative: 20 %
MCH: 25.8 pg — AB (ref 26.0–34.0)
MCHC: 32.5 g/dL (ref 30.0–36.0)
MCV: 79.4 fL (ref 78.0–100.0)
MONOS PCT: 9 %
Monocytes Absolute: 0.7 10*3/uL (ref 0.1–1.0)
NEUTROS ABS: 4.7 10*3/uL (ref 1.7–7.7)
NEUTROS PCT: 64 %
Platelets: 404 10*3/uL — ABNORMAL HIGH (ref 150–400)
RBC: 3.64 MIL/uL — AB (ref 4.22–5.81)
RDW: 14.8 % (ref 11.5–15.5)
WBC: 7.3 10*3/uL (ref 4.0–10.5)

## 2017-09-05 LAB — I-STAT TROPONIN, ED: Troponin i, poc: 0.13 ng/mL (ref 0.00–0.08)

## 2017-09-05 LAB — TROPONIN I: Troponin I: 0.12 ng/mL (ref <0.03)

## 2017-09-05 MED ORDER — ASPIRIN 81 MG PO CHEW
324.0000 mg | CHEWABLE_TABLET | Freq: Once | ORAL | Status: AC
  Administered 2017-09-05: 324 mg via ORAL
  Filled 2017-09-05: qty 4

## 2017-09-05 MED ORDER — ENSURE ENLIVE PO LIQD
237.0000 mL | Freq: Two times a day (BID) | ORAL | Status: DC
  Filled 2017-09-05: qty 237

## 2017-09-05 MED ORDER — HYDRALAZINE HCL 20 MG/ML IJ SOLN
10.0000 mg | INTRAMUSCULAR | Status: AC
  Administered 2017-09-05: 10 mg via INTRAVENOUS
  Filled 2017-09-05: qty 1

## 2017-09-05 NOTE — ED Provider Notes (Signed)
[facility] EMERGENCY DEPARTMENT Provider Note   CSN: [id number] Arrival date & time: 09/05/17  2108     History   Chief Complaint Chief Complaint  Patient presents with  . Chest Pain  . Headache    HPI Yassin Winsor is a 76 y.o. 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  Patient was admitted for chest pain workup and discharged on August 22, 2017 after a reassuring cardiac workup.  He was again evaluated on 08/30/2017 for central chest pain and found to be elevated troponin at baseline.  He did have a decrease in his hemoglobin at that time but his Hemoccult was negative.  He was admitted for serial CBCs and observation.  Patient is also complaining of a headache today.  He reports it began acutely last night.  He denies vision changes, neck pain or fevers.  He denies nausea or vomiting.  Patient reports it is unusual for him to get a headache.  Fused IV, aspirin and nitro with EMS.  They reported that per nursing facility patient has refused all of his medications in the last 2 days.   The history is provided by the patient and medical records. No language interpreter was used.    Past Medical History:  Diagnosis Date  . Aortic atherosclerosis ([place])   . Dementia   . Diabetes mellitus without complication ([place])   . History of angina   . Hypertension   . Renal disorder    CKD stage 3    Patient Active Problem List   Diagnosis Date Noted  . Dementia 09/06/2017  . Noncompliance with medication regimen 09/06/2017  . Anemia 08/30/2017  . Hypertension 08/30/2017  . Hypertensive urgency 08/21/2017  . DM2 (diabetes mellitus, type 2) ([place]) 08/21/2017  . CKD stage 3  secondary to diabetes ([place]) 08/21/2017  . Adenocarcinoma of rectum, stage 3 ([place]) 08/21/2017  . Hypertensive emergency 08/21/2017  . Chest pain     Past Surgical History:  Procedure Laterality Date  . BELOW KNEE LEG AMPUTATION Bilateral        Home Medications    Prior to Admission medications   Medication Sig Start Date End Date Taking? Authorizing Provider  amLODipine (NORVASC) 10 MG tablet Take 10 mg by mouth daily.   Yes [provider]  atorvastatin (LIPITOR) 40 MG tablet Take 40 mg by mouth at bedtime. 03/30/17 03/30/18 Yes [provider]  finasteride (PROSCAR) 5 MG tablet Take 5 mg by mouth daily.   Yes [provider]  furosemide (LASIX) 40 MG tablet Take 60 mg by mouth daily. 08/13/17  Yes [provider]  haloperidol lactate (HALDOL) 5 MG/ML injection Inject 2 mg every 6 (six) hours as needed into the muscle.   Yes [provider]  Insulin Glargine (BASAGLAR KWIKPEN) 100 UNIT/ML SOPN Inject 6 Units into the skin at bedtime. 03/30/17  Yes [provider]  losartan (COZAAR) 50 MG tablet Take 50 mg by mouth daily.   Yes [provider]  nitroGLYCERIN (NITROSTAT) 0.4 MG SL tablet Place 0.4 mg as needed under the tongue for chest pain.  08/13/17  Yes [provider]  polyethylene glycol powder (GLYCOLAX/MIRALAX) powder Take 17 g by mouth daily. 08/13/17  Yes Forde Dandy, MD  tamsulosin (FLOMAX) 0.4 MG  CAPS capsule Take 0.4 mg daily by mouth.  08/13/17  Yes [provider]  carvedilol (COREG) 12.5 MG tablet Take 1 tablet (12.5 mg total) 2 (two) times daily with a meal by mouth. Patient not taking: Reported on 09/05/2017 09/01/17   Raiford Noble Latif, DO  feeding supplement, GLUCERNA SHAKE, (GLUCERNA SHAKE) LIQD Take 237 mLs 2 (two) times daily between meals by mouth. Patient not taking: Reported on 09/05/2017 09/02/17   Kerney Elbe, DO    Family History No family history on file.  Social  History Social History   Tobacco Use  . Smoking status: Unknown If Ever Smoked  . Smokeless tobacco: Never Used  Substance Use Topics  . Alcohol use: No  . Drug use: No     Allergies   Patient has no known allergies.   Review of Systems Review of Systems  Constitutional: Negative for appetite change, diaphoresis, fatigue, fever and unexpected weight change.  HENT: Negative for mouth sores.   Eyes: Negative for visual disturbance.  Respiratory: Negative for cough, chest tightness, shortness of breath and wheezing.   Cardiovascular: Positive for chest pain.  Gastrointestinal: Negative for abdominal pain, constipation, diarrhea, nausea and vomiting.  Endocrine: Negative for polydipsia, polyphagia and polyuria.  Genitourinary: Negative for dysuria, frequency, hematuria and urgency.  Musculoskeletal: Positive for myalgias ( Bilateral, BKA pain). Negative for back pain and neck stiffness.  Skin: Negative for rash.  Allergic/Immunologic: Negative for immunocompromised state.  Neurological: Positive for headaches. Negative for syncope and light-headedness.  Hematological: Does not bruise/bleed easily.  Psychiatric/Behavioral: Negative for sleep disturbance. The patient is not nervous/anxious.      Physical Exam Updated Vital Signs BP (!) 209/83 (BP Location: Right Arm)   Pulse 82   Temp 99 F (37.2 C) (Oral)   Resp (!) 28   Ht 6\' 2"  (1.88 m)   Wt 96.2 kg (212 lb)   SpO2 100%   BMI 27.22 kg/m   Physical Exam  Constitutional: He appears well-developed and well-nourished. No distress.  Awake, alert, nontoxic appearance  HENT:  Head: Normocephalic and atraumatic.  Mouth/Throat: Oropharynx is clear and moist. No oropharyngeal exudate.  Eyes: Conjunctivae and EOM are normal. Pupils are equal, round, and reactive to light. No scleral icterus.  Neck: Normal range of motion. Neck supple. No neck rigidity.  Cardiovascular: Normal rate, regular rhythm and intact distal pulses.    Pulses:      Radial pulses are 2+ on the right side, and 2+ on the left side.  Pulmonary/Chest: Effort normal and breath sounds normal. No respiratory distress. He has no wheezes.  Equal chest expansion ICD palpable in the left upper chest  Abdominal: Soft. Bowel sounds are normal. He exhibits no mass. There is no tenderness. There is no rebound and no guarding.  Musculoskeletal: Normal range of motion. He exhibits no edema.  Bilateral BKA, no open wounds  Neurological: He is alert. He has normal strength. No cranial nerve deficit or sensory deficit. GCS eye subscore is 4. GCS verbal subscore is 5. GCS motor subscore is 6.  Mental Status:  Alert, thought content appropriate, able to give somewhat coherent history. Speech fluent without evidence of aphasia. Able to follow commands.  Cranial Nerves:  II:  pupils equal, round, reactive to light III,IV, VI: ptosis not present, extra-ocular motions intact bilaterally  V,VII: smile symmetric, facial light touch sensation equal VIII: hearing grossly normal to voice  X: uvula elevates symmetrically  XI: bilateral shoulder shrug symmetric and strong XII: midline  tongue extension without fassiculations Motor:  Normal tone. 5/5 in upper extremities bilaterally including strong and equal grip strength  Sensory: light touch normal in all extremities.   Skin: Skin is warm and dry. He is not diaphoretic.  Psychiatric: He has a normal mood and affect.  Nursing note and vitals reviewed.    ED Treatments / Results  Labs (all labs ordered are listed, but only abnormal results are displayed) Labs Reviewed  CBC WITH DIFFERENTIAL/PLATELET - Abnormal; Notable for the following components:      Result Value   RBC 3.64 (*)    Hemoglobin 9.4 (*)    HCT 28.9 (*)    MCH 25.8 (*)    Platelets 404 (*)    All other components within normal limits  COMPREHENSIVE METABOLIC PANEL - Abnormal; Notable for the following components:   Sodium 134 (*)     Potassium 3.3 (*)    Glucose, Bld 167 (*)    Creatinine, Ser 1.46 (*)    Calcium 8.2 (*)    Total Protein 6.2 (*)    Albumin 2.4 (*)    ALT 8 (*)    GFR calc non Af Amer 45 (*)    GFR calc Af Amer 52 (*)    All other components within normal limits  TROPONIN I - Abnormal; Notable for the following components:   Troponin I 0.12 (*)    All other components within normal limits  I-STAT TROPONIN, ED - Abnormal; Notable for the following components:   Troponin i, poc 0.13 (*)    All other components within normal limits  CBG MONITORING, ED    ED ECG REPORT   Date: 09/06/2017  Rate: 82  Rhythm: paced normal sinus rhythm  Narrative Interpretation: Atrial-sensed ventricular-paced complexes  Old EKG Reviewed: unchanged  I have personally reviewed the EKG tracing and agree with the computerized printout as noted.  Radiology Dg Chest 2 View  Result Date: 09/05/2017 CLINICAL DATA:  Left-sided chest pain with headache onset 2 days ago. EXAM: CHEST  2 VIEW COMPARISON:  08/30/2017 FINDINGS: Cardiomegaly with moderate aortic atherosclerosis. The patient is status post median sternotomy. Left-sided pacemaker apparatus projects over the left lung base. Right atrial pacer and right ventricular leads are noted. A coronary sinus lead is also present. There is minimal atelectasis at the lung bases. There is mild central vascular congestion similar to prior. No significant pleural fluid collections or pneumothorax. No acute osseous appearing abnormality. IMPRESSION: Stable cardiomegaly with aortic atherosclerosis. Mild central vascular congestion similar prior. Electronically Signed   By: Ashley Royalty M.D.   On: 09/05/2017 23:21   Ct Head Wo Contrast  Result Date: 09/05/2017 CLINICAL DATA:  76 year old now with headache. EXAM: CT HEAD WITHOUT CONTRAST TECHNIQUE: Contiguous axial images were obtained from the base of the skull through the vertex without intravenous contrast. COMPARISON:  None. FINDINGS:  Brain: There is mild age-related atrophy and chronic microvascular ischemic changes. Areas of old infarct and encephalomalacia noted in the region of the basal ganglia bilaterally. There is no acute intracranial hemorrhage. No mass effect or midline shift noted. No extra-axial fluid collection. Vascular: No hyperdense vessel or unexpected calcification. Skull: Normal. Negative for fracture or focal lesion. Sinuses/Orbits: There is diffuse mucoperiosteal thickening of paranasal sinuses. There is partial opacification of multiple left mastoid air cells. The right mastoid air cells are clear. No air-fluid levels. Bilateral cataract surgeries. Other: There is diffuse subcutaneous edema of the scalp. There is a 2.0 x 0.9 cm focal thickening/scarring,  a small hematoma, or lesion in the right parietal scalp. Correlation with clinical exam recommended. IMPRESSION: 1. No acute intracranial hemorrhage. 2. Age-related atrophy chronic microvascular ischemic changes. 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 If symptoms persist, and there are no contraindications, MRI may provide better evaluation if clinically indicated. 3. Focal skin thickening or scarring of the right parietal scalp. Correlation with clinical exam recommended. Electronically Signed   By: Anner Crete M.D.   On: 09/05/2017 23:34    Procedures Procedures (including critical care time)  Angiocath insertion Performed by: Jarrett Soho Duffy Dantonio  Consent: Verbal consent obtained. Risks and benefits: risks, benefits and alternatives were discussed Time out: Immediately prior to procedure a "time out" was called to verify the correct patient, procedure, equipment, support staff and site/side marked as required.  Preparation: Patient was prepped and draped in the usual sterile fashion.  Vein Location: right basilic   Ultrasound Guided  Gauge: 20ga  Normal blood return and flush without difficulty Patient tolerance: Patient tolerated the procedure  well with no immediate complications.     Medications Ordered in ED Medications  feeding supplement (ENSURE ENLIVE) (ENSURE ENLIVE) liquid 237 mL (not administered)  hydrALAZINE (APRESOLINE) injection 10 mg (not administered)  amLODipine (NORVASC) tablet 10 mg (not administered)  losartan (COZAAR) tablet 50 mg (not administered)  furosemide (LASIX) tablet 60 mg (not administered)  hydrALAZINE (APRESOLINE) injection 10 mg (10 mg Intravenous Given 09/05/17 2310)  aspirin chewable tablet 324 mg (324 mg Oral Given 09/05/17 2310)     Initial Impression / Assessment and Plan / ED Course  I have reviewed the triage vital signs and the nursing notes.  Pertinent labs & imaging results that were available during my care of the patient were reviewed by me and considered in my medical decision making (see chart for details).  Clinical Course as of Sep 06 29  Sun Sep 06, 2017  0018 Discussed with Dr. Shanon Brow who will admit.  [HM]  0021 Manual BP: 200/95  Will attempt home meds and give additional dose of hydralazine  [HM]    Clinical Course User Index [HM] Tore Carreker, Jarrett Soho, PA-C    With chest pain and headache.  He has a history of dementia and has been refusing his medications per facility.  Patient arrives hypertensive and agitated.  He is able to be calmed with conversation and allows me to put in an ultrasound-guided IV.  He does agree to an aspirin as well.  Patient answers questions clearly but is unable to provide adequate timeline of his hypertension.  Records show that patient was recently admitted for dropping hemoglobin but it has improved to 9.4 today.  Mild hypokalemia.  Patient with elevated i-STAT and lab troponin.  Records show that he has had troponins elevated to as high as 0.45 in the past but before that troponins were in the 0.04 range.  With elevated trop and unclear timeline, pt will need admission for trending trop.  Pt takes hydralazine at home PRN.  He was given a dose  IV here in the department.  He has remained alert throughout his time here in the ED.    The patient was discussed with and seen by Dr. Jeneen Rinks who agrees with the treatment plan.  BP (!) 200/95 (BP Location: Right Arm) Comment: Jarrett Soho, PA is aware  Pulse 79   Temp 99 F (37.2 C) (Oral)   Resp 16   Ht 6\' 2"  (1.88 m)   Wt 96.2 kg (212 lb)   SpO2 99%  BMI 27.22 kg/m     Final Clinical Impressions(s) / ED Diagnoses   Final diagnoses:  Elevated troponin  Central chest pain  Medication noncompliance due to cognitive impairment  Hypertension, unspecified type  Bad headache    ED Discharge Orders    None       Loni Muse Gwenlyn Perking 09/06/17 Francisca December, MD 09/06/17 2009

## 2017-09-05 NOTE — ED Notes (Signed)
Pt refusing IV placement at this time

## 2017-09-05 NOTE — ED Triage Notes (Addendum)
Pt arrives via [facility] from [facility] with reports of L sided CP with headache onset 2 days ago. Pt refusing IV, ASA, Nitro with EMS. Hx dementia. Cardiac hx with ICD placement. VSS with EMS. Pt has refused all medications from [facility] the last 2 days per EMS.

## 2017-09-06 ENCOUNTER — Other Ambulatory Visit: Payer: Self-pay

## 2017-09-06 DIAGNOSIS — E1122 Type 2 diabetes mellitus with diabetic chronic kidney disease: Secondary | ICD-10-CM

## 2017-09-06 DIAGNOSIS — I5043 Acute on chronic combined systolic (congestive) and diastolic (congestive) heart failure: Secondary | ICD-10-CM | POA: Diagnosis not present

## 2017-09-06 DIAGNOSIS — R45851 Suicidal ideations: Secondary | ICD-10-CM | POA: Diagnosis present

## 2017-09-06 DIAGNOSIS — I160 Hypertensive urgency: Secondary | ICD-10-CM | POA: Diagnosis present

## 2017-09-06 DIAGNOSIS — D649 Anemia, unspecified: Secondary | ICD-10-CM | POA: Diagnosis not present

## 2017-09-06 DIAGNOSIS — I248 Other forms of acute ischemic heart disease: Secondary | ICD-10-CM | POA: Diagnosis present

## 2017-09-06 DIAGNOSIS — F0151 Vascular dementia with behavioral disturbance: Secondary | ICD-10-CM | POA: Diagnosis present

## 2017-09-06 DIAGNOSIS — N183 Chronic kidney disease, stage 3 (moderate): Secondary | ICD-10-CM

## 2017-09-06 DIAGNOSIS — F0390 Unspecified dementia without behavioral disturbance: Secondary | ICD-10-CM | POA: Diagnosis not present

## 2017-09-06 DIAGNOSIS — R079 Chest pain, unspecified: Secondary | ICD-10-CM

## 2017-09-06 DIAGNOSIS — I255 Ischemic cardiomyopathy: Secondary | ICD-10-CM | POA: Diagnosis present

## 2017-09-06 DIAGNOSIS — C189 Malignant neoplasm of colon, unspecified: Secondary | ICD-10-CM | POA: Diagnosis not present

## 2017-09-06 DIAGNOSIS — R140 Abdominal distension (gaseous): Secondary | ICD-10-CM | POA: Diagnosis not present

## 2017-09-06 DIAGNOSIS — B370 Candidal stomatitis: Secondary | ICD-10-CM | POA: Diagnosis not present

## 2017-09-06 DIAGNOSIS — K209 Esophagitis, unspecified: Secondary | ICD-10-CM | POA: Diagnosis not present

## 2017-09-06 DIAGNOSIS — E1165 Type 2 diabetes mellitus with hyperglycemia: Secondary | ICD-10-CM | POA: Diagnosis present

## 2017-09-06 DIAGNOSIS — Z9114 Patient's other noncompliance with medication regimen: Secondary | ICD-10-CM

## 2017-09-06 DIAGNOSIS — D62 Acute posthemorrhagic anemia: Secondary | ICD-10-CM | POA: Diagnosis not present

## 2017-09-06 DIAGNOSIS — K5900 Constipation, unspecified: Secondary | ICD-10-CM | POA: Diagnosis not present

## 2017-09-06 DIAGNOSIS — F0391 Unspecified dementia with behavioral disturbance: Secondary | ICD-10-CM

## 2017-09-06 DIAGNOSIS — I493 Ventricular premature depolarization: Secondary | ICD-10-CM | POA: Diagnosis present

## 2017-09-06 DIAGNOSIS — R4587 Impulsiveness: Secondary | ICD-10-CM | POA: Diagnosis not present

## 2017-09-06 DIAGNOSIS — Z794 Long term (current) use of insulin: Secondary | ICD-10-CM | POA: Diagnosis not present

## 2017-09-06 DIAGNOSIS — D500 Iron deficiency anemia secondary to blood loss (chronic): Secondary | ICD-10-CM | POA: Diagnosis present

## 2017-09-06 DIAGNOSIS — D631 Anemia in chronic kidney disease: Secondary | ICD-10-CM | POA: Diagnosis present

## 2017-09-06 DIAGNOSIS — I10 Essential (primary) hypertension: Secondary | ICD-10-CM | POA: Diagnosis not present

## 2017-09-06 DIAGNOSIS — I4581 Long QT syndrome: Secondary | ICD-10-CM | POA: Diagnosis present

## 2017-09-06 DIAGNOSIS — R451 Restlessness and agitation: Secondary | ICD-10-CM | POA: Diagnosis not present

## 2017-09-06 DIAGNOSIS — R51 Headache: Secondary | ICD-10-CM | POA: Diagnosis present

## 2017-09-06 DIAGNOSIS — R072 Precordial pain: Secondary | ICD-10-CM | POA: Diagnosis not present

## 2017-09-06 DIAGNOSIS — R109 Unspecified abdominal pain: Secondary | ICD-10-CM | POA: Diagnosis not present

## 2017-09-06 DIAGNOSIS — E876 Hypokalemia: Secondary | ICD-10-CM | POA: Diagnosis not present

## 2017-09-06 DIAGNOSIS — R0602 Shortness of breath: Secondary | ICD-10-CM | POA: Diagnosis not present

## 2017-09-06 DIAGNOSIS — E1151 Type 2 diabetes mellitus with diabetic peripheral angiopathy without gangrene: Secondary | ICD-10-CM | POA: Diagnosis present

## 2017-09-06 DIAGNOSIS — C20 Malignant neoplasm of rectum: Secondary | ICD-10-CM | POA: Diagnosis present

## 2017-09-06 DIAGNOSIS — K625 Hemorrhage of anus and rectum: Secondary | ICD-10-CM | POA: Diagnosis not present

## 2017-09-06 DIAGNOSIS — I130 Hypertensive heart and chronic kidney disease with heart failure and stage 1 through stage 4 chronic kidney disease, or unspecified chronic kidney disease: Secondary | ICD-10-CM | POA: Diagnosis present

## 2017-09-06 DIAGNOSIS — Z66 Do not resuscitate: Secondary | ICD-10-CM | POA: Diagnosis not present

## 2017-09-06 DIAGNOSIS — R748 Abnormal levels of other serum enzymes: Secondary | ICD-10-CM | POA: Diagnosis not present

## 2017-09-06 DIAGNOSIS — Z515 Encounter for palliative care: Secondary | ICD-10-CM | POA: Diagnosis not present

## 2017-09-06 DIAGNOSIS — F319 Bipolar disorder, unspecified: Secondary | ICD-10-CM | POA: Diagnosis present

## 2017-09-06 MED ORDER — ACETAMINOPHEN 325 MG PO TABS
650.0000 mg | ORAL_TABLET | ORAL | Status: DC | PRN
  Administered 2017-09-06 – 2017-11-01 (×28): 650 mg via ORAL
  Filled 2017-09-06 (×38): qty 2

## 2017-09-06 MED ORDER — POTASSIUM CHLORIDE 20 MEQ PO PACK
40.0000 meq | PACK | ORAL | Status: AC
  Administered 2017-09-06: 40 meq via ORAL
  Filled 2017-09-06 (×2): qty 2

## 2017-09-06 MED ORDER — FUROSEMIDE 40 MG PO TABS
60.0000 mg | ORAL_TABLET | Freq: Every day | ORAL | Status: DC
  Administered 2017-09-06 – 2017-09-08 (×2): 60 mg via ORAL
  Filled 2017-09-06 (×2): qty 1

## 2017-09-06 MED ORDER — AMLODIPINE BESYLATE 10 MG PO TABS
10.0000 mg | ORAL_TABLET | Freq: Every day | ORAL | Status: DC
  Administered 2017-09-08: 10 mg via ORAL
  Filled 2017-09-06 (×3): qty 1

## 2017-09-06 MED ORDER — POLYETHYLENE GLYCOL 3350 17 GM/SCOOP PO POWD: 17.0000 g | Freq: Every day | ORAL | Status: DC

## 2017-09-06 MED ORDER — NITROGLYCERIN 0.4 MG SL SUBL
0.4000 mg | SUBLINGUAL_TABLET | SUBLINGUAL | Status: DC | PRN
  Administered 2017-09-11 (×2): 0.4 mg via SUBLINGUAL
  Filled 2017-09-06 (×2): qty 1

## 2017-09-06 MED ORDER — LOSARTAN POTASSIUM 50 MG PO TABS
100.0000 mg | ORAL_TABLET | Freq: Every day | ORAL | Status: DC
  Administered 2017-09-06 – 2017-09-08 (×2): 100 mg via ORAL
  Filled 2017-09-06 (×2): qty 2

## 2017-09-06 MED ORDER — FUROSEMIDE 10 MG/ML IJ SOLN
40.0000 mg | Freq: Once | INTRAMUSCULAR | Status: DC
Start: 2017-09-06 — End: 2017-09-06

## 2017-09-06 MED ORDER — LOSARTAN POTASSIUM 50 MG PO TABS
50.0000 mg | ORAL_TABLET | Freq: Every day | ORAL | Status: DC
  Administered 2017-09-06: 50 mg via ORAL
  Filled 2017-09-06: qty 1

## 2017-09-06 MED ORDER — TAMSULOSIN HCL 0.4 MG PO CAPS
0.4000 mg | ORAL_CAPSULE | Freq: Every day | ORAL | Status: DC
  Administered 2017-09-06 – 2017-11-06 (×49): 0.4 mg via ORAL
  Filled 2017-09-06 (×56): qty 1

## 2017-09-06 MED ORDER — ONDANSETRON HCL 4 MG/2ML IJ SOLN
4.0000 mg | Freq: Four times a day (QID) | INTRAMUSCULAR | Status: DC | PRN
  Administered 2017-09-07: 4 mg via INTRAVENOUS
  Filled 2017-09-06: qty 2

## 2017-09-06 MED ORDER — GLUCERNA SHAKE PO LIQD: 237.0000 mL | Freq: Two times a day (BID) | ORAL | Status: DC

## 2017-09-06 MED ORDER — ENSURE ENLIVE PO LIQD
237.0000 mL | Freq: Two times a day (BID) | ORAL | Status: DC
  Administered 2017-09-06 – 2017-09-07 (×2): 237 mL via ORAL

## 2017-09-06 MED ORDER — FINASTERIDE 5 MG PO TABS
5.0000 mg | ORAL_TABLET | Freq: Every day | ORAL | Status: DC
  Administered 2017-09-06 – 2017-11-05 (×48): 5 mg via ORAL
  Filled 2017-09-06 (×55): qty 1

## 2017-09-06 MED ORDER — HYDRALAZINE HCL 20 MG/ML IJ SOLN
20.0000 mg | Freq: Once | INTRAMUSCULAR | Status: AC
  Administered 2017-09-06: 20 mg via INTRAVENOUS
  Filled 2017-09-06: qty 1

## 2017-09-06 MED ORDER — FUROSEMIDE 20 MG PO TABS
60.0000 mg | ORAL_TABLET | Freq: Once | ORAL | Status: AC
  Administered 2017-09-06: 60 mg via ORAL
  Filled 2017-09-06: qty 3

## 2017-09-06 MED ORDER — POLYVINYL ALCOHOL 1.4 % OP SOLN
1.0000 [drp] | OPHTHALMIC | Status: DC | PRN
  Administered 2017-09-23 – 2017-11-06 (×26): 1 [drp] via OPHTHALMIC
  Filled 2017-09-06 (×3): qty 15

## 2017-09-06 MED ORDER — LOSARTAN POTASSIUM 50 MG PO TABS: 50.0000 mg | ORAL_TABLET | Freq: Every day | ORAL | Status: DC

## 2017-09-06 MED ORDER — CARVEDILOL 12.5 MG PO TABS: 12.5000 mg | ORAL_TABLET | Freq: Two times a day (BID) | ORAL | Status: DC

## 2017-09-06 MED ORDER — ATORVASTATIN CALCIUM 40 MG PO TABS
40.0000 mg | ORAL_TABLET | Freq: Every day | ORAL | Status: DC
  Administered 2017-09-08 – 2017-10-09 (×29): 40 mg via ORAL
  Filled 2017-09-06 (×34): qty 1

## 2017-09-06 MED ORDER — ASPIRIN EC 325 MG PO TBEC
325.0000 mg | DELAYED_RELEASE_TABLET | Freq: Every day | ORAL | Status: DC
  Administered 2017-09-06 – 2017-10-10 (×25): 325 mg via ORAL
  Filled 2017-09-06 (×31): qty 1

## 2017-09-06 MED ORDER — INSULIN GLARGINE 100 UNIT/ML ~~LOC~~ SOLN
6.0000 [IU] | Freq: Every day | SUBCUTANEOUS | Status: DC
  Administered 2017-09-11 – 2017-09-18 (×7): 6 [IU] via SUBCUTANEOUS
  Filled 2017-09-06 (×13): qty 0.06

## 2017-09-06 MED ORDER — CARVEDILOL 3.125 MG PO TABS
3.1250 mg | ORAL_TABLET | Freq: Two times a day (BID) | ORAL | Status: DC
  Administered 2017-09-06 – 2017-09-08 (×3): 3.125 mg via ORAL
  Filled 2017-09-06 (×4): qty 1

## 2017-09-06 MED ORDER — ENOXAPARIN SODIUM 30 MG/0.3ML ~~LOC~~ SOLN: 30.0000 mg | Freq: Every day | SUBCUTANEOUS | Status: DC

## 2017-09-06 MED ORDER — POLYETHYLENE GLYCOL 3350 17 G PO PACK
17.0000 g | PACK | Freq: Every day | ORAL | Status: DC
  Administered 2017-09-08 – 2017-10-07 (×18): 17 g via ORAL
  Filled 2017-09-06 (×28): qty 1

## 2017-09-06 MED ORDER — HYDRALAZINE HCL 20 MG/ML IJ SOLN
10.0000 mg | INTRAMUSCULAR | Status: DC
  Filled 2017-09-06: qty 1

## 2017-09-06 MED ORDER — TRAMADOL HCL 50 MG PO TABS
50.0000 mg | ORAL_TABLET | Freq: Once | ORAL | Status: AC
  Administered 2017-09-06: 50 mg via ORAL
  Filled 2017-09-06: qty 1

## 2017-09-06 MED ORDER — AMLODIPINE BESYLATE 5 MG PO TABS
10.0000 mg | ORAL_TABLET | Freq: Once | ORAL | Status: AC
  Administered 2017-09-06: 10 mg via ORAL
  Filled 2017-09-06: qty 2

## 2017-09-06 NOTE — H&P (Signed)
History and Physical    Shawn Pearson HCW:[id number] DOB: [birth year] DOA: 09/05/2017  PCP: Mabeline Caras, NP  Patient coming from:  snf  Chief Complaint:  Chest pain, headache , highbp  HPI: Shawn Pearson is a 76 y.o. male with medical history significant of dementia, CKD, HTN , medical noncompliance wth meds, neg stress test in 2/18 comes in with chest pain and headache from SNF.  He says he does not like the nursing home he is at and that is why he refuses to take his meds.  They dont come when he calls for them.  He has no complaints right now and cannot give me details about his chest pain.  He denies pain right now and just wants to sleep.  He has been refusing his pills also here in the ED.  He is agreeable to taking them now.  Pt referred for admission for sbp over 200, and trop of 0.13 which is less than it has been in the past.    Review of Systems: As per HPI otherwise 10 point review of systems negative but highly probable unreliable  Past Medical History:  Diagnosis Date  . Aortic atherosclerosis ([place])   . Dementia   . Diabetes mellitus without complication ([place])   . History of angina   . Hypertension   . Renal disorder    CKD stage 3    Past Surgical History:  Procedure Laterality Date  . BELOW KNEE LEG AMPUTATION Bilateral      has an unknown smoking status. he has never used smokeless tobacco. He reports that he does not drink alcohol or use drugs.  No Known Allergies  No family history on file.  No premature CAD  Prior to Admission medications   Medication Sig Start Date End Date Taking? Authorizing Provider  amLODipine (NORVASC) 10 MG tablet Take 10 mg by mouth daily.   Yes [provider]  atorvastatin (LIPITOR) 40 MG tablet Take 40 mg by mouth at bedtime. 03/30/17 03/30/18 Yes [provider]  finasteride (PROSCAR) 5 MG tablet Take 5 mg by mouth daily.   Yes [provider]  furosemide (LASIX) 40 MG tablet Take 60 mg by mouth  daily. 08/13/17  Yes [provider]  haloperidol lactate (HALDOL) 5 MG/ML injection Inject 2 mg every 6 (six) hours as needed into the muscle.   Yes [provider]  Insulin Glargine (BASAGLAR KWIKPEN) 100 UNIT/ML SOPN Inject 6 Units into the skin at bedtime. 03/30/17  Yes [provider]  losartan (COZAAR) 50 MG tablet Take 50 mg by mouth daily.   Yes [provider]  nitroGLYCERIN (NITROSTAT) 0.4 MG SL tablet Place 0.4 mg as needed under the tongue for chest pain.  08/13/17  Yes [provider]  polyethylene glycol powder (GLYCOLAX/MIRALAX) powder Take 17 g by mouth daily. 08/13/17  Yes Forde Dandy, MD  tamsulosin (FLOMAX) 0.4 MG CAPS capsule Take 0.4 mg daily by mouth.  08/13/17  Yes [provider]  carvedilol (COREG) 12.5 MG tablet Take 1 tablet (12.5 mg total) 2 (two) times daily with a meal by mouth. Patient not taking: Reported on 09/05/2017 09/01/17   Raiford Noble Latif, DO  feeding supplement, GLUCERNA SHAKE, (GLUCERNA SHAKE) LIQD Take 237 mLs 2 (two) times daily between meals by mouth. Patient not taking: Reported on 09/05/2017 09/02/17   Raiford Noble [place], DO    Physical Exam: Vitals:   09/05/17 2145 09/05/17 2215 09/05/17 2310 09/06/17 0022  BP: (!) 201/106 Marland Kitchen)  204/98 (!) 220/100 (!) 200/95  Pulse: 81 79    Resp: 20 16    Temp:      TempSrc:      SpO2: 100% 99%    Weight:      Height:          Constitutional: NAD, calm, comfortable Vitals:   09/05/17 2145 09/05/17 2215 09/05/17 2310 09/06/17 0022  BP: (!) 201/106 (!) 204/98 (!) 220/100 (!) 200/95  Pulse: 81 79    Resp: 20 16    Temp:      TempSrc:      SpO2: 100% 99%    Weight:      Height:       Eyes: PERRL, lids and conjunctivae normal ENMT: Mucous membranes are moist. Posterior pharynx clear of any exudate or lesions.Normal dentition.  Neck: normal, supple, no masses, no thyromegaly Respiratory: clear to auscultation bilaterally, no wheezing, no  crackles. Normal respiratory effort. No accessory muscle use.  Cardiovascular: Regular rate and rhythm, no murmurs / rubs / gallops. No extremity edema. bilateral amputee Abdomen: no tenderness, no masses palpated. No hepatosplenomegaly. Bowel sounds positive.  Musculoskeletal: no clubbing / cyanosis. No joint deformity upper and lower extremities. Good ROM, no contractures. Normal muscle tone.  Skin: no rashes, lesions, ulcers. No induration Neurologic: CN 2-12 grossly intact. Sensation intact, DTR normal. Strength 5/5 in all 4.  Psychiatric: Normal judgment and insight. Alert and oriented x 3. Normal mood.    Labs on Admission: I have personally reviewed following labs and imaging studies  CBC: Recent Labs  Lab 08/30/17 1429 08/31/17 0349 08/31/17 2309 09/05/17 2210  WBC 8.2 7.5  --  7.3  NEUTROABS  --   --   --  4.7  HGB 8.7* 8.4* 8.8* 9.4*  HCT 26.9* 26.1* 26.8* 28.9*  MCV 79.8 80.8  --  79.4  PLT 354 347  --  427*   Basic Metabolic Panel: Recent Labs  Lab 08/30/17 1429 08/31/17 0349 09/05/17 2210  NA 133* 135 134*  K 3.6 3.4* 3.3*  CL 106 105 105  CO2 22 23 22   GLUCOSE 160* 219* 167*  BUN 13 17 14   CREATININE 1.38* 1.50* 1.46*  CALCIUM 7.9* 7.7* 8.2*   GFR: Estimated Creatinine Clearance: 49.3 mL/min (A) (by C-G formula based on SCr of 1.46 mg/dL (H)). Liver Function Tests: Recent Labs  Lab 09/05/17 2210  AST 18  ALT 8*  ALKPHOS 64  BILITOT 0.4  PROT 6.2*  ALBUMIN 2.4*   No results for input(s): LIPASE, AMYLASE in the last 168 hours. No results for input(s): AMMONIA in the last 168 hours. Coagulation Profile: No results for input(s): INR, PROTIME in the last 168 hours. Cardiac Enzymes: Recent Labs  Lab 08/31/17 1923 09/05/17 2305  TROPONINI 0.45* 0.12*   BNP (last 3 results) No results for input(s): PROBNP in the last 8760 hours. HbA1C: No results for input(s): HGBA1C in the last 72 hours. CBG: Recent Labs  Lab 08/31/17 0716 08/31/17 1120  08/31/17 1707 08/31/17 2129 09/01/17 0819  GLUCAP 164* 217* 177* 164* 126*   Lipid Profile: No results for input(s): CHOL, HDL, LDLCALC, TRIG, CHOLHDL, LDLDIRECT in the last 72 hours. Thyroid Function Tests: No results for input(s): TSH, T4TOTAL, FREET4, T3FREE, THYROIDAB in the last 72 hours. Anemia Panel: No results for input(s): VITAMINB12, FOLATE, FERRITIN, TIBC, IRON, RETICCTPCT in the last 72 hours. Urine analysis: No results found for: COLORURINE, APPEARANCEUR, LABSPEC, [place], GLUCOSEU, HGBUR, BILIRUBINUR, KETONESUR, PROTEINUR, UROBILINOGEN, NITRITE, LEUKOCYTESUR Sepsis Labs: !!!!!!!!!!!!!!!!!!!!!!!!!!!!!!!!!!!!!!!!!!!! @  LABRCNTIP(procalcitonin:4,lacticidven:4) ) Recent Results (from the past 240 hour(s))  MRSA PCR Screening     Status: None   Collection Time: 08/30/17  7:56 PM  Result Value Ref Range Status   MRSA by PCR NEGATIVE NEGATIVE Final    Comment:        The GeneXpert MRSA Assay (FDA approved for NASAL specimens only), is one component of a comprehensive MRSA colonization surveillance program. It is not intended to diagnose MRSA infection nor to guide or monitor treatment for MRSA infections.      Radiological Exams on Admission: Dg Chest 2 View  Result Date: 09/05/2017 CLINICAL DATA:  Left-sided chest pain with headache onset 2 days ago. EXAM: CHEST  2 VIEW COMPARISON:  08/30/2017 FINDINGS: Cardiomegaly with moderate aortic atherosclerosis. The patient is status post median sternotomy. Left-sided pacemaker apparatus projects over the left lung base. Right atrial pacer and right ventricular leads are noted. A coronary sinus lead is also present. There is minimal atelectasis at the lung bases. There is mild central vascular congestion similar to prior. No significant pleural fluid collections or pneumothorax. No acute osseous appearing abnormality. IMPRESSION: Stable cardiomegaly with aortic atherosclerosis. Mild central vascular congestion similar prior.  Electronically Signed   By: Ashley Royalty M.D.   On: 09/05/2017 23:21   Ct Head Wo Contrast  Result Date: 09/05/2017 CLINICAL DATA:  76 year old now with headache. EXAM: CT HEAD WITHOUT CONTRAST TECHNIQUE: Contiguous axial images were obtained from the base of the skull through the vertex without intravenous contrast. COMPARISON:  None. FINDINGS: Brain: There is mild age-related atrophy and chronic microvascular ischemic changes. Areas of old infarct and encephalomalacia noted in the region of the basal ganglia bilaterally. There is no acute intracranial hemorrhage. No mass effect or midline shift noted. No extra-axial fluid collection. Vascular: No hyperdense vessel or unexpected calcification. Skull: Normal. Negative for fracture or focal lesion. Sinuses/Orbits: There is diffuse mucoperiosteal thickening of paranasal sinuses. There is partial opacification of multiple left mastoid air cells. The right mastoid air cells are clear. No air-fluid levels. Bilateral cataract surgeries. Other: There is diffuse subcutaneous edema of the scalp. There is a 2.0 x 0.9 cm focal thickening/scarring, a small hematoma, or lesion in the right parietal scalp. Correlation with clinical exam recommended. IMPRESSION: 1. No acute intracranial hemorrhage. 2. Age-related atrophy chronic microvascular ischemic changes. 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 If symptoms persist, and there are no contraindications, MRI may provide better evaluation if clinically indicated. 3. Focal skin thickening or scarring of the right parietal scalp. Correlation with clinical exam recommended. Electronically Signed   By: Anner Crete M.D.   On: 09/05/2017 23:34    EKG: Independently reviewed.  Vpaced, nsr   Assessment/Plan 76 yo male with dementia, medical noncompliance, uncontrolled bp, mild elevation of trop  Principal Problem:   Chest pain- this seems chronic.  Stress testing in feb neg.  Aspirin.  Serial trop.  Control bp  Active  Problems:   Hypertensive urgency- give home meds now if he takes them, hydralazine iv given in ED.  He has clonidine patch on which is a good idea due to his noncompliance.     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

## 2017-09-06 NOTE — Plan of Care (Signed)
  Health Behavior/Discharge Planning: Ability to manage health-related needs will improve 09/06/2017 0316 - Not Progressing by Tristan Schroeder, RN   Coping: Level of anxiety will decrease 09/06/2017 0316 - Not Progressing by Tristan Schroeder, RN   Clinical Measurements: Diagnostic test results will improve 09/06/2017 0316 - Progressing by Tristan Schroeder, RN   Pain Managment: General experience of comfort will improve 09/06/2017 0316 - Progressing by Tristan Schroeder, RN

## 2017-09-06 NOTE — Progress Notes (Signed)
Patient refuses to get glucose checked and refusing to wear fall risk bracelet. Patient educated. Patient is also refusing to answer any questions right now, will let patient rest.

## 2017-09-06 NOTE — Progress Notes (Signed)
New Admission Note:   Arrival Method: Ed bed with ED tech, patient transferred to unit bed by 3 people. Mental Orientation: A&O x3, disoriented to place (thinks he's still at nursing home) Telemetry: initiated and verified   Skin: bilateral BKA Pain: 10/10 legs Safety Measures: implemented   Orders to be reviewed and implemented. Will continue to monitor the patient. Call light has been placed within reach and bed alarm has been activated.   Riki Altes, RN Phone: [phone]

## 2017-09-06 NOTE — Progress Notes (Signed)
Patient refused to wear cardiac monitoring, MD paged and orders received to D/C tele.

## 2017-09-06 NOTE — Progress Notes (Signed)
Patient refusing all labs and to take any medications at this time.

## 2017-09-06 NOTE — H&P (Signed)
PROGRESS NOTE    Shawn Pearson  HYI:[id number] DOB: 1940-11-03 DOA: 09/05/2017 PCP: Mabeline Caras, NP  Outpatient Specialists:     Brief Narrative:  Patient is a 76 year old male with past medical history significant for dementia, CKD, HTN , medical noncompliance wth meds, neg stress test in 2/18. Patient was admitted from SNF with chest pain, uncontrolled blood pressure due to non compliance with medication and headache.  He says he does not like the nursing home he is at and that is why he refuses to take his meds. The patient has been non co operative with the Nursing staff, and still refusing his medication. Psych has been consulted as patient likely has dementia with behavioral problems.   Currently, patient has no chest pain, no headache, and BP is better controlled. Will continue to follow troponin trend.  Assessment & Plan:   Principal Problem:   Chest pain Active Problems:   Hypertensive urgency   CKD stage 3 secondary to diabetes ([place])   Dementia   Noncompliance with medication regimen   - Await Psych input - Low threshold to start antipsychotics - Optimize BP - Increase Losartan to 100 mg po daily and start coreg 3.125 mg po bid, but patient refuses his medication. - Case management and social worker assistance with disposition   DVT prophylaxis: [place] Lovenox Code Status: Full Family Communication:  Disposition Plan: SNF eventually   Consultants:   Psych consulted  Procedures:     Antimicrobials:       Subjective: No significant history from patient. Patient is not co operative with Nursing staff, refusing medication etc.  Objective: Vitals:   09/06/17 0100 09/06/17 0105 09/06/17 0205 09/06/17 0652  BP: (!) 170/68 (!) 149/82 (!) 165/69 (!) 170/70  Pulse: 83 88 87 90  Resp: 17 (!) 21 20 20   Temp:   98.2 F (36.8 C) 98 F (36.7 C)  TempSrc:   Oral Oral  SpO2: 100% 100% 99% 97%  Weight:   107.9 kg (237 lb 12.8 oz)   Height:   6\' 2"  (1.88 m)      Intake/Output Summary (Last 24 hours) at 09/06/2017 0925 Last data filed at 09/06/2017 7867 Gross per 24 hour  Intake 480 ml  Output 450 ml  Net 30 ml   Filed Weights   09/05/17 2112 09/06/17 0205  Weight: 96.2 kg (212 lb) 107.9 kg (237 lb 12.8 oz)    Examination:  General exam: Appears calm and comfortable  Respiratory system: Clear to auscultation.  Cardiovascular system: S1 & S2. Gastrointestinal system: Abdomen is nondistended, soft and nontender. No organomegaly or masses felt. Normal bowel sounds heard. Central nervous system: Awake and Alert. Extremities: Bilateral BKA.     Data Reviewed: I have personally reviewed following labs and imaging studies  CBC: Recent Labs  Lab 08/30/17 1429 08/31/17 0349 08/31/17 2309 09/05/17 2210  WBC 8.2 7.5  --  7.3  NEUTROABS  --   --   --  4.7  HGB 8.7* 8.4* 8.8* 9.4*  HCT 26.9* 26.1* 26.8* 28.9*  MCV 79.8 80.8  --  79.4  PLT 354 347  --  672*   Basic Metabolic Panel: Recent Labs  Lab 08/30/17 1429 08/31/17 0349 09/05/17 2210  NA 133* 135 134*  K 3.6 3.4* 3.3*  CL 106 105 105  CO2 22 23 22   GLUCOSE 160* 219* 167*  BUN 13 17 14   CREATININE 1.38* 1.50* 1.46*  CALCIUM 7.9* 7.7* 8.2*   GFR: Estimated Creatinine Clearance: 55.4  mL/min (A) (by C-G formula based on SCr of 1.46 mg/dL (H)). Liver Function Tests: Recent Labs  Lab 09/05/17 2210  AST 18  ALT 8*  ALKPHOS 64  BILITOT 0.4  PROT 6.2*  ALBUMIN 2.4*   No results for input(s): LIPASE, AMYLASE in the last 168 hours. No results for input(s): AMMONIA in the last 168 hours. Coagulation Profile: No results for input(s): INR, PROTIME in the last 168 hours. Cardiac Enzymes: Recent Labs  Lab 08/31/17 1923 09/05/17 2305  TROPONINI 0.45* 0.12*   BNP (last 3 results) No results for input(s): PROBNP in the last 8760 hours. HbA1C: No results for input(s): HGBA1C in the last 72 hours. CBG: Recent Labs  Lab 08/31/17 0716 08/31/17 1120 08/31/17 1707  08/31/17 2129 09/01/17 0819  GLUCAP 164* 217* 177* 164* 126*   Lipid Profile: No results for input(s): CHOL, HDL, LDLCALC, TRIG, CHOLHDL, LDLDIRECT in the last 72 hours. Thyroid Function Tests: No results for input(s): TSH, T4TOTAL, FREET4, T3FREE, THYROIDAB in the last 72 hours. Anemia Panel: No results for input(s): VITAMINB12, FOLATE, FERRITIN, TIBC, IRON, RETICCTPCT in the last 72 hours. Urine analysis: No results found for: COLORURINE, APPEARANCEUR, LABSPEC, [place], GLUCOSEU, HGBUR, BILIRUBINUR, [place], PROTEINUR, UROBILINOGEN, NITRITE, LEUKOCYTESUR Sepsis Labs: @LABRCNTIP (procalcitonin:4,lacticidven:4)  ) Recent Results (from the past 240 hour(s))  MRSA PCR Screening     Status: None   Collection Time: 08/30/17  7:56 PM  Result Value Ref Range Status   MRSA by PCR NEGATIVE NEGATIVE Final    Comment:        The GeneXpert MRSA Assay (FDA approved for NASAL specimens only), is one component of a comprehensive MRSA colonization surveillance program. It is not intended to diagnose MRSA infection nor to guide or monitor treatment for MRSA infections.          Radiology Studies: Dg Chest 2 View  Result Date: 09/05/2017 CLINICAL DATA:  Left-sided chest pain with headache onset 2 days ago. EXAM: CHEST  2 VIEW COMPARISON:  08/30/2017 FINDINGS: Cardiomegaly with moderate aortic atherosclerosis. The patient is status post median sternotomy. Left-sided pacemaker apparatus projects over the left lung base. Right atrial pacer and right ventricular leads are noted. A coronary sinus lead is also present. There is minimal atelectasis at the lung bases. There is mild central vascular congestion similar to prior. No significant pleural fluid collections or pneumothorax. No acute osseous appearing abnormality. IMPRESSION: Stable cardiomegaly with aortic atherosclerosis. Mild central vascular congestion similar prior. Electronically Signed   By: Ashley Royalty M.D.   On: 09/05/2017 23:21     Ct Head Wo Contrast  Result Date: 09/05/2017 CLINICAL DATA:  76 year old now with headache. EXAM: CT HEAD WITHOUT CONTRAST TECHNIQUE: Contiguous axial images were obtained from the base of the skull through the vertex without intravenous contrast. COMPARISON:  None. FINDINGS: Brain: There is mild age-related atrophy and chronic microvascular ischemic changes. Areas of old infarct and encephalomalacia noted in the region of the basal ganglia bilaterally. There is no acute intracranial hemorrhage. No mass effect or midline shift noted. No extra-axial fluid collection. Vascular: No hyperdense vessel or unexpected calcification. Skull: Normal. Negative for fracture or focal lesion. Sinuses/Orbits: There is diffuse mucoperiosteal thickening of paranasal sinuses. There is partial opacification of multiple left mastoid air cells. The right mastoid air cells are clear. No air-fluid levels. Bilateral cataract surgeries. Other: There is diffuse subcutaneous edema of the scalp. There is a 2.0 x 0.9 cm focal thickening/scarring, a small hematoma, or lesion in the right parietal scalp. Correlation with  clinical exam recommended. IMPRESSION: 1. No acute intracranial hemorrhage. 2. Age-related atrophy chronic microvascular ischemic changes. 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 If symptoms persist, and there are no contraindications, MRI may provide better evaluation if clinically indicated. 3. Focal skin thickening or scarring of the right parietal scalp. Correlation with clinical exam recommended. Electronically Signed   By: Anner Crete M.D.   On: 09/05/2017 23:34        Scheduled Meds: . amLODipine  10 mg Oral Daily  . aspirin EC  325 mg Oral Daily  . atorvastatin  40 mg Oral QHS  . carvedilol  3.125 mg Oral BID WC  . finasteride  5 mg Oral Daily  . furosemide  60 mg Oral Daily  . insulin glargine  6 Units Subcutaneous QHS  . losartan  100 mg Oral Daily  . polyethylene glycol  17 g Oral Daily  .  potassium chloride  40 mEq Oral Q4H  . tamsulosin  0.4 mg Oral Daily   Continuous Infusions:   LOS: 0 days    Time spent: 62 Minutes    Dana Allan, MD  [facility] Pager #: [phone] 7PM-7AM contact night coverage as above

## 2017-09-06 NOTE — Plan of Care (Signed)
Patient very irritable during shift, cussing and striking out at staff.  Other times patient will not respond to staff when asked questions.  Patient did finally agree to take some of his medications.  Eating very little food but will drink Ensure and drink tea and juice.

## 2017-09-07 ENCOUNTER — Encounter (HOSPITAL_COMMUNITY): Payer: Self-pay

## 2017-09-07 DIAGNOSIS — R451 Restlessness and agitation: Secondary | ICD-10-CM

## 2017-09-07 MED ORDER — ENSURE ENLIVE PO LIQD
237.0000 mL | Freq: Three times a day (TID) | ORAL | Status: DC
  Administered 2017-09-07 – 2017-09-13 (×17): 237 mL via ORAL

## 2017-09-07 MED ORDER — INSULIN ASPART 100 UNIT/ML ~~LOC~~ SOLN
0.0000 [IU] | Freq: Three times a day (TID) | SUBCUTANEOUS | Status: DC
  Administered 2017-09-08 – 2017-09-11 (×2): 2 [IU] via SUBCUTANEOUS
  Administered 2017-09-12: 3 [IU] via SUBCUTANEOUS
  Administered 2017-09-15: 9 [IU] via SUBCUTANEOUS
  Administered 2017-09-19: 3 [IU] via SUBCUTANEOUS
  Administered 2017-09-19: 5 [IU] via SUBCUTANEOUS
  Administered 2017-09-19 – 2017-09-20 (×3): 7 [IU] via SUBCUTANEOUS
  Administered 2017-09-21: 3 [IU] via SUBCUTANEOUS
  Administered 2017-09-21: 9 [IU] via SUBCUTANEOUS
  Administered 2017-09-21: 5 [IU] via SUBCUTANEOUS
  Administered 2017-09-22: 3 [IU] via SUBCUTANEOUS
  Administered 2017-09-22: 2 [IU] via SUBCUTANEOUS
  Administered 2017-09-22: 3 [IU] via SUBCUTANEOUS
  Administered 2017-09-23: 2 [IU] via SUBCUTANEOUS
  Administered 2017-09-23 (×2): 5 [IU] via SUBCUTANEOUS
  Administered 2017-09-24: 3 [IU] via SUBCUTANEOUS
  Administered 2017-09-24: 2 [IU] via SUBCUTANEOUS
  Administered 2017-09-24 (×2): 3 [IU] via SUBCUTANEOUS
  Administered 2017-09-25: 9 [IU] via SUBCUTANEOUS
  Administered 2017-09-25: 7 [IU] via SUBCUTANEOUS
  Administered 2017-09-26 (×3): 2 [IU] via SUBCUTANEOUS
  Administered 2017-09-27: 3 [IU] via SUBCUTANEOUS
  Administered 2017-09-27: 1 [IU] via SUBCUTANEOUS
  Administered 2017-09-27: 3 [IU] via SUBCUTANEOUS
  Administered 2017-09-28: 5 [IU] via SUBCUTANEOUS
  Administered 2017-09-29: 1 [IU] via SUBCUTANEOUS
  Administered 2017-09-29 – 2017-09-30 (×3): 3 [IU] via SUBCUTANEOUS
  Administered 2017-09-30 (×2): 1 [IU] via SUBCUTANEOUS
  Administered 2017-10-01 – 2017-10-03 (×6): 3 [IU] via SUBCUTANEOUS
  Administered 2017-10-04: 2 [IU] via SUBCUTANEOUS
  Administered 2017-10-05 – 2017-10-07 (×3): 3 [IU] via SUBCUTANEOUS
  Administered 2017-10-07: 2 [IU] via SUBCUTANEOUS
  Administered 2017-10-09 – 2017-10-10 (×2): 1 [IU] via SUBCUTANEOUS
  Administered 2017-10-10 – 2017-10-11 (×3): 2 [IU] via SUBCUTANEOUS
  Administered 2017-10-11: 1 [IU] via SUBCUTANEOUS
  Administered 2017-10-12 – 2017-10-14 (×2): 2 [IU] via SUBCUTANEOUS
  Administered 2017-10-14 – 2017-10-16 (×3): 3 [IU] via SUBCUTANEOUS
  Administered 2017-10-16 – 2017-10-17 (×4): 2 [IU] via SUBCUTANEOUS
  Administered 2017-10-18: 1 [IU] via SUBCUTANEOUS
  Administered 2017-10-18: 2 [IU] via SUBCUTANEOUS
  Administered 2017-10-19: 3 [IU] via SUBCUTANEOUS
  Administered 2017-10-19: 2 [IU] via SUBCUTANEOUS
  Administered 2017-10-20: 1 [IU] via SUBCUTANEOUS
  Administered 2017-10-21: 2 [IU] via SUBCUTANEOUS
  Administered 2017-10-22: 3 [IU] via SUBCUTANEOUS
  Administered 2017-10-24: 2 [IU] via SUBCUTANEOUS
  Administered 2017-10-24: 3 [IU] via SUBCUTANEOUS
  Administered 2017-10-25: 1 [IU] via SUBCUTANEOUS
  Administered 2017-10-25 – 2017-10-26 (×2): 2 [IU] via SUBCUTANEOUS
  Administered 2017-10-26: 3 [IU] via SUBCUTANEOUS
  Administered 2017-10-27: 2 [IU] via SUBCUTANEOUS
  Administered 2017-10-27 – 2017-10-28 (×3): 1 [IU] via SUBCUTANEOUS
  Administered 2017-10-29: 2 [IU] via SUBCUTANEOUS
  Administered 2017-10-29: 1 [IU] via SUBCUTANEOUS
  Administered 2017-10-30: 2 [IU] via SUBCUTANEOUS
  Administered 2017-10-31: 3 [IU] via SUBCUTANEOUS
  Administered 2017-10-31: 1 [IU] via SUBCUTANEOUS
  Administered 2017-10-31 – 2017-11-01 (×3): 3 [IU] via SUBCUTANEOUS
  Administered 2017-11-02: 2 [IU] via SUBCUTANEOUS
  Administered 2017-11-03: 1 [IU] via SUBCUTANEOUS
  Administered 2017-11-03: 3 [IU] via SUBCUTANEOUS
  Administered 2017-11-04 (×2): 2 [IU] via SUBCUTANEOUS
  Administered 2017-11-04: 1 [IU] via SUBCUTANEOUS

## 2017-09-07 MED ORDER — TRAMADOL HCL 50 MG PO TABS
50.0000 mg | ORAL_TABLET | Freq: Once | ORAL | Status: DC
  Filled 2017-09-07: qty 1

## 2017-09-07 MED ORDER — ENOXAPARIN SODIUM 40 MG/0.4ML ~~LOC~~ SOLN
40.0000 mg | Freq: Every day | SUBCUTANEOUS | Status: DC
  Administered 2017-09-08 – 2017-10-03 (×15): 40 mg via SUBCUTANEOUS
  Filled 2017-09-07 (×22): qty 0.4

## 2017-09-07 NOTE — Progress Notes (Signed)
Pt refused.

## 2017-09-07 NOTE — Progress Notes (Addendum)
Patient refused for staff or nursing student to do the following: Obtain vital signs Answer questions required to complete head to toe assessment, such as his birthdate and current date for orientation, radial pulse check, breath sounds.  He also would not answer if in pain or not.  He refused to let me place yellow arm band on him.  Yellow socks tied to bed and bed alram on.    He cussed at NT when she entered his room and yelled at nursing student.  Patient had conversation with me regarding setting up his breakfast tray, which was done.

## 2017-09-07 NOTE — Plan of Care (Signed)
Pt. Diuresing well.

## 2017-09-07 NOTE — Plan of Care (Signed)
Pt calling staff with call bell for the majority of the shift; has not slept at all. No acute issues overnight.

## 2017-09-07 NOTE — Progress Notes (Signed)
Initial Nutrition Assessment  DOCUMENTATION CODES:   Obesity unspecified  INTERVENTION:   -Increase Ensure Enlive po to TID, each supplement provides 350 kcal and 20 grams of protein  NUTRITION DIAGNOSIS:   Inadequate oral intake related to lethargy/confusion as evidenced by meal completion < 25%.  GOAL:   Patient will meet greater than or equal to 90% of their needs  MONITOR:   PO intake, Supplement acceptance, Labs, Weight trends, Skin, I & O's  REASON FOR ASSESSMENT:   Malnutrition Screening Tool    ASSESSMENT:   Patient is a 76 year old male with past medical history significant for dementia, CKD, HTN , medical noncompliance wth meds, neg stress test in 2/18. Patient was admitted from SNF with chest pain, uncontrolled blood pressure due to non compliance with medication and headache.  He says he does not like the nursing home he is at and that is why he refuses to take his meds. The patient has been non co operative with the Nursing staff, and still refusing his medication. Psych has been consulted as patient likely has dementia with behavioral problems.   Pt admitted with chest pain.   Pt familiar to this RD due to recent prior admission. Pt sitting up in bed at time of visit, but did not respond to this RD when he was greeted or asked questions.   Documented meal completion 25-100%. Per nursing notes, pt refusing most care. He will consume very little food, but consumes liquids like Ensure and juices. Observed meal tray at bedside; pt consumed only chocolate pudding off tray. Pt awaiting psych consult. Per MAR, pt also refusing most medications.   Reviewed wt hx; per [facility] records, pt 223# in 02/05/17.   Inpatient orders for glycemic control are 6 units insulin glargine q HS. Pt with poor oral intake and would benefit from nutrient dense supplement. One Ensure Enlive supplement provides 350 kcals, 20 grams protein, and 44-45 grams of carbohydrate vs one Glucerna  shake supplement, which provides 220 kcals, 10 grams of protein, and 26 grams of carbohydrate. Given pt's hx of DM, RD will continue to monitor PO intake, CBGS, and adjust supplement regimen as appropriate.   Labs reviewed: Na: 134, K: 3.3.   NUTRITION - FOCUSED PHYSICAL EXAM:    Most Recent Value  Orbital Region  No depletion  Upper Arm Region  No depletion  Thoracic and Lumbar Region  No depletion  Buccal Region  No depletion  Temple Region  No depletion  Clavicle Bone Region  No depletion  Clavicle and Acromion Bone Region  No depletion  Scapular Bone Region  No depletion  Dorsal Hand  No depletion  Patellar Region  No depletion  Anterior Thigh Region  No depletion  Posterior Calf Region  No depletion  Edema (RD Assessment)  None  Hair  Reviewed  Eyes  Reviewed  Mouth  Reviewed  Skin  Reviewed  Nails  Reviewed       Diet Order:  Diet Heart Room service appropriate? Yes; Fluid consistency: Thin  EDUCATION NEEDS:   Not appropriate for education at this time  Skin:  Skin Assessment: Reviewed RN Assessment  Last BM:  09/06/17  Height:   Ht Readings from Last 1 Encounters:  09/06/17 6\' 2"  (1.88 m)    Weight:   Wt Readings from Last 1 Encounters:  09/06/17 237 lb 12.8 oz (107.9 kg)    Ideal Body Weight:  75.1 kg  BMI:  Body mass index is 30.53 kg/m.  Estimated Nutritional  Needs:   Kcal:  1650-1850  Protein:  80-95 grams  Fluid:  1.6-1.8 L    Clete Kuch A. Jimmye Norman, RD, LDN, CDE Pager: [phone] After hours Pager: [phone]

## 2017-09-07 NOTE — Consult Note (Signed)
[facility] Face-to-Face Psychiatry Consult   Reason for Consult: Agitation and refusing care. Referring Physician:  Dr. Myers Patient Identification: Shawn Pearson MRN:  [id number] Principal Diagnosis: Agitation Diagnosis:   Patient Active Problem List   Diagnosis Date Noted  . Dementia [F03.90] 09/06/2017  . Noncompliance with medication regimen [Z91.14] 09/06/2017  . Anemia [D64.9] 08/30/2017  . Hypertension [I10] 08/30/2017  . Hypertensive urgency [I16.0] 08/21/2017  . DM2 (diabetes mellitus, type 2) (HCC) [E11.9] 08/21/2017  . CKD stage 3 secondary to diabetes (HCC) [E11.22, N18.3] 08/21/2017  . 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 Hypertensive emergency [I16.1] 08/21/2017  . Chest pain [R07.9]     Total Time spent with patient: 1 hour  Subjective:   Shawn Pearson is a 76 y.o. male patient admitted with chest pain.  HPI:   Per chart review, patient was admitted with chest pain in the setting of uncontrolled blood pressure secondary to poor medication compliance. Since hospitalization, has been refusing care, agitated and aggressive towards staff and has not been sleeping. His appetite has been poor although he is drinking fluids including Ensure. Home medications include Haldol 2 mg q 6 hours PRN.   On interview, Shawn Pearson reports that he is feeling "bad" because he feels sick. He reports that he is supposed to receive medication but his IV came out. He does not cooperate with additional questions. He reports, "I don't know what you're talking about" when asked if he is homicidal or suicidal. He does not respond when asked if he has AVH. He reports that he does not know why he was admitted to the hospital when asked about chest pain.   Past Psychiatric History: Patient denies a history of depression or anxiety.   Risk to Self: UTA since patient was poorly cooperative with interview.  Risk to Others:   UTA since patient was poorly cooperative with  interview. Prior Inpatient Therapy:   UTA since patient was poorly cooperative with interview. Prior Outpatient Therapy:   UTA since patient was poorly cooperative with interview.  Past Medical History:  Past Medical History:  Diagnosis Date  . Aortic atherosclerosis (HCC)   . Dementia   . Diabetes mellitus without complication (HCC)   . History of angina   . Hypertension   . Renal disorder    CKD stage 3    Past Surgical History:  Procedure Laterality Date  . BELOW KNEE LEG AMPUTATION Bilateral    Family History: No family history on file. Family Psychiatric  History: Unknown  Social History:  Social History   Substance and Sexual Activity  Alcohol Use No     Social History   Substance and Sexual Activity  Drug Use No    Social History   Socioeconomic History  . Marital status: Widowed    Spouse name: None  . Number of children: None  . Years of education: None  . Highest education level: None  Social Needs  . Financial resource strain: None  . Food insecurity - worry: None  . Food insecurity - inability: None  . Transportation needs - medical: None  . Transportation needs - non-medical: None  Occupational History  . None  Tobacco Use  . Smoking status: Unknown If Ever Smoked  . Smokeless tobacco: Never Used  Substance and Sexual Activity  . Alcohol use: No  . Drug use: No  . Sexual activity: No  Other Topics Concern  . None  Social History Narrative  . None   Additional   Social History: UTA since patient was poorly cooperative with interview.    Allergies:  No Known Allergies  Labs:  Results for orders placed or performed during the hospital encounter of 09/05/17 (from the past 48 hour(s))  CBC with Differential     Status: Abnormal   Collection Time: 09/05/17 10:10 PM  Result Value Ref Range   WBC 7.3 4.0 - 10.5 K/uL   RBC 3.64 (L) 4.22 - 5.81 MIL/uL   Hemoglobin 9.4 (L) 13.0 - 17.0 g/dL   HCT 28.9 (L) 39.0 - 52.0 %   MCV 79.4 78.0 - 100.0  fL   MCH 25.8 (L) 26.0 - 34.0 pg   MCHC 32.5 30.0 - 36.0 g/dL   RDW 14.8 11.5 - 15.5 %   Platelets 404 (H) 150 - 400 K/uL   Neutrophils Relative % 64 %   Neutro Abs 4.7 1.7 - 7.7 K/uL   Lymphocytes Relative 20 %   Lymphs Abs 1.4 0.7 - 4.0 K/uL   Monocytes Relative 9 %   Monocytes Absolute 0.7 0.1 - 1.0 K/uL   Eosinophils Relative 6 %   Eosinophils Absolute 0.4 0.0 - 0.7 K/uL   Basophils Relative 1 %   Basophils Absolute 0.0 0.0 - 0.1 K/uL  Comprehensive metabolic panel     Status: Abnormal   Collection Time: 09/05/17 10:10 PM  Result Value Ref Range   Sodium 134 (L) 135 - 145 mmol/L   Potassium 3.3 (L) 3.5 - 5.1 mmol/L   Chloride 105 101 - 111 mmol/L   CO2 22 22 - 32 mmol/L   Glucose, Bld 167 (H) 65 - 99 mg/dL   BUN 14 6 - 20 mg/dL   Creatinine, Ser 1.46 (H) 0.61 - 1.24 mg/dL   Calcium 8.2 (L) 8.9 - 10.3 mg/dL   Total Protein 6.2 (L) 6.5 - 8.1 g/dL   Albumin 2.4 (L) 3.5 - 5.0 g/dL   AST 18 15 - 41 U/L   ALT 8 (L) 17 - 63 U/L   Alkaline Phosphatase 64 38 - 126 U/L   Total Bilirubin 0.4 0.3 - 1.2 mg/dL   GFR calc non Af Amer 45 (L) >60 mL/min   GFR calc Af Amer 52 (L) >60 mL/min    Comment: (NOTE) The eGFR has been calculated using the CKD EPI equation. This calculation has not been validated in all clinical situations. eGFR's persistently <60 mL/min signify possible Chronic Kidney Disease.    Anion gap 7 5 - 15  I-stat troponin, ED     Status: Abnormal   Collection Time: 09/05/17 10:19 PM  Result Value Ref Range   Troponin i, poc 0.13 (HH) 0.00 - 0.08 ng/mL   Comment NOTIFIED PHYSICIAN    Comment 3            Comment: Due to the release kinetics of cTnI, a negative result within the first hours of the onset of symptoms does not rule out myocardial infarction with certainty. If myocardial infarction is still suspected, repeat the test at appropriate intervals.   Troponin I     Status: Abnormal   Collection Time: 09/05/17 11:05 PM  Result Value Ref Range    Troponin I 0.12 (HH) <0.03 ng/mL    Comment: CRITICAL RESULT CALLED TO, READ BACK BY AND VERIFIED WITH: SANDERS,A RN 09/05/2017 2357 JORDANS     Current Facility-Administered Medications  Medication Dose Route Frequency Provider Last Rate Last Dose  . acetaminophen (TYLENOL) tablet 650 mg  650 mg Oral Q4H PRN Shanon Brow,  Rachal A, MD   650 mg at 09/06/17 2335  . amLODipine (NORVASC) tablet 10 mg  10 mg Oral Daily David, Rachal A, MD      . aspirin EC tablet 325 mg  325 mg Oral Daily David, Rachal A, MD   325 mg at 09/06/17 1245  . atorvastatin (LIPITOR) tablet 40 mg  40 mg Oral QHS David, Rachal A, MD      . carvedilol (COREG) tablet 3.125 mg  3.125 mg Oral BID WC Ogbata, Sylvester I, MD   3.125 mg at 09/06/17 1846  . enoxaparin (LOVENOX) injection 40 mg  40 mg Subcutaneous Daily Myers, Iskra M, MD      . feeding supplement (ENSURE ENLIVE) (ENSURE ENLIVE) liquid 237 mL  237 mL Oral BID BM Ogbata, Sylvester I, MD   237 mL at 09/07/17 1046  . finasteride (PROSCAR) tablet 5 mg  5 mg Oral Daily David, Rachal A, MD   5 mg at 09/06/17 1246  . furosemide (LASIX) tablet 60 mg  60 mg Oral Daily David, Rachal A, MD   60 mg at 09/06/17 1246  . insulin glargine (LANTUS) injection 6 Units  6 Units Subcutaneous QHS David, Rachal A, MD      . losartan (COZAAR) tablet 100 mg  100 mg Oral Daily Ogbata, Sylvester I, MD   100 mg at 09/06/17 1245  . nitroGLYCERIN (NITROSTAT) SL tablet 0.4 mg  0.4 mg Sublingual PRN David, Rachal A, MD      . ondansetron (ZOFRAN) injection 4 mg  4 mg Intravenous Q6H PRN David, Rachal A, MD      . polyethylene glycol (MIRALAX / GLYCOLAX) packet 17 g  17 g Oral Daily Weigle, Katherine R, RPH      . polyvinyl alcohol (LIQUIFILM TEARS) 1.4 % ophthalmic solution 1 drop  1 drop Both Eyes PRN Ogbata, Sylvester I, MD      . tamsulosin (FLOMAX) capsule 0.4 mg  0.4 mg Oral Daily David, Rachal A, MD   0.4 mg at 09/06/17 1245    Musculoskeletal: Strength & Muscle Tone: UTA since patient was  poorly cooperative with interview. Gait & Station: UTA since patient was poorly cooperative with interview. He has a right BKA. Patient leans: N/A  Psychiatric Specialty Exam: Physical Exam  Nursing note and vitals reviewed. Constitutional: He appears well-developed and well-nourished.  HENT:  Head: Normocephalic and atraumatic.  Neck: Normal range of motion.  Respiratory: Effort normal.  Musculoskeletal: Normal range of motion.  Neurological: He is alert.  Skin: No rash noted.    Review of Systems  Gastrointestinal: Positive for nausea.  Psychiatric/Behavioral: Negative for depression. The patient is not nervous/anxious.   Unable to fully assess since patient was poorly cooperative with interview.  Blood pressure (!) 185/78, pulse 79, temperature 98 F (36.7 C), temperature source Oral, resp. rate 20, height 6' 2" (1.88 m), weight 107.9 kg (237 lb 12.8 oz), SpO2 99 %.Body mass index is 30.53 kg/m.  General Appearance: Well Groomed, elderly, African American male with a right BKA, hospital gown and lying in bed. NAD.   Eye Contact:  Good  Speech:  Clear and Coherent  Volume:  Normal  Mood:  Bad  Affect:  Constricted  Thought Process:  Linear  Orientation:  Other:  UTA since patient was poorly cooperative with interview.  Thought Content:  Perseverates about feeling sick.  Suicidal Thoughts:  UTA since patient was poorly cooperative with interview.  Homicidal Thoughts:  UTA since patient was poorly cooperative   with interview.  Memory:  UTA since patient was poorly cooperative with interview.  Judgement:  Poor  Insight:  Lacking  Psychomotor Activity:  Normal  Concentration:  Concentration: Poor and Attention Span: Poor  Recall:  UTA since patient was poorly cooperative with interview.  Fund of Knowledge:  Poor  Language:  Poor  Akathisia:  No  Handed:  Right  AIMS (if indicated):   N/A  Assets:  Housing  ADL's:  Intact with assistance.  Cognition:  Impaired,  Severe   Sleep:   Appears okay as patient was sleeping when initially attempted to interview him.    Assessment: Shawn Pearson is a 76 y.o. male admitted with chest pain in the setting of uncontrolled blood pressure secondary to poor medication compliance. He was irritable with questioning but did not demonstrate agitated behavior that was perceived as threatening. He was poorly cooperative with interviewing although he reported feeling sick. Per record review, he has a history of dementia and likely has behavioral disturbance secondary to this condition. Unfortunately he has prolonged QTc (526) so antipsychotic medications are not recommended given risk for arrhythmias. Buspar has shown efficiency for agitation in the elderly population. He may benefit from a trial of this medication.   Treatment Plan Summary: -Recommend Buspar 7.5 mg BID for agitation. Antipsychotic use is not advised given prolonged QTc and risk for arrhthymias (QTc 526 on 11/17). -Depakote could be considered as an alternative option for agitation.  -Start Melatonin 5 mg qhs to regulate sleep/wake cycle. May have potential benefit in patients with dementia for behavioral disturbances related to circadian rhythm changes. -Consider soft restraints as needed for imminent risk of harming self or others and if needed for emergent treatment.  -Consider neurology consult for management of dementia.  -Psychiatry will follow patient as clinically needed.    Disposition: No evidence of imminent risk to self or others at present.   Patient does not meet criteria for psychiatric inpatient admission.   J , DO 09/07/2017 11:13 AM 

## 2017-09-07 NOTE — Progress Notes (Signed)
Patient allowed me to take his vital signs and give him his coreg that was scheduled. He is being calm at the moment.

## 2017-09-07 NOTE — Progress Notes (Signed)
Patient refused all morning medications except Ensure.  Nursing Student requested to take vital signs again and he refused.

## 2017-09-07 NOTE — Progress Notes (Addendum)
Patient ID: Shawn Pearson, male   DOB: 1940/11/17, 76 y.o.   MRN: [id number]    PROGRESS NOTE  Shawn Pearson  OHY:[id number] DOB: Jan 31, 1941 DOA: 09/05/2017  PCP: Mabeline Caras, NP   Brief Narrative:  Patient is 76 year old male with dementia, CK D, hypertension, medical noncompliance with medications, negative stress test in February 2018, admitted from skilled nursing facility with chest pain and uncontrolled blood pressure due to noncompliance with medications.  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  Objective: Vitals:   09/06/17 1007 09/06/17 2015 09/07/17 1227 09/07/17 1625  BP: (!) 150/58 (!) 185/78  (!) 169/61  Pulse: 80 79  86  Resp:      Temp:  98 F (36.7 C)  98.4 F (36.9 C)  TempSrc:  Oral Other (Comment) Oral  SpO2: 97% 99%  96%  Weight:        Height:        Intake/Output Summary (Last 24 hours) at 09/07/2017 1630 Last data filed at 09/07/2017 1315 Gross per 24 hour  Intake 837 ml  Output 1875 ml  Net -1038 ml   Filed Weights   09/05/17 2112 09/06/17 0205  Weight: 96.2 kg (212 lb) 107.9 kg (237 lb 12.8 oz)    Examination:  General exam: Appears calm, flat affect, NAD Respiratory system: Clear to auscultation. Respiratory effort normal. Cardiovascular system: S1 & S2 heard, RRR. No JVD, murmurs Gastrointestinal system: Abdomen is nondistended, soft and nontender. No organomegaly or masses felt. Normal bowel sounds heard. Central nervous system: bilateral BKA, moving all extremities equally  Data Reviewed: I have personally reviewed following labs and imaging studies  CBC: Recent Labs  Lab 08/31/17 2309 09/05/17 2210  WBC  --  7.3  NEUTROABS  --  4.7  HGB 8.8* 9.4*  HCT 26.8* 28.9*  MCV  --  79.4  PLT  --  948*   Basic Metabolic Panel: Recent Labs  Lab 09/05/17 2210  NA 134*  K 3.3*  CL 105  CO2 22  GLUCOSE 167*  BUN 14  CREATININE 1.46*  CALCIUM 8.2*   Liver Function Tests: Recent Labs  Lab 09/05/17 2210  AST 18  ALT 8*  ALKPHOS 64  BILITOT 0.4  PROT 6.2*  ALBUMIN 2.4*   Cardiac Enzymes:  Recent Labs  Lab 08/31/17 1923 09/05/17 2305  TROPONINI 0.45* 0.12*   CBG: Recent Labs  Lab 08/31/17 1707 08/31/17 2129 09/01/17 0819  GLUCAP 177* 164* 126*   Recent Results (from the past 240 hour(s))  MRSA PCR Screening     Status: None   Collection Time: 08/30/17  7:56 PM  Result Value Ref Range Status   MRSA by PCR NEGATIVE NEGATIVE Final    Comment:        The GeneXpert MRSA Assay (FDA approved for NASAL specimens only), is one component of a comprehensive MRSA colonization surveillance program. It is not intended to diagnose MRSA infection nor to guide or monitor treatment for MRSA infections.       Radiology Studies: Dg Chest 2 View  Result Date: 09/05/2017 CLINICAL  DATA:  Left-sided chest pain with headache onset 2 days ago. EXAM: CHEST  2 VIEW COMPARISON:  08/30/2017 FINDINGS: Cardiomegaly with moderate aortic atherosclerosis. The patient is status post median sternotomy. Left-sided pacemaker apparatus projects over the left lung base. Right atrial pacer and right ventricular leads are noted. A coronary sinus lead is also present. There is minimal atelectasis at the lung bases. There is mild central vascular congestion similar to prior. No significant pleural fluid collections or pneumothorax. No acute osseous appearing abnormality. IMPRESSION: Stable cardiomegaly with aortic atherosclerosis. Mild central vascular congestion similar prior. Electronically Signed   By: Ashley Royalty M.D.   On: 09/05/2017 23:21   Ct Head Wo Contrast  Result Date: 09/05/2017 CLINICAL DATA:  76 year old now with headache. EXAM: CT HEAD WITHOUT CONTRAST TECHNIQUE: Contiguous axial images were obtained from the base of the skull through the vertex without intravenous contrast. COMPARISON:  None. FINDINGS: Brain: There is mild age-related atrophy and chronic microvascular ischemic changes. Areas of old infarct and encephalomalacia noted in the region of the basal ganglia bilaterally. There is no acute intracranial hemorrhage. No mass effect or midline shift noted. No extra-axial fluid collection. Vascular: No hyperdense vessel or unexpected calcification. Skull: Normal. Negative for fracture or focal lesion. Sinuses/Orbits: There is diffuse mucoperiosteal thickening of paranasal sinuses. There is partial opacification of multiple left mastoid air cells. The right mastoid air cells are clear. No air-fluid levels. Bilateral cataract surgeries. Other: There is diffuse subcutaneous edema of the scalp. There is a 2.0 x 0.9 cm focal thickening/scarring, a small hematoma, or lesion in the right parietal scalp. Correlation with clinical exam recommended. IMPRESSION: 1. No acute intracranial hemorrhage. 2.  Age-related atrophy chronic microvascular ischemic changes. 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 If symptoms persist, and there are no contraindications, MRI may provide better evaluation if clinically indicated. 3. Focal skin thickening or scarring of the right parietal scalp. Correlation with clinical exam recommended. Electronically Signed   By: Anner Crete M.D.   On: 09/05/2017 23:34   Scheduled Meds: . amLODipine  10 mg Oral Daily  . aspirin EC  325 mg Oral Daily  . atorvastatin  40 mg Oral QHS  . carvedilol  3.125 mg Oral BID WC  . enoxaparin (LOVENOX) injection  40 mg Subcutaneous Daily  . feeding supplement (ENSURE ENLIVE)  237 mL Oral TID BM  . finasteride  5 mg Oral Daily  . furosemide  60 mg Oral Daily  . insulin glargine  6 Units Subcutaneous QHS  . losartan  100 mg Oral Daily  . polyethylene glycol  17 g Oral Daily  . tamsulosin  0.4 mg Oral Daily   Continuous Infusions:   LOS: 1 day  Time spent: 25 minutes   Faye Ramsay, MD [facility] Pager [phone]  If 7PM-7AM, please contact night-coverage [email or web address] Password [email or web address] 09/07/2017, 4:30 PM

## 2017-09-08 ENCOUNTER — Encounter (HOSPITAL_COMMUNITY): Payer: Self-pay | Admitting: *Deleted

## 2017-09-08 DIAGNOSIS — I10 Essential (primary) hypertension: Secondary | ICD-10-CM

## 2017-09-08 LAB — GLUCOSE, CAPILLARY: GLUCOSE-CAPILLARY: 179 mg/dL — AB (ref 65–99)

## 2017-09-08 MED ORDER — CARVEDILOL 12.5 MG PO TABS
12.5000 mg | ORAL_TABLET | Freq: Two times a day (BID) | ORAL | Status: DC
  Administered 2017-09-08 – 2017-10-22 (×70): 12.5 mg via ORAL
  Filled 2017-09-08 (×78): qty 1

## 2017-09-08 MED ORDER — HYDROCODONE-ACETAMINOPHEN 5-325 MG PO TABS
1.0000 | ORAL_TABLET | Freq: Once | ORAL | Status: AC
  Administered 2017-09-08: 1 via ORAL
  Filled 2017-09-08: qty 1

## 2017-09-08 NOTE — Progress Notes (Addendum)
Patient is throwing remote at staff.  Refusing for [place] to check his blood sugar. Notified Doctor

## 2017-09-08 NOTE — Progress Notes (Signed)
Hydrocodone ordered for By MD after pt. Refused Tramadol. Pt. Now refusing Hydrocodone. Pt. Continues to use call light to call out for pain medication. Pt. In stable condition. RN will continue to monitor.

## 2017-09-08 NOTE — Progress Notes (Signed)
OT Cancellation Note  Patient Details Name: Shawn Pearson MRN: [id number] DOB: 01-Apr-1941   Cancelled Treatment:    Reason Eval/Treat Not Completed: Patient not medically ready. Pt with elevated troponin and current bedrest orders. Will await troponin level trending down as well as increase in activity orders prior to initiation of evaluation.   Norman Herrlich, MS OTR/L  Pager: [phone]   Norman Herrlich 09/08/2017, 1:59 PM

## 2017-09-08 NOTE — Progress Notes (Signed)
Patient allowed me to give morning meds and take VS. However is refusing to let us take his blood sugar. Will notify Dr.

## 2017-09-08 NOTE — Progress Notes (Signed)
Pt. Requesting pain med. Pain medication offered and administered by RN to pt.

## 2017-09-08 NOTE — Progress Notes (Signed)
PT Cancellation Note  Patient Details Name: Shawn Pearson MRN: [id number] DOB: 16-Sep-1941   Cancelled Treatment:    Reason Eval/Treat Not Completed: Medical issues which prohibited therapy.  Pt is on bed rest and has elevated troponin, will check later as time and pt allow.   Ramond Dial 09/08/2017, 9:28 AM   Mee Hives, PT MS Acute Rehab Dept. Number: [place] [facility] [place]
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
## 2017-09-08 NOTE — Progress Notes (Signed)
Patient ID: Shawn Pearson, male   DOB: 1941/01/10, 76 y.o.   MRN: [id number]    PROGRESS NOTE  Shawn Pearson  LDJ:[id number] DOB: 1941-05-18 DOA: 09/05/2017  PCP: Mabeline Caras, NP   Brief Narrative:  Patient is 76 year old male with dementia, CK D, hypertension, medical noncompliance with medications, negative stress test in February 2018, admitted from skilled nursing facility with chest pain and uncontrolled blood pressure due to noncompliance with medications.  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   Objective: Vitals:   09/07/17 1625 09/07/17 2137 09/08/17 0409 09/08/17 0856  BP: (!) 169/61 (!) 162/86 (!) 144/59 (!) 174/68  Pulse: 86 87 84 76  Resp:  18 18   Temp: 98.4 F (36.9 C) 98.8 F (37.1 C) 98.4 F (36.9 C) 98.4 F  (36.9 C)  TempSrc: Oral Oral Oral Oral  SpO2: 96% 100% 99% 99%  Weight:   93.5 kg (206 lb 2.1 oz)   Height:        Intake/Output Summary (Last 24 hours) at 09/08/2017 1354 Last data filed at 09/08/2017 1204 Gross per 24 hour  Intake 480 ml  Output 1000 ml  Net -520 ml   Filed Weights   09/05/17 2112 09/06/17 0205 09/08/17 0409  Weight: 96.2 kg (212 lb) 107.9 kg (237 lb 12.8 oz) 93.5 kg (206 lb 2.1 oz)    Examination:  Pt refused exam this AM  Data Reviewed: I have personally reviewed following labs and imaging studies  CBC: Recent Labs  Lab 09/05/17 2210  WBC 7.3  NEUTROABS 4.7  HGB 9.4*  HCT 28.9*  MCV 79.4  PLT 030*   Basic Metabolic Panel: Recent Labs  Lab 09/05/17 2210  NA 134*  K 3.3*  CL 105  CO2 22  GLUCOSE 167*  BUN 14  CREATININE 1.46*  CALCIUM 8.2*   Liver Function Tests: Recent Labs  Lab 09/05/17 2210  AST 18  ALT 8*  ALKPHOS 64  BILITOT 0.4  PROT 6.2*  ALBUMIN 2.4*   Cardiac Enzymes: Recent Labs  Lab 09/05/17 2305  TROPONINI 0.12*   CBG: Recent Labs  Lab 09/08/17 1218  GLUCAP 179*   Recent Results (from the past 240 hour(s))  MRSA PCR Screening     Status:  None   Collection Time: 08/30/17  7:56 PM  Result Value Ref Range Status   MRSA by PCR NEGATIVE NEGATIVE Final    Comment:        The GeneXpert MRSA Assay (FDA approved for NASAL specimens only), is one component of a comprehensive MRSA colonization surveillance program. It is not intended to diagnose MRSA infection nor to guide or monitor treatment for MRSA infections.       Radiology Studies: No results found. Scheduled Meds: . amLODipine  10 mg Oral Daily  . aspirin EC  325 mg Oral Daily  . atorvastatin  40 mg Oral QHS  . carvedilol  3.125 mg Oral BID WC  . enoxaparin (LOVENOX) injection  40 mg Subcutaneous Daily  . feeding supplement (ENSURE ENLIVE)  237 mL Oral TID BM  . finasteride  5 mg Oral Daily  . furosemide  60 mg Oral Daily  . insulin  aspart  0-9 Units Subcutaneous TID WC  . insulin glargine  6 Units Subcutaneous QHS  . losartan  100 mg Oral Daily  . polyethylene glycol  17 g Oral Daily  . tamsulosin  0.4 mg Oral Daily  . traMADol  50 mg Oral Once   Continuous Infusions:   LOS: 2 days   Time spent: 15 minutes   Faye Ramsay, MD [facility] Pager [phone]  If 7PM-7AM, please contact night-coverage [email or web address] Password [email or web address] 09/08/2017, 1:54 PM
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
## 2017-09-08 NOTE — Progress Notes (Signed)
Pt. Continues to call out for pain medication. Pain medication offered and pt. Continues to refuse pain medication offered.

## 2017-09-08 NOTE — Progress Notes (Signed)
Pt. Refusing all scheduled meds. Also refused to have CBG taking by NT. Pt. Stated he was in pain all over. Will not answer questions as to pain assessment.  On call NP, K.Kirby, for [facility] made aware.

## 2017-09-09 DIAGNOSIS — R072 Precordial pain: Secondary | ICD-10-CM

## 2017-09-09 DIAGNOSIS — R778 Other specified abnormalities of plasma proteins: Secondary | ICD-10-CM

## 2017-09-09 DIAGNOSIS — R7989 Other specified abnormal findings of blood chemistry: Secondary | ICD-10-CM

## 2017-09-09 DIAGNOSIS — R748 Abnormal levels of other serum enzymes: Secondary | ICD-10-CM

## 2017-09-09 MED ORDER — ISOSORBIDE MONONITRATE ER 30 MG PO TB24
30.0000 mg | ORAL_TABLET | Freq: Every day | ORAL | Status: DC
  Administered 2017-09-13 – 2017-10-20 (×31): 30 mg via ORAL
  Filled 2017-09-09 (×38): qty 1

## 2017-09-09 MED ORDER — BUSPIRONE HCL 15 MG PO TABS
7.5000 mg | ORAL_TABLET | Freq: Two times a day (BID) | ORAL | Status: DC
  Administered 2017-09-09: 7.5 mg via ORAL
  Filled 2017-09-09 (×3): qty 1
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
## 2017-09-09 NOTE — Consult Note (Addendum)
[facility] Psych Consult Progress Note  09/09/2017 3:10 PM Shawn Pearson  MRN:  [id number] Subjective:   Per nursing, patient has been agitated and refusing care including medications and blood draws. He would not participate in interview today. He reported that he was wet to his nurse and asked her to clean him up. He became inpatient from waiting and yelled at her. He has been sleeping this morning. It is unclear if he slept last night. He did not eat lunch but he has been drinking ensure.   Principal Problem: Dementia Diagnosis:   Patient Active Problem List   Diagnosis Date Noted  . Elevated troponin [R74.8]   . Agitation [R45.1]   . Dementia [F03.90] 09/06/2017  . Noncompliance with medication regimen [Z91.14] 09/06/2017  . Anemia [D64.9] 08/30/2017  . Hypertension [I10] 08/30/2017  . Hypertensive urgency [I16.0] 08/21/2017  . DM2 (diabetes mellitus, type 2) ([place]) [E11.9] 08/21/2017  . CKD stage 3 secondary to diabetes ([place]) [W43.15, N18.3] 08/21/2017  . 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 Hypertensive emergency [I16.1] 08/21/2017  . Chest pain [R07.9]    Total Time spent with patient: 15 minutes  Past Psychiatric History: Patient denies a history of depression or anxiety.   Past Medical History:  Past Medical History:  Diagnosis Date  . Aortic atherosclerosis ([place])   . Dementia   . Diabetes mellitus without complication ([place])   . History of angina   . Hypertension   . Renal disorder    CKD stage 3    Past Surgical History:  Procedure Laterality Date  . BELOW KNEE LEG AMPUTATION Bilateral    Family History: History reviewed. No pertinent family history. Family Psychiatric  History: Unknown  Social History:  Social History   Substance and Sexual Activity  Alcohol Use No     Social History   Substance and Sexual Activity  Drug Use No    Social History   Socioeconomic History  . Marital status: Widowed    Spouse name: None  . Number of  children: None  . Years of education: None  . Highest education level: None  Social Needs  . Financial resource strain: None  . Food insecurity - worry: None  . Food insecurity - inability: None  . Transportation needs - medical: None  . Transportation needs - non-medical: None  Occupational History  . None  Tobacco Use  . Smoking status: Unknown If Ever Smoked  . Smokeless tobacco: Never Used  Substance and Sexual Activity  . Alcohol use: No  . Drug use: No  . Sexual activity: No  Other Topics Concern  . None  Social History Narrative  . None    Sleep: Fair  Appetite:  Poor  Current Medications: Current Facility-Administered Medications  Medication Dose Route Frequency Provider Last Rate Last Dose  . acetaminophen (TYLENOL) tablet 650 mg  650 mg Oral Q4H PRN Phillips Grout, MD   650 mg at 09/06/17 2335  . amLODipine (NORVASC) tablet 10 mg  10 mg Oral Daily Derrill Kay A, MD   10 mg at 09/08/17 0859  . aspirin EC tablet 325 mg  325 mg Oral Daily Derrill Kay A, MD   325 mg at 09/08/17 0859  . atorvastatin (LIPITOR) tablet 40 mg  40 mg Oral QHS Derrill Kay A, MD   40 mg at 09/08/17 2206  . busPIRone (BUSPAR) tablet 7.5 mg  7.5 mg Oral BID Regalado, Belkys A, MD      . carvedilol (  COREG) tablet 12.5 mg  12.5 mg Oral BID WC Alphonzo Grieve, MD   12.5 mg at 09/08/17 1658  . enoxaparin (LOVENOX) injection 40 mg  40 mg Subcutaneous Daily Theodis Blaze, MD   40 mg at 09/08/17 0859  . feeding supplement (ENSURE ENLIVE) (ENSURE ENLIVE) liquid 237 mL  237 mL Oral TID BM Theodis Blaze, MD   237 mL at 09/09/17 1216  . finasteride (PROSCAR) tablet 5 mg  5 mg Oral Daily Derrill Kay A, MD   5 mg at 09/08/17 0858  . insulin aspart (novoLOG) injection 0-9 Units  0-9 Units Subcutaneous TID WC Theodis Blaze, MD   2 Units at 09/08/17 1137  . insulin glargine (LANTUS) injection 6 Units  6 Units Subcutaneous QHS Derrill Kay A, MD      . isosorbide mononitrate (IMDUR) 24 hr tablet 30  mg  30 mg Oral Daily Regalado, Belkys A, MD      . losartan (COZAAR) tablet 100 mg  100 mg Oral Daily Dana Allan I, MD   100 mg at 09/08/17 0859  . nitroGLYCERIN (NITROSTAT) SL tablet 0.4 mg  0.4 mg Sublingual PRN Phillips Grout, MD      . ondansetron [facility]) injection 4 mg  4 mg Intravenous Q6H PRN Phillips Grout, MD   4 mg at 09/07/17 1459  . polyethylene glycol (MIRALAX / GLYCOLAX) packet 17 g  17 g Oral Daily Janett Billow, RPH   17 g at 09/08/17 1517  . polyvinyl alcohol (LIQUIFILM TEARS) 1.4 % ophthalmic solution 1 drop  1 drop Both Eyes PRN Dana Allan I, MD      . tamsulosin (FLOMAX) capsule 0.4 mg  0.4 mg Oral Daily Derrill Kay A, MD   0.4 mg at 09/08/17 0859  . traMADol (ULTRAM) tablet 50 mg  50 mg Oral Once Vertis Kelch, NP        Lab Results:  Results for orders placed or performed during the hospital encounter of 09/05/17 (from the past 48 hour(s))  Glucose, capillary     Status: Abnormal   Collection Time: 09/08/17 12:18 PM  Result Value Ref Range   Glucose-Capillary 179 (H) 65 - 99 mg/dL    Musculoskeletal: Strength & Muscle Tone: UTA since patient was uncooperative with interview. Gait & Station: UTA since patient was uncooperative with interview. He has bilateral BKA. Patient leans: N/A  Psychiatric Specialty Exam: Physical Exam  Nursing note and vitals reviewed. Constitutional: He appears well-developed and well-nourished.  HENT:  Head: Normocephalic and atraumatic.  Neck: Normal range of motion.  Respiratory: Effort normal.  Musculoskeletal: Normal range of motion.  Neurological: He is alert.  Oriented to self.   Skin: No rash noted.  Psychiatric: His speech is normal. His affect is labile. He is agitated. Cognition and memory are impaired. He expresses impulsivity.    ROS UTA since patient was uncooperative with interview.   Blood pressure (!) 171/59, pulse 82, temperature 99 F (37.2 C), temperature source Oral, resp.  rate 18, height 6\' 2"  (1.88 m), weight 90.6 kg (199 lb 11.8 oz), SpO2 99 %.Body mass index is 25.64 kg/m.  General Appearance: Well Groomed  Eye Contact:  Poor  Speech:  Clear and Coherent  Volume:  Increased  Mood:  Angry  Affect:  Labile  Thought Process:  Linear  Orientation:  Other:  Oriented to self.  Thought Content:  UTA since patient was uncooperative with interview. He only spoke to the nurse to inform  her of urinary incontinence.  Suicidal Thoughts:  UTA since patient was uncooperative with interview.  Homicidal Thoughts:  UTA since patient was uncooperative with interview.  Memory:  UTA since patient was uncooperative with interview.  Judgement:  Poor  Insight:  Lacking  Psychomotor Activity:  Decreased  Concentration:  Concentration: UTA since patient was uncooperative with interview. and Attention Span: UTA since patient was uncooperative with interview.  Recall:  UTA since patient was uncooperative with interview.  Fund of Knowledge:  UTA since patient was uncooperative with interview.  Language:  UTA since patient was uncooperative with interview.  Akathisia:  UTA since patient was uncooperative with interview.  Handed:  Right  AIMS (if indicated):   N/A  Assets:  Housing  ADL's:  Intact  Cognition:  UTA since patient was uncooperative with interview but history of dementia.   Sleep:   Fair    Assessment:  Shawn Pearson is a 76 y.o. male who was admitted with chest pain in the setting of uncontrolled blood pressure secondary to poor medication compliance. He continues to be agitated and refuses care including medications and blood draws. He has reportedly not been sleeping or eating well. Per record review, he has a history of dementia and likely has behavioral disturbance secondary to this condition. Unfortunately he has prolonged QTc (526) so antipsychotic medications are not recommended given risk for arrhythmias. Since patient is refusing Buspar, it would be  reasonable to try Depakote for agitation in IV formulation or sprinkles which can be placed in his food/drink.   Treatment Plan Summary: -Recommend discontinuing Buspar since patient has been refusing medication.  -Recommend Depakote 125 mg qhs for agitation. Can be titrated for symptom management. Can increase to twice a day if it does not cause oversedation. Can be given in IV or sprinkle formulation in food/drink.  -Antipsychotic use is not advised given prolonged QTc and risk for arrhthymias (QTc 526 on 11/17). -Consider soft restraints as needed for imminent risk of harming self or others and if needed for emergent treatment.  -Consider neurology consult for management of dementia.  -Psychiatry will follow patient as clinically needed.      Faythe Dingwall, DO 09/09/2017, 3:10 PM

## 2017-09-09 NOTE — Progress Notes (Addendum)
PROGRESS NOTE    Shawn Pearson  BWG:[id number] DOB: [birth year] DOA: 09/05/2017 PCP: Shawn Caras, NP    Brief Narrative: Brief Narrative:  Patient is 76 year old male with dementia, CK D, hypertension, medical noncompliance with medications, negative stress test in February 2018, admitted from skilled nursing facility with chest pain and uncontrolled blood pressure due to noncompliance with medications.      Assessment & Plan:   Principal Problem:   Agitation Active Problems:   Hypertensive urgency   CKD stage 3 secondary to diabetes ([place])   Chest pain   Dementia   Noncompliance with medication regimen  1-Chest pain ; Refuse to answer questions. He is drinking coffee.  Start Imdur.  Continue to controlled BP.  Cardiology recommend BP control, and no further invasive test.   2-HTN Urgency;  Continue with Norvasc, coreg, lasix, cozaar.  Add Imdur.  I will hold lasix, because he is refusing labs, and we are not able to follow on K level and renal function.   3-Dementia, suicidal , homicidal ideation.  Reported homicidal ideation to cardiologist on 11-20.[facility]  consulted again.  Safety sitter at bedside.  Started Buspar as recommend by Psych.   4-DM type 2; On lantus and SSI>   5-CKD stage III; Labs ordered again. He continue to refuse.   6-Noncompliance with medication regimen   7-Anemia; Iron deficiency.  Occult blood negative.  Refuse labs .   8-Invasive  rectal cancer.   DVT prophylaxis: Lovenox Code Status: Full code.  Family Communication: none at bedside.  Disposition Plan: await psych re-evaluation.    Consultants:   Cardiology  Psych   Procedures:  ECHO; Left ventricle: The cavity size was normal. There was moderate   concentric hypertrophy. Systolic function was mildly to   moderately reduced. The estimated ejection fraction was in the   range of 40% to 45%. Hypokineis in the inferoseptal and inferior   myocardium. Features are  consistent with a pseudonormal left   ventricular filling pattern, with concomitant abnormal relaxation   and increased filling pressure (grade 2 diastolic dysfunction).   Doppler parameters are consistent with high ventricular filling   pressure. - Aortic valve: Transvalvular velocity was within the normal range.   There was no stenosis. There was no regurgitation. - Mitral valve: Transvalvular velocity was within the normal range.   There was no evidence for stenosis. There was mild regurgitation. - Left atrium: The atrium was moderately dilated. - Right ventricle: The cavity size was normal. Wall thickness was   normal. Systolic function was normal. - Right atrium: The atrium was moderately dilated. - Atrial septum: No defect or patent foramen ovale was identified   by color flow Doppler. - Tricuspid valve: There was mild regurgitation. - Pulmonary arteries: Systolic pressure was within the normal   range. PA peak pressure: 29 mm Hg (S).    Antimicrobials:  none   Subjective: He is eating breakfast. He does not wants to answer questions.   Objective: Vitals:   09/08/17 0856 09/08/17 1656 09/08/17 2216 09/09/17 0647  BP: (!) 174/68 (!) 178/81 (!) 175/90 (!) 171/59  Pulse: 76 82 80 82  Resp:   20 18  Temp: 98.4 F (36.9 C)  98.4 F (36.9 C) 99 F (37.2 C)  TempSrc: Oral  Oral Oral  SpO2: 99%  98% 99%  Weight:    90.6 kg (199 lb 11.8 oz)  Height:        Intake/Output Summary (Last 24 hours) at 09/09/2017 1779 Last  data filed at 09/09/2017 0708 Gross per 24 hour  Intake 720 ml  Output 1750 ml  Net -1030 ml   Filed Weights   09/06/17 0205 09/08/17 0409 09/09/17 0647  Weight: 107.9 kg (237 lb 12.8 oz) 93.5 kg (206 lb 2.1 oz) 90.6 kg (199 lb 11.8 oz)    Examination:  Decline physical exam.     Data Reviewed: I have personally reviewed following labs and imaging studies  CBC: Recent Labs  Lab 09/05/17 2210  WBC 7.3  NEUTROABS 4.7  HGB 9.4*  HCT 28.9*   MCV 79.4  PLT 540*   Basic Metabolic Panel: Recent Labs  Lab 09/05/17 2210  NA 134*  K 3.3*  CL 105  CO2 22  GLUCOSE 167*  BUN 14  CREATININE 1.46*  CALCIUM 8.2*   GFR: Estimated Creatinine Clearance: 49.3 mL/min (A) (by C-G formula based on SCr of 1.46 mg/dL (H)). Liver Function Tests: Recent Labs  Lab 09/05/17 2210  AST 18  ALT 8*  ALKPHOS 64  BILITOT 0.4  PROT 6.2*  ALBUMIN 2.4*   No results for input(s): LIPASE, AMYLASE in the last 168 hours. No results for input(s): AMMONIA in the last 168 hours. Coagulation Profile: No results for input(s): INR, PROTIME in the last 168 hours. Cardiac Enzymes: Recent Labs  Lab 09/05/17 2305  TROPONINI 0.12*   BNP (last 3 results) No results for input(s): PROBNP in the last 8760 hours. HbA1C: No results for input(s): HGBA1C in the last 72 hours. CBG: Recent Labs  Lab 09/08/17 1218  GLUCAP 179*   Lipid Profile: No results for input(s): CHOL, HDL, LDLCALC, TRIG, CHOLHDL, LDLDIRECT in the last 72 hours. Thyroid Function Tests: No results for input(s): TSH, T4TOTAL, FREET4, T3FREE, THYROIDAB in the last 72 hours. Anemia Panel: No results for input(s): VITAMINB12, FOLATE, FERRITIN, TIBC, IRON, RETICCTPCT in the last 72 hours. Sepsis Labs: No results for input(s): PROCALCITON, LATICACIDVEN in the last 168 hours.  Recent Results (from the past 240 hour(s))  MRSA PCR Screening     Status: None   Collection Time: 08/30/17  7:56 PM  Result Value Ref Range Status   MRSA by PCR NEGATIVE NEGATIVE Final    Comment:        The GeneXpert MRSA Assay (FDA approved for NASAL specimens only), is one component of a comprehensive MRSA colonization surveillance program. It is not intended to diagnose MRSA infection nor to guide or monitor treatment for MRSA infections.          Radiology Studies: No results found.      Scheduled Meds: . amLODipine  10 mg Oral Daily  . aspirin EC  325 mg Oral Daily  .  atorvastatin  40 mg Oral QHS  . carvedilol  12.5 mg Oral BID WC  . enoxaparin (LOVENOX) injection  40 mg Subcutaneous Daily  . feeding supplement (ENSURE ENLIVE)  237 mL Oral TID BM  . finasteride  5 mg Oral Daily  . furosemide  60 mg Oral Daily  . insulin aspart  0-9 Units Subcutaneous TID WC  . insulin glargine  6 Units Subcutaneous QHS  . isosorbide mononitrate  30 mg Oral Daily  . losartan  100 mg Oral Daily  . polyethylene glycol  17 g Oral Daily  . tamsulosin  0.4 mg Oral Daily  . traMADol  50 mg Oral Once   Continuous Infusions:   LOS: 3 days    Time spent: 35 minutes.     Elmarie Shiley, MD  [facility] Pager [phone]  If 7PM-7AM, please contact night-coverage [email or web address] Password [email or web address] 09/09/2017, 7:23 AM

## 2017-09-09 NOTE — Progress Notes (Signed)
Went into pt room introduced Hospital doctor at bedside. Pt would not look at me or speak to me at all. Asked pt if he needed anything he did not respond and kept looking at the tv.

## 2017-09-09 NOTE — Progress Notes (Signed)
PT Cancellation Note  Patient Details Name: Shawn Pearson MRN: [id number] DOB: Jan 09, 1941   Cancelled Treatment:    Reason Eval/Treat Not Completed: Patient declined, no reason specified. Pt shook his head no when I introduced myself and asked if I could work with him. Ignored further interaction.   [place] 09/09/2017, 11:19 AM  Suanne Marker PT [phone]

## 2017-09-09 NOTE — Progress Notes (Signed)
Progress Note  Patient Name: Shawn Pearson Date of Encounter: 09/09/2017  Primary Cardiologist: Dr. Burt Knack [facility]  Subjective   Patient watching TV on entering room and only states he's still having chest pain and headache. He states no-one has come by with his medicines yet today then closed his eyes and did not answer further questions.   Per chart review, patient has declined labs and meds today.  Inpatient Medications    Scheduled Meds: . amLODipine  10 mg Oral Daily  . aspirin EC  325 mg Oral Daily  . atorvastatin  40 mg Oral QHS  . busPIRone  7.5 mg Oral BID  . carvedilol  12.5 mg Oral BID WC  . enoxaparin (LOVENOX) injection  40 mg Subcutaneous Daily  . feeding supplement (ENSURE ENLIVE)  237 mL Oral TID BM  . finasteride  5 mg Oral Daily  . insulin aspart  0-9 Units Subcutaneous TID WC  . insulin glargine  6 Units Subcutaneous QHS  . isosorbide mononitrate  30 mg Oral Daily  . losartan  100 mg Oral Daily  . polyethylene glycol  17 g Oral Daily  . tamsulosin  0.4 mg Oral Daily  . traMADol  50 mg Oral Once   Continuous Infusions:  PRN Meds: acetaminophen, nitroGLYCERIN, ondansetron (ZOFRAN) IV, polyvinyl alcohol   Vital Signs    Vitals:   09/08/17 0856 09/08/17 1656 09/08/17 2216 09/09/17 0647  BP: (!) 174/68 (!) 178/81 (!) 175/90 (!) 171/59  Pulse: 76 82 80 82  Resp:   20 18  Temp: 98.4 F (36.9 C)  98.4 F (36.9 C) 99 F (37.2 C)  TempSrc: Oral  Oral Oral  SpO2: 99%  98% 99%  Weight:    199 lb 11.8 oz (90.6 kg)  Height:        Intake/Output Summary (Last 24 hours) at 09/09/2017 0937 Last data filed at 09/09/2017 0708 Gross per 24 hour  Intake 480 ml  Output 1750 ml  Net -1270 ml   Filed Weights   09/06/17 0205 09/08/17 0409 09/09/17 0647  Weight: 237 lb 12.8 oz (107.9 kg) 206 lb 2.1 oz (93.5 kg) 199 lb 11.8 oz (90.6 kg)    Telemetry    n/a - Personally Reviewed  ECG   N/a today  Physical Exam   GEN: No acute distress. Appears to  be sitting uncomfortably but declines repositioning Neck: No JVD Cardiac: RRR, no murmurs, rubs, or gallops.  Respiratory: Clear to auscultation bilaterally on anterior fields GI: Soft, nontender, non-distended, +BS  MS: s/p bil BKAs Neuro:  Moves all 4 extremities freely Psych: flat affect   Labs    Chemistry Recent Labs  Lab 09/05/17 2210  NA 134*  K 3.3*  CL 105  CO2 22  GLUCOSE 167*  BUN 14  CREATININE 1.46*  CALCIUM 8.2*  PROT 6.2*  ALBUMIN 2.4*  AST 18  ALT 8*  ALKPHOS 64  BILITOT 0.4  GFRNONAA 45*  GFRAA 52*  ANIONGAP 7     Hematology Recent Labs  Lab 09/05/17 2210  WBC 7.3  RBC 3.64*  HGB 9.4*  HCT 28.9*  MCV 79.4  MCH 25.8*  MCHC 32.5  RDW 14.8  PLT 404*    Cardiac Enzymes Recent Labs  Lab 09/05/17 2305  TROPONINI 0.12*    Recent Labs  Lab 09/05/17 2219  TROPIPOC 0.13*     BNPNo results for input(s): BNP, PROBNP in the last 168 hours.   DDimer No results for input(s): DDIMER  in the last 168 hours.   Radiology    No results found.  Cardiac Studies   EKG 09/05/17, personally reviewed - atrial sensed - ventricular paced rhythm  Echo 09/01/17: - Left ventricle: The cavity size was normal. There was moderate concentric hypertrophy. Systolic function was mildly to moderately reduced. The estimated ejection fraction was in the range of 40% to 45%. Hypokinesis in the inferoseptal and inferior myocardium. Features are consistent with a pseudonormal left ventricular filling pattern, with concomitant abnormal relaxation and increased filling pressure (grade 2 diastolic dysfunction). Doppler parameters are consistent with high ventricular filling pressure. - Aortic valve: Transvalvular velocity was within the normal range. There was no stenosis. There was no regurgitation. - Mitral valve: Transvalvular velocity was within the normal range. There was no evidence for stenosis. There was mild regurgitation. - Left  atrium: The atrium was moderately dilated. - Right ventricle: The cavity size was normal. Wall thickness was normal. Systolic function was normal. - Right atrium: The atrium was moderately dilated. - Atrial septum: No defect or patent foramen ovale was identified by color flow Doppler. - Tricuspid valve: There was mild regurgitation. - Pulmonary arteries: Systolic pressure was within the normal range. PA peak pressure: 29 mm Hg (S).  LHC 06/17/16: Cancelled due to inability to cannulate RFA  Patient Profile     Shawn Ginyardis a 76 y.o.malewho is being seen today for the evaluation of chest painat the request of Dr. Doyle Askew. He has a significant h/o ischemic cardiomyopathy s/p ICD, severe PVD s/p bil LE amputations, complete HB s/p pace maker, HTN, T2DM, and anemia 2/2 GI bleeding. Patient was admitted on 11/18 from SNF with hypertensive urgency 2/2 medication nonadherence; admission has been complicated by patient declining meds and labs.  Assessment & Plan    Chest pain, troponemia: Patient admitted with hypertensive urgency due to declining meds at his SNF and was found to have mild troponin elevation to 0.12 on 11/17; unfortunately patient has refused further labs. EKG on admission showed paced rhythm with occasional PVC. Echo from last admission a week ago did show new mention of inferoseptal and inferior myocardium hypokinesis which was not noted on Echo 3/18 from [facility] though that exam was a technically difficult study.  --atorvastatin 40mg  daily, asa 325mg  daily, coreg 12.5mg  BID (titrate up as able when patient taking) --imdur added --at this time would not pursue invasive procedures  HTN: He was on a clonidine patch but this seems to have been taken off his home med list on this admission. --amlodipine 10mg  daily, carvedilol 12.5mg  daily (can work on titrating up once patient starts taking), losartan 100mg  daily. --added imdur  Chronic combined CHF,  ischemic cardiomyopathy s/p ICD: Echo at last admission showed mildly decreased EF from prior to 40-45% and G2DD.  --can increase coreg to 25mg  BID, continue losartan 100mg  daily, atorvastatin 40mg  daily, asa --his home lasix is being held but patient not taking any meds this AM.  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  T2DM: Declining insulin and CBG checks.  Homicidal ideation: Toward SNF staff. Already evaluated by psych for dementia with behavioral disturbances; to be re-consulted.  For questions or updates, please contact [place] Please consult [email or web address] for contact info under Cardiology/STEMI.   Signed, Alphonzo Grieve, MD  09/09/2017, 9:37 AM    I have personally seen and examined this patient with Dr. Jari Favre. I agree with the assessment and plan as outlined  above. He is still having chest pain but refusing his medications. BP is still elevated because he is not taking his meds. His dementia is impeding the progression of his medical care. He really needs palliation. Would recommend that the primary team seek a palliative care consult and work with SW and Palliative care to assign a guardian since his family is not responding to calls. We will not plan any cardiac workup. I would try to titrate his meds if he is willing to take them. We will sign off. Please call with questions.   Lauree Chandler 09/09/2017 10:39 AM

## 2017-09-09 NOTE — Clinical Social Work Note (Signed)
CSW met with patient and confirmed wife's phone number and address. CSW called [facility] and requested wellness check. They will call back once they make contact with patient's wife. Dispatch asked about weapons in the home. CSW informed her that patient mentioned having a 71.  Shawn Pearson, [place]

## 2017-09-09 NOTE — Care Management Important Message (Signed)
Important Message  Patient Details  Name: Shawn Pearson MRN: [id number] Date of Birth: Oct 17, 1941   Medicare Important Message Given:  Yes    [name] [name] 09/09/2017, 12:56 PM

## 2017-09-09 NOTE — Progress Notes (Signed)
Pt refused for RN to do assessment. Pt is in no obvious distress at this time.

## 2017-09-09 NOTE — Progress Notes (Signed)
OT Cancellation Note  Patient Details Name: Shawn Pearson MRN: [id number] DOB: 1941-03-21   Cancelled Treatment:    Reason Eval/Treat Not Completed: Patient declined, no reason specified. Pt declined to participate in therapies at this time. Will check back as able.   Norman Herrlich, MS OTR/L  Pager: Shawn Pearson 09/09/2017, 11:28 AM

## 2017-09-10 LAB — GLUCOSE, CAPILLARY
GLUCOSE-CAPILLARY: 150 mg/dL — AB (ref 65–99)
GLUCOSE-CAPILLARY: 153 mg/dL — AB (ref 65–99)

## 2017-09-10 LAB — BASIC METABOLIC PANEL
ANION GAP: 9 (ref 5–15)
BUN: 29 mg/dL — ABNORMAL HIGH (ref 6–20)
CALCIUM: 8.5 mg/dL — AB (ref 8.9–10.3)
CO2: 23 mmol/L (ref 22–32)
Chloride: 102 mmol/L (ref 101–111)
Creatinine, Ser: 1.51 mg/dL — ABNORMAL HIGH (ref 0.61–1.24)
GFR calc Af Amer: 50 mL/min — ABNORMAL LOW (ref >60)
GFR, EST NON AFRICAN AMERICAN: 43 mL/min — AB (ref >60)
GLUCOSE: 164 mg/dL — AB (ref 65–99)
POTASSIUM: 4.4 mmol/L (ref 3.5–5.1)
SODIUM: 134 mmol/L — AB (ref 135–145)

## 2017-09-10 MED ORDER — HYDRALAZINE HCL 20 MG/ML IJ SOLN
20.0000 mg | Freq: Four times a day (QID) | INTRAMUSCULAR | Status: DC | PRN
  Administered 2017-09-10 – 2017-09-22 (×6): 20 mg via INTRAVENOUS
  Filled 2017-09-10 (×9): qty 1

## 2017-09-10 MED ORDER — LORAZEPAM 2 MG/ML IJ SOLN
0.5000 mg | Freq: Four times a day (QID) | INTRAMUSCULAR | Status: DC | PRN
  Administered 2017-09-10 (×2): 1 mg via INTRAVENOUS
  Filled 2017-09-10 (×2): qty 1

## 2017-09-10 MED ORDER — CLONIDINE HCL 0.1 MG/24HR TD PTWK
0.1000 mg | MEDICATED_PATCH | TRANSDERMAL | Status: DC
  Administered 2017-09-10 – 2017-10-01 (×4): 0.1 mg via TRANSDERMAL
  Filled 2017-09-10 (×5): qty 1

## 2017-09-10 MED ORDER — VALPROATE SODIUM 500 MG/5ML IV SOLN
125.0000 mg | Freq: Every day | INTRAVENOUS | Status: DC
  Administered 2017-09-10: 125 mg via INTRAVENOUS
  Filled 2017-09-10 (×2): qty 1.25

## 2017-09-10 MED ORDER — HALOPERIDOL LACTATE 5 MG/ML IJ SOLN
2.0000 mg | Freq: Four times a day (QID) | INTRAMUSCULAR | Status: DC | PRN
  Administered 2017-09-10: 2 mg via INTRAVENOUS
  Filled 2017-09-10: qty 1

## 2017-09-10 NOTE — Progress Notes (Signed)
Pt refused for vitals to be taken.

## 2017-09-10 NOTE — Progress Notes (Signed)
PROGRESS NOTE    Shawn Pearson  ZDG:[id number] DOB: May 17, 1941 DOA: 09/05/2017 PCP: Mabeline Caras, NP    Brief Narrative: Brief Narrative:  Patient is 76 year old male with dementia, CK D, hypertension, medical noncompliance with medications, negative stress test in February 2018, admitted from skilled nursing facility with chest pain and uncontrolled blood pressure due to noncompliance with medications.      Assessment & Plan:   Principal Problem:   Dementia Active Problems:   Hypertensive urgency   CKD stage 3 secondary to diabetes (HCC)   Chest pain   Noncompliance with medication regimen   Agitation   Elevated troponin  1-Chest pain ; Started  Imdur.  Continue to controlled BP.  Cardiology recommend BP control, and no further invasive test.  I have added IV hydralazine. Patient refusing to take meds.   2-HTN Urgency;  Refuse to take medications. ;  Norvasc, coreg, lasix, cozaar.  IV hydralazine, clonidine patch ordered.   3-Dementia, suicidal , homicidal ideation.  Reported homicidal ideation to cardiologist on 11-20.[facility] Psych  consulted again.  Safety sitter at bedside.  Refuse medications.  Evaluated by psych, use restrain as needed if he is risk of harming others.  I have order Ativan.   4-DM type 2; On lantus and SSI>   5-CKD stage III; Labs ordered again.   6-Noncompliance with medication regimen   7-Anemia; Iron deficiency.  Occult blood negative.  Refuse labs .   8-Invasive  rectal cancer.   DVT prophylaxis: Lovenox Code Status: Full code.  Family Communication: none at bedside.  Disposition Plan: await psych re-evaluation.    Consultants:   Cardiology  Psych   Procedures:  ECHO; Left ventricle: The cavity size was normal. There was moderate   concentric hypertrophy. Systolic function was mildly to   moderately reduced. The estimated ejection fraction was in the   range of 40% to 45%. Hypokineis in the inferoseptal and inferior  myocardium. Features are consistent with a pseudonormal left   ventricular filling pattern, with concomitant abnormal relaxation   and increased filling pressure (grade 2 diastolic dysfunction).   Doppler parameters are consistent with high ventricular filling   pressure. - Aortic valve: Transvalvular velocity was within the normal range.   There was no stenosis. There was no regurgitation. - Mitral valve: Transvalvular velocity was within the normal range.   There was no evidence for stenosis. There was mild regurgitation. - Left atrium: The atrium was moderately dilated. - Right ventricle: The cavity size was normal. Wall thickness was   normal. Systolic function was normal. - Right atrium: The atrium was moderately dilated. - Atrial septum: No defect or patent foramen ovale was identified   by color flow Doppler. - Tricuspid valve: There was mild regurgitation. - Pulmonary arteries: Systolic pressure was within the normal   range. PA peak pressure: 29 mm Hg (S).    Antimicrobials:  none   Subjective: He is more aggressive today, verbally abusive. Throwing things to staff.   Objective: Vitals:   09/08/17 1656 09/08/17 2216 09/09/17 0647 09/09/17 2209  BP: (!) 178/81 (!) 175/90 (!) 171/59 (!) 179/74  Pulse: 82 80 82 81  Resp:  20 18   Temp:  98.4 F (36.9 C) 99 F (37.2 C) 98.7 F (37.1 C)  TempSrc:  Oral Oral   SpO2:  98% 99% 100%  Weight:   90.6 kg (199 lb 11.8 oz)   Height:        Intake/Output Summary (Last 24 hours) at 09/10/2017 1030  Last data filed at 09/10/2017 0600 Gross per 24 hour  Intake 900 ml  Output 2325 ml  Net -1425 ml   Filed Weights   09/06/17 0205 09/08/17 0409 09/09/17 0647  Weight: 107.9 kg (237 lb 12.8 oz) 93.5 kg (206 lb 2.1 oz) 90.6 kg (199 lb 11.8 oz)    Examination:  Decline physical exam.  Alert Bilateral BKA    Data Reviewed: I have personally reviewed following labs and imaging studies  CBC: Recent Labs  Lab  09/05/17 2210  WBC 7.3  NEUTROABS 4.7  HGB 9.4*  HCT 28.9*  MCV 79.4  PLT 654*   Basic Metabolic Panel: Recent Labs  Lab 09/05/17 2210  NA 134*  K 3.3*  CL 105  CO2 22  GLUCOSE 167*  BUN 14  CREATININE 1.46*  CALCIUM 8.2*   GFR: Estimated Creatinine Clearance: 49.3 mL/min (A) (by C-G formula based on SCr of 1.46 mg/dL (H)). Liver Function Tests: Recent Labs  Lab 09/05/17 2210  AST 18  ALT 8*  ALKPHOS 64  BILITOT 0.4  PROT 6.2*  ALBUMIN 2.4*   No results for input(s): LIPASE, AMYLASE in the last 168 hours. No results for input(s): AMMONIA in the last 168 hours. Coagulation Profile: No results for input(s): INR, PROTIME in the last 168 hours. Cardiac Enzymes: Recent Labs  Lab 09/05/17 2305  TROPONINI 0.12*   BNP (last 3 results) No results for input(s): PROBNP in the last 8760 hours. HbA1C: No results for input(s): HGBA1C in the last 72 hours. CBG: Recent Labs  Lab 09/08/17 1218  GLUCAP 179*   Lipid Profile: No results for input(s): CHOL, HDL, LDLCALC, TRIG, CHOLHDL, LDLDIRECT in the last 72 hours. Thyroid Function Tests: No results for input(s): TSH, T4TOTAL, FREET4, T3FREE, THYROIDAB in the last 72 hours. Anemia Panel: No results for input(s): VITAMINB12, FOLATE, FERRITIN, TIBC, IRON, RETICCTPCT in the last 72 hours. Sepsis Labs: No results for input(s): PROCALCITON, LATICACIDVEN in the last 168 hours.  No results found for this or any previous visit (from the past 240 hour(s)).       Radiology Studies: No results found.      Scheduled Meds: . amLODipine  10 mg Oral Daily  . aspirin EC  325 mg Oral Daily  . atorvastatin  40 mg Oral QHS  . carvedilol  12.5 mg Oral BID WC  . cloNIDine  0.1 mg Transdermal Weekly  . enoxaparin (LOVENOX) injection  40 mg Subcutaneous Daily  . feeding supplement (ENSURE ENLIVE)  237 mL Oral TID BM  . finasteride  5 mg Oral Daily  . insulin aspart  0-9 Units Subcutaneous TID WC  . insulin glargine  6  Units Subcutaneous QHS  . isosorbide mononitrate  30 mg Oral Daily  . losartan  100 mg Oral Daily  . polyethylene glycol  17 g Oral Daily  . tamsulosin  0.4 mg Oral Daily  . traMADol  50 mg Oral Once   Continuous Infusions: . valproate sodium       LOS: 4 days    Time spent: 35 minutes.     Elmarie Shiley, MD [facility] Pager [phone]  If 7PM-7AM, please contact night-coverage [email or web address] Password [email or web address] 09/10/2017, 10:30 AM
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
## 2017-09-10 NOTE — Progress Notes (Signed)
Pt. Refusing insulin and to have CBG taken. BP also elevated.  On call NP for [facility] aware.

## 2017-09-10 NOTE — Progress Notes (Signed)
Pt continues to be verbally and physically aggressive with staff members.

## 2017-09-10 NOTE — Progress Notes (Addendum)
Called pt wife, Mickel Baas to make her aware that pt has been placed in restraints. She stated "it doesn't matter I am not coming to visit but thanks for letting me know."

## 2017-09-10 NOTE — Progress Notes (Signed)
Security called to bedside again to assist in controlling pt behavior. Pt physically aggressive with staff. Linens changed, and restraints removed and adjusted.
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
## 2017-09-10 NOTE — Progress Notes (Signed)
RN called to bedside by lab due to patient refusing blood draw. This RN, 2 NT and another RN assisted in holding pt still while lab work was drawn. Placed mask on pt face as he is spitting at staff.

## 2017-09-11 LAB — MAGNESIUM: Magnesium: 2.1 mg/dL (ref 1.7–2.4)

## 2017-09-11 LAB — TROPONIN I
TROPONIN I: 0.05 ng/mL — AB (ref <0.03)
Troponin I: 0.06 ng/mL (ref <0.03)

## 2017-09-11 LAB — GLUCOSE, CAPILLARY
GLUCOSE-CAPILLARY: 180 mg/dL — AB (ref 65–99)
GLUCOSE-CAPILLARY: 315 mg/dL — AB (ref 65–99)
Glucose-Capillary: 179 mg/dL — ABNORMAL HIGH (ref 65–99)

## 2017-09-11 MED ORDER — METOPROLOL TARTRATE 5 MG/5ML IV SOLN
5.0000 mg | Freq: Once | INTRAVENOUS | Status: AC
  Administered 2017-09-11: 5 mg via INTRAVENOUS
  Filled 2017-09-11: qty 5

## 2017-09-11 MED ORDER — MORPHINE SULFATE (PF) 2 MG/ML IV SOLN
0.5000 mg | Freq: Once | INTRAVENOUS | Status: AC
  Administered 2017-09-11: 0.5 mg via INTRAVENOUS
  Filled 2017-09-11: qty 1

## 2017-09-11 MED ORDER — VALPROATE SODIUM 500 MG/5ML IV SOLN: 125.0000 mg | Freq: Two times a day (BID) | INTRAVENOUS | Status: DC

## 2017-09-11 MED ORDER — MELATONIN 3 MG PO TABS
3.0000 mg | ORAL_TABLET | Freq: Every day | ORAL | Status: DC
  Administered 2017-09-11 – 2017-11-05 (×49): 3 mg via ORAL
  Filled 2017-09-11 (×60): qty 1

## 2017-09-11 MED ORDER — VALPROATE SODIUM 500 MG/5ML IV SOLN
125.0000 mg | Freq: Two times a day (BID) | INTRAVENOUS | Status: DC
  Administered 2017-09-11 – 2017-09-16 (×8): 125 mg via INTRAVENOUS
  Filled 2017-09-11 (×11): qty 1.25

## 2017-09-11 MED ORDER — LABETALOL HCL 5 MG/ML IV SOLN: 10.0000 mg | INTRAVENOUS | Status: DC | PRN

## 2017-09-11 MED ORDER — NYSTATIN 100000 UNIT/ML MT SUSP
5.0000 mL | Freq: Four times a day (QID) | OROMUCOSAL | Status: DC
  Administered 2017-09-11 – 2017-09-28 (×32): 500000 [IU] via ORAL
  Filled 2017-09-11 (×46): qty 5

## 2017-09-11 NOTE — Progress Notes (Signed)
Patient complaining of chest pain 10/10. EKG done . Test paged Dr Myna Hidalgo and called rapid response nurse.

## 2017-09-11 NOTE — Progress Notes (Signed)
RN called for new onset CP. Dr. Myna Hidalgo paged PTA. EKG completed, nitro SL x1 given PTA. No interventions from RRT as pt was asleep on arrival. Easily awakened with verbal stimuli, pt states "I'm constipated." Denies CP at time of bedside assessment. Advised RN to call if needed.

## 2017-09-11 NOTE — Progress Notes (Signed)
OT Cancellation Note  Patient Details Name: Shawn Pearson MRN: [id number] DOB: September 24, 1941   Cancelled Treatment:    Reason Eval/Treat Not Completed: Medical issues which prohibited therapy.  Pt has been combative, and is now asleep.  Rn requested OT/PT hold off.  Will reattempt.  [place], OTR/L [phone]   Lucille Passy M 09/11/2017, 2:28 PM

## 2017-09-11 NOTE — Progress Notes (Signed)
PT Cancellation Note  Patient Details Name: Shawn Pearson MRN: [id number] DOB: [birth year]   Cancelled Treatment:    Reason Eval/Treat Not Completed: Other (comment). Pt sleeping when checked on. Has been agitated. Will continue to check on for appropriateness for therapy.   Shary Decamp [place] 09/11/2017, 2:03 PM [employer] PT [phone]

## 2017-09-11 NOTE — Consult Note (Addendum)
[facility] Psych Consult Progress Note  09/11/2017 4:38 PM Steen Bisig  MRN:  [id number] Subjective:   Mr. Shawn Pearson was asleep in bed this afternoon but able to be aroused. He reports that he is "sick" when asked about his mood. His sitter reports that he was agitated this morning but did converse with her for a bit this afternoon. He received Haldol 2 mg and Ativan 1 mg (x 2) overnight for agitation.  Principal Problem: Dementia Diagnosis:   Patient Active Problem List   Diagnosis Date Noted  . Elevated troponin [R74.8]   . Agitation [R45.1]   . Dementia [F03.90] 09/06/2017  . Noncompliance with medication regimen [Z91.14] 09/06/2017  . Anemia [D64.9] 08/30/2017  . Hypertension [I10] 08/30/2017  . Hypertensive urgency [I16.0] 08/21/2017  . DM2 (diabetes mellitus, type 2) ([place]) [E11.9] 08/21/2017  . CKD stage 3 secondary to diabetes ([place]) [T61.44, N18.3] 08/21/2017  . 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 Hypertensive emergency [I16.1] 08/21/2017  . Chest pain [R07.9]    Total Time spent with patient: 15 minutes  Past Psychiatric History: Dementia   Past Medical History:  Past Medical History:  Diagnosis Date  . Aortic atherosclerosis ([place])   . Dementia   . Diabetes mellitus without complication ([place])   . History of angina   . Hypertension   . Renal disorder    CKD stage 3    Past Surgical History:  Procedure Laterality Date  . BELOW KNEE LEG AMPUTATION Bilateral    Family History: History reviewed. No pertinent family history. Family Psychiatric  History: Unknown  Social History:  Social History   Substance and Sexual Activity  Alcohol Use No     Social History   Substance and Sexual Activity  Drug Use No    Social History   Socioeconomic History  . Marital status: Widowed    Spouse name: None  . Number of children: None  . Years of education: None  . Highest education level: None  Social Needs  . Financial resource strain: None  .  Food insecurity - worry: None  . Food insecurity - inability: None  . Transportation needs - medical: None  . Transportation needs - non-medical: None  Occupational History  . None  Tobacco Use  . Smoking status: Unknown If Ever Smoked  . Smokeless tobacco: Never Used  Substance and Sexual Activity  . Alcohol use: No  . Drug use: No  . Sexual activity: No  Other Topics Concern  . None  Social History Narrative  . None    Sleep: Good  Appetite:  Poor  Current Medications: Current Facility-Administered Medications  Medication Dose Route Frequency Provider Last Rate Last Dose  . acetaminophen (TYLENOL) tablet 650 mg  650 mg Oral Q4H PRN Phillips Grout, MD   650 mg at 09/06/17 2335  . amLODipine (NORVASC) tablet 10 mg  10 mg Oral Daily Derrill Kay A, MD   10 mg at 09/08/17 0859  . aspirin EC tablet 325 mg  325 mg Oral Daily Phillips Grout, MD   325 mg at 09/11/17 0944  . atorvastatin (LIPITOR) tablet 40 mg  40 mg Oral QHS Derrill Kay A, MD   40 mg at 09/09/17 2322  . carvedilol (COREG) tablet 12.5 mg  12.5 mg Oral BID WC Alphonzo Grieve, MD   12.5 mg at 09/11/17 1614  . cloNIDine (CATAPRES - Dosed in mg/24 hr) patch 0.1 mg  0.1 mg Transdermal Weekly Regalado, Cassie Freer, MD  0.1 mg at 09/10/17 1917  . enoxaparin (LOVENOX) injection 40 mg  40 mg Subcutaneous Daily Theodis Blaze, MD   40 mg at 09/11/17 0955  . feeding supplement (ENSURE ENLIVE) (ENSURE ENLIVE) liquid 237 mL  237 mL Oral TID BM Theodis Blaze, MD   237 mL at 09/11/17 1405  . finasteride (PROSCAR) tablet 5 mg  5 mg Oral Daily Derrill Kay A, MD   5 mg at 09/11/17 0944  . hydrALAZINE (APRESOLINE) injection 20 mg  20 mg Intravenous Q6H PRN Regalado, Belkys A, MD   20 mg at 09/10/17 1819  . insulin aspart (novoLOG) injection 0-9 Units  0-9 Units Subcutaneous TID WC Theodis Blaze, MD   2 Units at 09/11/17 [phone]  . insulin glargine (LANTUS) injection 6 Units  6 Units Subcutaneous QHS Derrill Kay A, MD      .  isosorbide mononitrate (IMDUR) 24 hr tablet 30 mg  30 mg Oral Daily Regalado, Belkys A, MD      . labetalol (NORMODYNE,TRANDATE) injection 10 mg  10 mg Intravenous Q2H PRN Opyd, Ilene Qua, MD      . LORazepam (ATIVAN) injection 0.5-1 mg  0.5-1 mg Intravenous Q6H PRN Regalado, Belkys A, MD   1 mg at 09/10/17 1656  . Melatonin TABS 3 mg  3 mg Oral QHS Regalado, Belkys A, MD      . nitroGLYCERIN (NITROSTAT) SL tablet 0.4 mg  0.4 mg Sublingual PRN Phillips Grout, MD   0.4 mg at 09/11/17 0943  . nystatin (MYCOSTATIN) 100000 UNIT/ML suspension 500,000 Units  5 mL Oral QID Regalado, Belkys A, MD      . ondansetron (ZOFRAN) injection 4 mg  4 mg Intravenous Q6H PRN Phillips Grout, MD   4 mg at 09/07/17 1459  . polyethylene glycol (MIRALAX / GLYCOLAX) packet 17 g  17 g Oral Daily Janett Billow, RPH   17 g at 09/08/17 9735  . polyvinyl alcohol (LIQUIFILM TEARS) 1.4 % ophthalmic solution 1 drop  1 drop Both Eyes PRN Dana Allan I, MD      . tamsulosin (FLOMAX) capsule 0.4 mg  0.4 mg Oral Daily Derrill Kay A, MD   0.4 mg at 09/11/17 0944  . traMADol (ULTRAM) tablet 50 mg  50 mg Oral Once Bodenheimer, Charles A, NP      . valproate (DEPACON) 125 mg in dextrose 5 % 50 mL IVPB  125 mg Intravenous Q12H Regalado, Belkys A, MD 51.3 mL/hr at 09/11/17 1613 125 mg at 09/11/17 1613    Lab Results:  Results for orders placed or performed during the hospital encounter of 09/05/17 (from the past 48 hour(s))  Glucose, capillary     Status: Abnormal   Collection Time: 09/10/17 11:04 AM  Result Value Ref Range   Glucose-Capillary 150 (H) 65 - 99 mg/dL   Comment 1 Notify RN   Basic metabolic panel     Status: Abnormal   Collection Time: 09/10/17  2:12 PM  Result Value Ref Range   Sodium 134 (L) 135 - 145 mmol/L   Potassium 4.4 3.5 - 5.1 mmol/L    Comment: HEMOLYSIS AT THIS LEVEL MAY AFFECT RESULT   Chloride 102 101 - 111 mmol/L   CO2 23 22 - 32 mmol/L   Glucose, Bld 164 (H) 65 - 99 mg/dL   BUN 29 (H)  6 - 20 mg/dL   Creatinine, Ser 1.51 (H) 0.61 - 1.24 mg/dL   Calcium 8.5 (L) 8.9 - 10.3 mg/dL  GFR calc non Af Amer 43 (L) >60 mL/min   GFR calc Af Amer 50 (L) >60 mL/min    Comment: (NOTE) The eGFR has been calculated using the CKD EPI equation. This calculation has not been validated in all clinical situations. eGFR's persistently <60 mL/min signify possible Chronic Kidney Disease.    Anion gap 9 5 - 15  Glucose, capillary     Status: Abnormal   Collection Time: 09/10/17  4:53 PM  Result Value Ref Range   Glucose-Capillary 153 (H) 65 - 99 mg/dL  Glucose, capillary     Status: Abnormal   Collection Time: 09/10/17 10:05 PM  Result Value Ref Range   Glucose-Capillary 180 (H) 65 - 99 mg/dL  Glucose, capillary     Status: Abnormal   Collection Time: 09/11/17  9:11 AM  Result Value Ref Range   Glucose-Capillary 179 (H) 65 - 99 mg/dL  Magnesium     Status: None   Collection Time: 09/11/17 10:33 AM  Result Value Ref Range   Magnesium 2.1 1.7 - 2.4 mg/dL  Troponin I (q 6hr x 3)     Status: Abnormal   Collection Time: 09/11/17 10:33 AM  Result Value Ref Range   Troponin I 0.06 (HH) <0.03 ng/mL    Comment: CRITICAL VALUE NOTED.  VALUE IS CONSISTENT WITH PREVIOUSLY REPORTED AND CALLED VALUE.    Blood Alcohol level:  No results found for: [facility]   Musculoskeletal: Strength & Muscle Tone: UTA since patient only speaks to notewriter briefly before falling back asleep. Gait & Station: He has bilateral BKA. Patient leans: N/A    Psychiatric Specialty Exam: Physical Exam  Nursing note and vitals reviewed. Constitutional: He appears well-developed and well-nourished.  Neck: Normal range of motion.  Respiratory: Effort normal.  Musculoskeletal: Normal range of motion.  Neurological: He is alert.  Oriented to person.   Skin: No rash noted.    Review of Systems  Cardiovascular: Positive for chest pain.  Neurological: Positive for headaches.   unable to fully assess since patient  was only briefly awake to interview.   Blood pressure (!) 152/47, pulse 86, temperature 98.7 F (37.1 C), resp. rate 16, height 6' 2"  (1.88 m), weight 90.6 kg (199 lb 11.8 oz), SpO2 99 %.Body mass index is 25.64 kg/m.  General Appearance: Well Groomed, elderly, African American male with bilateral BKA who is lying in bed with a hospital gown. NAD.   Eye Contact:  Poor  Speech:  Normal Rate  Volume:  Normal  Mood:  "Sick"  Affect:  Irritable.  Thought Process:  Linear  Orientation:  Other:  Oriented to self.  Thought Content:  He reports feeling sick and reports pain in his chest and head.  Suicidal Thoughts:  UTA since patient was only briefly awake to interview.  Homicidal Thoughts:  UTA since patient was only briefly awake to interview.  Memory:  UTA since patient was only briefly awake to interview.  Judgement:  Impaired  Insight:  Lacking  Psychomotor Activity:  Decreased  Concentration:  Concentration: Poor and Attention Span: Poor  Recall:  UTA since patient was only briefly awake to interview.  Fund of Knowledge:  Poor  Language:  Poor  Akathisia:  UTA since patient was only briefly awake to interview.  Handed:  Right  AIMS (if indicated):   N/A  Assets:  Housing  ADL's:  Impaired and requires assistance.   Cognition: UTA since patient was only briefly awake to interview. History of dementia.   Sleep:  Appears good today.    Assessment:  Shawn Pearson is a 76 y.o. male who was admitted withchest pain in the setting of uncontrolled blood pressure secondary to poor medication compliance.He continues to be agitated and refuses care including medications and blood draws. He has reportedly not been eating well and sleeping has been variable (appears he sleeps during the day). Per record review, he has a history of dementia and likely has behavioral disturbance secondary to this condition. Unfortunately he has prolonged QTc (526) so antipsychotic medications are not recommended  given risk for arrhythmias. He appears to tolerate Depakote well so it can be increased for agitation and to avoid use of benzodiazepines which can worsen confusion and cause disinhibition.   Treatment Plan Summary:  -Increase Depakote 125 mg qhs to 125 mg BID for agitation. Can be given in IV or sprinkle formulation in food/drink.  -Avoid medications that can worsen confusion and agitation such as antihistamines, anticholingerics and benzodiazepines.  -Start Melatonin 5 mg qhs to regulate sleep/wake cycle. Encourage patient to stay awake in the daytime so that he may sleep at night.  -Antipsychotic use is not advised given prolonged QTc and risk for arrhthymias (QTc 559 on 11/23, increased from 526 on 11/17). -Use soft restraints as needed for imminent risk of harming self or others and if needed for emergent treatment.  -Consider neurology consult for management of dementia.  -Psychiatry will follow patient as clinically needed.    Faythe Dingwall, DO 09/11/2017, 4:38 PM    Addendum on 11/27: Based on collateral report, patient has been agitated and requiring a 1:1 sitter since admission and given his history of bipolar disorder he will benefit from inpatient geropsychiatric hospitalization for further treatment and stabilization.

## 2017-09-11 NOTE — Progress Notes (Signed)
Patient a little more cooperative today. He took 1 pill this am which is Coreg. Was able to check BP and BS which he refused earlier. But still not taking 10am meds including nitro and EKG because he wants to finish eating. He already refused lab draw and telemetry. Notified Dr. Tyrell Antonio

## 2017-09-11 NOTE — Progress Notes (Signed)
Care resumed from [place] at 77pm

## 2017-09-11 NOTE — Progress Notes (Signed)
Patient continues to remain calm with nurse and sitter.

## 2017-09-11 NOTE — Progress Notes (Signed)
Prune Juice received from another unit. Upon entering patient room, patient was found sleeping comfortably with sitter at bedside. Sitter informed to call when patient wakes up so that prune juice will be given to patient.

## 2017-09-11 NOTE — Progress Notes (Signed)
Patient still complaining of chest pain earlier even after giving nitro, called Dr. Tyrell Antonio and told her that the patient wasn't relieved with 1st Nitro but bp went down from 166/83 to 109/52 . She said to not give any more anti HTN meds. Morphine IV given x1 and pain was relieved. He finally agreed to have lab tech drew blood on him. EKG on chart, already took his morning meds.
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
## 2017-09-11 NOTE — Progress Notes (Signed)
PROGRESS NOTE    Shawn Pearson  NTI:[id number] DOB: 1941/08/30 DOA: 09/05/2017 PCP: Mabeline Caras, NP    Brief Narrative: Brief Narrative:  Patient is 76 year old male with dementia, CK D, hypertension, medical noncompliance with medications, negative stress test in February 2018, admitted from skilled nursing facility with chest pain and uncontrolled blood pressure due to noncompliance with medications.  Assessment & Plan:   Principal Problem:   Dementia Active Problems:   Hypertensive urgency   CKD stage 3 secondary to diabetes (HCC)   Chest pain   Noncompliance with medication regimen   Agitation   Elevated troponin  1-Chest pain ; Started  Imdur.  Continue to controlled BP.  Cardiology recommend BP control, and no further invasive test.  I have added IV hydralazine. Patient was refusing to take meds.   Complaints of chest pain this am and last night. Relates nitroglycerin helps.  Will cycle cardiac enzymes, check EKG. Nurse to give aspirin, BP medications.   2-HTN Urgency;  Refuse to take medications. ;  Norvasc, coreg, lasix, cozaar.  IV hydralazine, clonidine patch ordered.   3-Dementia, suicidal , homicidal ideation.  Reported homicidal ideation to cardiologist on 11-20.Marland Kitchen Psych  consulted again.  Safety sitter at bedside.  Refuse medications.  Evaluated by psych, use restrain as needed if he is risk of harming others.  I have order Ativan.  Started IV depakote.   4-DM type 2; On lantus and SSI>   5-CKD stage III; cr range 1.3--1.5 Stable. Hold cozaar.   6-Noncompliance with medication regimen   7-Anemia; Iron deficiency.  Occult blood negative.  Refuse labs .   8-Invasive  rectal cancer.  9-oral thrush; start nystatin.   DVT prophylaxis: Lovenox Code Status: Full code.  Family Communication: none at bedside.  Disposition Plan: await psych re-evaluation.    Consultants:   Cardiology  Psych   Procedures:  ECHO; Left ventricle: The cavity  size was normal. There was moderate   concentric hypertrophy. Systolic function was mildly to   moderately reduced. The estimated ejection fraction was in the   range of 40% to 45%. Hypokineis in the inferoseptal and inferior   myocardium. Features are consistent with a pseudonormal left   ventricular filling pattern, with concomitant abnormal relaxation   and increased filling pressure (grade 2 diastolic dysfunction).   Doppler parameters are consistent with high ventricular filling   pressure. - Aortic valve: Transvalvular velocity was within the normal range.   There was no stenosis. There was no regurgitation. - Mitral valve: Transvalvular velocity was within the normal range.   There was no evidence for stenosis. There was mild regurgitation. - Left atrium: The atrium was moderately dilated. - Right ventricle: The cavity size was normal. Wall thickness was   normal. Systolic function was normal. - Right atrium: The atrium was moderately dilated. - Atrial septum: No defect or patent foramen ovale was identified   by color flow Doppler. - Tricuspid valve: There was mild regurgitation. - Pulmonary arteries: Systolic pressure was within the normal   range. PA peak pressure: 29 mm Hg (S).    Antimicrobials:  none   Subjective: He is more cooperative this am. Complaints of chest pain. Not feeling well. Has oral thrush.    Objective: Vitals:   09/11/17 0329 09/11/17 0612 09/11/17 0613 09/11/17 0849  BP: (!) 192/92 (!) 165/65 (!) 165/62 (!) 166/83  Pulse: 87 87 81 74  Resp: 16     Temp:      TempSrc:  SpO2: 99%     Weight:      Height:        Intake/Output Summary (Last 24 hours) at 09/11/2017 0914 Last data filed at 09/11/2017 0617 Gross per 24 hour  Intake 357 ml  Output 50 ml  Net 307 ml   Filed Weights   09/06/17 0205 09/08/17 0409 09/09/17 0647  Weight: 107.9 kg (237 lb 12.8 oz) 93.5 kg (206 lb 2.1 oz) 90.6 kg (199 lb 11.8 oz)     Examination:  General; calm, alert, more cooperative.  Neck supple, no JVD Heart; S 1, S 2 RRR Lungs; CTA Abdomen; soft, nt BL AKA    Data Reviewed: I have personally reviewed following labs and imaging studies  CBC: Recent Labs  Lab 09/05/17 2210  WBC 7.3  NEUTROABS 4.7  HGB 9.4*  HCT 28.9*  MCV 79.4  PLT 518*   Basic Metabolic Panel: Recent Labs  Lab 09/05/17 2210 09/10/17 1412  NA 134* 134*  K 3.3* 4.4  CL 105 102  CO2 22 23  GLUCOSE 167* 164*  BUN 14 29*  CREATININE 1.46* 1.51*  CALCIUM 8.2* 8.5*   GFR: Estimated Creatinine Clearance: 47.6 mL/min (A) (by C-G formula based on SCr of 1.51 mg/dL (H)). Liver Function Tests: Recent Labs  Lab 09/05/17 2210  AST 18  ALT 8*  ALKPHOS 64  BILITOT 0.4  PROT 6.2*  ALBUMIN 2.4*   No results for input(s): LIPASE, AMYLASE in the last 168 hours. No results for input(s): AMMONIA in the last 168 hours. Coagulation Profile: No results for input(s): INR, PROTIME in the last 168 hours. Cardiac Enzymes: Recent Labs  Lab 09/05/17 2305  TROPONINI 0.12*   BNP (last 3 results) No results for input(s): PROBNP in the last 8760 hours. HbA1C: No results for input(s): HGBA1C in the last 72 hours. CBG: Recent Labs  Lab 09/08/17 1218 09/10/17 1104 09/10/17 1653 09/10/17 2205 09/11/17 0911  GLUCAP 179* 150* 153* 180* 179*   Lipid Profile: No results for input(s): CHOL, HDL, LDLCALC, TRIG, CHOLHDL, LDLDIRECT in the last 72 hours. Thyroid Function Tests: No results for input(s): TSH, T4TOTAL, FREET4, T3FREE, THYROIDAB in the last 72 hours. Anemia Panel: No results for input(s): VITAMINB12, FOLATE, FERRITIN, TIBC, IRON, RETICCTPCT in the last 72 hours. Sepsis Labs: No results for input(s): PROCALCITON, LATICACIDVEN in the last 168 hours.  No results found for this or any previous visit (from the past 240 hour(s)).       Radiology Studies: No results found.      Scheduled Meds: . amLODipine  10 mg  Oral Daily  . aspirin EC  325 mg Oral Daily  . atorvastatin  40 mg Oral QHS  . carvedilol  12.5 mg Oral BID WC  . cloNIDine  0.1 mg Transdermal Weekly  . enoxaparin (LOVENOX) injection  40 mg Subcutaneous Daily  . feeding supplement (ENSURE ENLIVE)  237 mL Oral TID BM  . finasteride  5 mg Oral Daily  . insulin aspart  0-9 Units Subcutaneous TID WC  . insulin glargine  6 Units Subcutaneous QHS  . isosorbide mononitrate  30 mg Oral Daily  . polyethylene glycol  17 g Oral Daily  . tamsulosin  0.4 mg Oral Daily  . traMADol  50 mg Oral Once   Continuous Infusions: . valproate sodium Stopped (09/11/17 0014)     LOS: 5 days    Time spent: 35 minutes.     Elmarie Shiley, MD [facility] Pager [phone]  If  7PM-7AM, please contact night-coverage [email or web address] Password [facility] 09/11/2017, 9:14 AM

## 2017-09-11 NOTE — Progress Notes (Signed)
Patient informed nurse  " Nurse help me , my chest hurts "Nitro SL  given to patient for chest pain relieve.

## 2017-09-11 NOTE — Progress Notes (Signed)
Call back received from Dr Myna Hidalgo. MD informed EKG has been done. MD also informed to see chart for EKG and vital signs. MD stated " I will order metoprolol and ok to give nitro now"

## 2017-09-11 NOTE — Progress Notes (Signed)
Patient informed sitter he wants his restraints to be taken off. Patient agreed to be cooperative with nurse and sitter. Restraints taken off per patient request. Will monitor patient closely for any changes.

## 2017-09-11 NOTE — Progress Notes (Signed)
Patient complaining of constipation. Patient informed nurse will get him some prune juice to help with constipation.

## 2017-09-12 LAB — GLUCOSE, CAPILLARY: GLUCOSE-CAPILLARY: 231 mg/dL — AB (ref 65–99)

## 2017-09-12 MED ORDER — AMLODIPINE BESYLATE 5 MG PO TABS: 5.0000 mg | ORAL_TABLET | Freq: Every day | ORAL | Status: DC

## 2017-09-12 MED ORDER — FLUCONAZOLE 100MG IVPB
100.0000 mg | INTRAVENOUS | Status: DC
  Administered 2017-09-12 – 2017-09-16 (×4): 100 mg via INTRAVENOUS
  Filled 2017-09-12 (×5): qty 50

## 2017-09-12 MED ORDER — PANTOPRAZOLE SODIUM 40 MG IV SOLR
40.0000 mg | Freq: Two times a day (BID) | INTRAVENOUS | Status: DC
  Administered 2017-09-12 – 2017-09-15 (×5): 40 mg via INTRAVENOUS
  Filled 2017-09-12 (×9): qty 40

## 2017-09-12 MED ORDER — SODIUM CHLORIDE 0.9% FLUSH
3.0000 mL | Freq: Two times a day (BID) | INTRAVENOUS | Status: DC
  Administered 2017-09-13 – 2017-09-27 (×23): 3 mL via INTRAVENOUS

## 2017-09-12 MED ORDER — SODIUM CHLORIDE 0.9% FLUSH
3.0000 mL | INTRAVENOUS | Status: DC | PRN
  Administered 2017-09-12: 3 mL via INTRAVENOUS

## 2017-09-12 NOTE — Progress Notes (Signed)
PT Cancellation Note  Patient Details Name: Shawn Pearson MRN: [id number] DOB: 1941/04/26   Cancelled Treatment:    Reason Eval/Treat Not Completed: Patient declined, no reason specified. Pt refusing participation in therapy. When asked if PT could check back tomorrow, he stated "no, I'm not going to work with you." PT signing off.    Lorriane Shire 09/12/2017, 12:45 PM

## 2017-09-12 NOTE — Progress Notes (Addendum)
PROGRESS NOTE    Shawn Pearson  WVP:[id number] DOB: [birth year] DOA: 09/05/2017 PCP: Mabeline Caras, NP    Brief Narrative: Brief Narrative:  Patient is 76 year old male with dementia, CK D, hypertension, medical noncompliance with medications, negative stress test in February 2018, admitted from skilled nursing facility with chest pain and uncontrolled blood pressure due to noncompliance with medications.  Assessment & Plan:   Principal Problem:   Dementia Active Problems:   Hypertensive urgency   CKD stage 3 secondary to diabetes (HCC)   Chest pain   Noncompliance with medication regimen   Agitation   Elevated troponin  1-Chest pain ; Started  Imdur.  Continue to controlled BP.  Cardiology recommend BP control, and no further invasive test.  I have added IV hydralazine. Patient was refusing to take meds.   Complaint of chest pian this am, he report burning sensation, also pain when he eats. Suspect esophagitis. Start fluconazole he has oral thrush, and Protonix.   Esophagitis;  IV protonix and IV diflucan.   2-HTN Urgency: Refuse to take medications. Continue with  coreg, IV hydralazine, clonidine patch ordered.  Hold Norvasc BP in the 110. Hold lasix and cozaar because he refuse labs.   3-Dementia, suicidal , homicidal ideation.  Reported homicidal ideation to cardiologist on 11-20.Marland Kitchen Psych  consulted again.  Safety sitter at bedside.  Refuse medications.  Evaluated by psych, use restrain as needed if he is risk of harming others.  More clam today.  Increase Depakote to BID>  B 12; 335,  4-DM type 2; On lantus and SSI>   5-CKD stage III; cr range 1.3--1.5 Stable. Hold cozaar.   6-Noncompliance with medication regimen   7-Anemia; Iron deficiency.  Occult blood negative.  Refuse labs .   8-Invasive  rectal cancer.  9-oral thrush; start nystatin.   DVT prophylaxis: Lovenox Code Status: Full code.  Family Communication: none at bedside.  Disposition  Plan: await psych re-evaluation.    Consultants:   Cardiology  Psych   Procedures:  ECHO; Left ventricle: The cavity size was normal. There was moderate   concentric hypertrophy. Systolic function was mildly to   moderately reduced. The estimated ejection fraction was in the   range of 40% to 45%. Hypokineis in the inferoseptal and inferior   myocardium. Features are consistent with a pseudonormal left   ventricular filling pattern, with concomitant abnormal relaxation   and increased filling pressure (grade 2 diastolic dysfunction).   Doppler parameters are consistent with high ventricular filling   pressure. - Aortic valve: Transvalvular velocity was within the normal range.   There was no stenosis. There was no regurgitation. - Mitral valve: Transvalvular velocity was within the normal range.   There was no evidence for stenosis. There was mild regurgitation. - Left atrium: The atrium was moderately dilated. - Right ventricle: The cavity size was normal. Wall thickness was   normal. Systolic function was normal. - Right atrium: The atrium was moderately dilated. - Atrial septum: No defect or patent foramen ovale was identified   by color flow Doppler. - Tricuspid valve: There was mild regurgitation. - Pulmonary arteries: Systolic pressure was within the normal   range. PA peak pressure: 29 mm Hg (S).    Antimicrobials:  none   Subjective: He is complaining of chest pain, burning sensation, worse when he eats.   Objective: Vitals:   09/11/17 1700 09/11/17 1912 09/12/17 0604 09/12/17 0939  BP:  (!) 148/58 (!) 120/51 117/66  Pulse:  76 78 72  Resp: 16 18 18 16   Temp:  98 F (36.7 C) 97.9 F (36.6 C) 98.6 F (37 C)  TempSrc:  Oral Oral Oral  SpO2:  99% 100% 100%  Weight:   91.2 kg (201 lb 1 oz)   Height:        Intake/Output Summary (Last 24 hours) at 09/12/2017 1002 Last data filed at 09/12/2017 0845 Gross per 24 hour  Intake 1859.5 ml  Output 400 ml    Net 1459.5 ml   Filed Weights   09/08/17 0409 09/09/17 0647 09/12/17 0604  Weight: 93.5 kg (206 lb 2.1 oz) 90.6 kg (199 lb 11.8 oz) 91.2 kg (201 lb 1 oz)    Examination:  General; calm, more cooperative Neck supple, no JVD Heart; S 1, S 2 RRR Lungs; CTA Abdomen; Soft, nt, nd BL AKA    Data Reviewed: I have personally reviewed following labs and imaging studies  CBC: Recent Labs  Lab 09/05/17 2210  WBC 7.3  NEUTROABS 4.7  HGB 9.4*  HCT 28.9*  MCV 79.4  PLT 702*   Basic Metabolic Panel: Recent Labs  Lab 09/05/17 2210 09/10/17 1412 09/11/17 1033  NA 134* 134*  --   K 3.3* 4.4  --   CL 105 102  --   CO2 22 23  --   GLUCOSE 167* 164*  --   BUN 14 29*  --   CREATININE 1.46* 1.51*  --   CALCIUM 8.2* 8.5*  --   MG  --   --  2.1   GFR: Estimated Creatinine Clearance: 47.6 mL/min (A) (by C-G formula based on SCr of 1.51 mg/dL (H)). Liver Function Tests: Recent Labs  Lab 09/05/17 2210  AST 18  ALT 8*  ALKPHOS 64  BILITOT 0.4  PROT 6.2*  ALBUMIN 2.4*   No results for input(s): LIPASE, AMYLASE in the last 168 hours. No results for input(s): AMMONIA in the last 168 hours. Coagulation Profile: No results for input(s): INR, PROTIME in the last 168 hours. Cardiac Enzymes: Recent Labs  Lab 09/05/17 2305 09/11/17 1033 09/11/17 1617  TROPONINI 0.12* 0.06* 0.05*   BNP (last 3 results) No results for input(s): PROBNP in the last 8760 hours. HbA1C: No results for input(s): HGBA1C in the last 72 hours. CBG: Recent Labs  Lab 09/10/17 1653 09/10/17 2205 09/11/17 0911 09/11/17 2057 09/12/17 0757  GLUCAP 153* 180* 179* 315* 231*   Lipid Profile: No results for input(s): CHOL, HDL, LDLCALC, TRIG, CHOLHDL, LDLDIRECT in the last 72 hours. Thyroid Function Tests: No results for input(s): TSH, T4TOTAL, FREET4, T3FREE, THYROIDAB in the last 72 hours. Anemia Panel: No results for input(s): VITAMINB12, FOLATE, FERRITIN, TIBC, IRON, RETICCTPCT in the last 72  hours. Sepsis Labs: No results for input(s): PROCALCITON, LATICACIDVEN in the last 168 hours.  No results found for this or any previous visit (from the past 240 hour(s)).       Radiology Studies: No results found.      Scheduled Meds: . aspirin EC  325 mg Oral Daily  . atorvastatin  40 mg Oral QHS  . carvedilol  12.5 mg Oral BID WC  . cloNIDine  0.1 mg Transdermal Weekly  . enoxaparin (LOVENOX) injection  40 mg Subcutaneous Daily  . feeding supplement (ENSURE ENLIVE)  237 mL Oral TID BM  . finasteride  5 mg Oral Daily  . insulin aspart  0-9 Units Subcutaneous TID WC  . insulin glargine  6 Units Subcutaneous QHS  . isosorbide mononitrate  30 mg  Oral Daily  . Melatonin  3 mg Oral QHS  . nystatin  5 mL Oral QID  . pantoprazole (PROTONIX) IV  40 mg Intravenous Q12H  . polyethylene glycol  17 g Oral Daily  . tamsulosin  0.4 mg Oral Daily  . traMADol  50 mg Oral Once   Continuous Infusions: . fluconazole (DIFLUCAN) IV    . valproate sodium Stopped (09/12/17 0622)     LOS: 6 days    Time spent: 35 minutes.     Elmarie Shiley, MD [facility] Pager [phone]  If 7PM-7AM, please contact night-coverage [email or web address] Password [email or web address] 09/12/2017, 10:02 AM

## 2017-09-12 NOTE — Progress Notes (Signed)
OT Cancellation Note  Patient Details Name: Shawn Pearson MRN: [id number] DOB: July 23, 1941   Cancelled Treatment:    Reason Eval/Treat Not Completed: OT screened, no needs identified, will sign off. No acute OT needs. Pt is self feeding per sitter. He is dependent in bathing and dressing at baseline at his SNF. Will defer further OT to SNF's discretion.  Malka So 09/12/2017, 10:38 AM  09/12/2017 Nestor Lewandowsky, OTR/L Pager: [phone]

## 2017-09-12 NOTE — Progress Notes (Signed)
Patient has refused all p.o. Medications for this nurse throughout the day.  Also refused Lovenox injection.  Is not aggressive, but is very assertive with his refusals.  Several attempts have proved futile.  Sleeping throughout shift, waking only to eat.  When approach by staff, he closes his eyes and looks away, stating, "No, leave me alone".  Will not answer questions.  Stated to MD earlier that he was experiencing chest pain and arm pain, but when approached re:  Taking medication for pain, he denies pain to me, stating he "just wants to be left alone".  Very adamant re:  Not working with therapies or taking medications.  This reported to MD.

## 2017-09-13 ENCOUNTER — Inpatient Hospital Stay (HOSPITAL_COMMUNITY): Payer: Medicare Other

## 2017-09-13 LAB — GLUCOSE, CAPILLARY
Glucose-Capillary: 204 mg/dL — ABNORMAL HIGH (ref 65–99)
Glucose-Capillary: 208 mg/dL — ABNORMAL HIGH (ref 65–99)

## 2017-09-13 MED ORDER — AMLODIPINE BESYLATE 5 MG PO TABS
5.0000 mg | ORAL_TABLET | Freq: Every day | ORAL | Status: DC
  Administered 2017-09-13 – 2017-09-15 (×2): 5 mg via ORAL
  Filled 2017-09-13 (×3): qty 1

## 2017-09-13 NOTE — Progress Notes (Signed)
Pt refused am blood work.  Did allow NT Freight forwarder) and RN change when wet and had BM but did swat at caregivers while turning.

## 2017-09-13 NOTE — Progress Notes (Signed)
Patient has refused CBG monitoring during the day.

## 2017-09-13 NOTE — Progress Notes (Signed)
Pt reports feeling constipated despite having multiple bowel movements today. Per sitter, BMs have been small and soft. Miralax given.

## 2017-09-13 NOTE — Progress Notes (Signed)
PROGRESS NOTE    Shawn Pearson  YOV:[id number] DOB: 21-Feb-1941 DOA: 09/05/2017 PCP: Mabeline Caras, NP    Brief Narrative: Brief Narrative:  Patient is 76 year old male with dementia, CK D, hypertension, medical noncompliance with medications, negative stress test in February 2018, admitted from skilled nursing facility with chest pain and uncontrolled blood pressure due to noncompliance with medications.  Assessment & Plan:   Principal Problem:   Dementia Active Problems:   Hypertensive urgency   CKD stage 3 secondary to diabetes (HCC)   Chest pain   Noncompliance with medication regimen   Agitation   Elevated troponin  1-Chest pain ; Started  Imdur.  Continue to controlled BP.  Cardiology recommend BP control, and no further invasive test.   IV hydralazine. Patient was refusing to take meds.   Complaint of chest pian this am, he report burning sensation, also pain when he eats. Suspect esophagitis. Started  fluconazole he has oral thrush, and Protonix.   Esophagitis;  IV protonix and IV diflucan.   2-HTN Urgency: Refuse to take medications. Continue with  coreg, IV hydralazine, clonidine patch ordered.  Resume norvasc.   3-Dementia, suicidal , homicidal ideation.  Reported homicidal ideation to cardiologist on 11-20.Marland Kitchen Psych  consulted again.  Safety sitter at bedside.  Refuse medications.  Evaluated by psych, use restrain as needed if he is risk of harming others.  More clam today.  Increase Depakote to BID>  B 12; 335,  He is more cooperative today   4-DM type 2; On lantus and SSI>   5-CKD stage III; cr range 1.3--1.5 Stable. Hold cozaar.   6-Noncompliance with medication regimen   7-Anemia; Iron deficiency.  Occult blood negative.  Refuse labs .   8-Invasive  rectal cancer.  9-oral thrush; start nystatin.  10-abdominal pain; check KUB DVT prophylaxis: Lovenox Code Status: Full code.  Family Communication: none at bedside.  Disposition Plan:  await psych re-evaluation.    Consultants:   Cardiology  Psych   Procedures:  ECHO; Left ventricle: The cavity size was normal. There was moderate   concentric hypertrophy. Systolic function was mildly to   moderately reduced. The estimated ejection fraction was in the   range of 40% to 45%. Hypokineis in the inferoseptal and inferior   myocardium. Features are consistent with a pseudonormal left   ventricular filling pattern, with concomitant abnormal relaxation   and increased filling pressure (grade 2 diastolic dysfunction).   Doppler parameters are consistent with high ventricular filling   pressure. - Aortic valve: Transvalvular velocity was within the normal range.   There was no stenosis. There was no regurgitation. - Mitral valve: Transvalvular velocity was within the normal range.   There was no evidence for stenosis. There was mild regurgitation. - Left atrium: The atrium was moderately dilated. - Right ventricle: The cavity size was normal. Wall thickness was   normal. Systolic function was normal. - Right atrium: The atrium was moderately dilated. - Atrial septum: No defect or patent foramen ovale was identified   by color flow Doppler. - Tricuspid valve: There was mild regurgitation. - Pulmonary arteries: Systolic pressure was within the normal   range. PA peak pressure: 29 mm Hg (S).    Antimicrobials:  none   Subjective: Complaints of chest pain, burning pain.  He is also complaining of abdominal pain.   Objective: Vitals:   09/12/17 0939 09/13/17 0654 09/13/17 0755 09/13/17 1136  BP: 117/66 (!) 185/59 (!) 180/68 (!) 143/65  Pulse: 72 79 71 72  Resp: 16 18    Temp: 98.6 F (37 C) 98.2 F (36.8 C)  98 F (36.7 C)  TempSrc: Oral Oral  Oral  SpO2: 100% 98%  99%  Weight:  92.4 kg (203 lb 11.3 oz)    Height:        Intake/Output Summary (Last 24 hours) at 09/13/2017 1406 Last data filed at 09/13/2017 1239 Gross per 24 hour  Intake 1481.25 ml    Output 1400 ml  Net 81.25 ml   Filed Weights   09/09/17 0647 09/12/17 0604 09/13/17 0654  Weight: 90.6 kg (199 lb 11.8 oz) 91.2 kg (201 lb 1 oz) 92.4 kg (203 lb 11.3 oz)    Examination:  General; calm, NAD Neck supple, No JVD Heart;  S 1, S 2 RRR Lungs; CTA Abdomen; Soft, nt, nd BL AKA    Data Reviewed: I have personally reviewed following labs and imaging studies  CBC: No results for input(s): WBC, NEUTROABS, HGB, HCT, MCV, PLT in the last 168 hours. Basic Metabolic Panel: Recent Labs  Lab 09/10/17 1412 09/11/17 1033  NA 134*  --   K 4.4  --   CL 102  --   CO2 23  --   GLUCOSE 164*  --   BUN 29*  --   CREATININE 1.51*  --   CALCIUM 8.5*  --   MG  --  2.1   GFR: Estimated Creatinine Clearance: 47.6 mL/min (A) (by C-G formula based on SCr of 1.51 mg/dL (H)). Liver Function Tests: No results for input(s): AST, ALT, ALKPHOS, BILITOT, PROT, ALBUMIN in the last 168 hours. No results for input(s): LIPASE, AMYLASE in the last 168 hours. No results for input(s): AMMONIA in the last 168 hours. Coagulation Profile: No results for input(s): INR, PROTIME in the last 168 hours. Cardiac Enzymes: Recent Labs  Lab 09/11/17 1033 09/11/17 1617  TROPONINI 0.06* 0.05*   BNP (last 3 results) No results for input(s): PROBNP in the last 8760 hours. HbA1C: No results for input(s): HGBA1C in the last 72 hours. CBG: Recent Labs  Lab 09/11/17 0911 09/11/17 2057 09/12/17 0757 09/13/17 0335 09/13/17 0651  GLUCAP 179* 315* 231* 204* 208*   Lipid Profile: No results for input(s): CHOL, HDL, LDLCALC, TRIG, CHOLHDL, LDLDIRECT in the last 72 hours. Thyroid Function Tests: No results for input(s): TSH, T4TOTAL, FREET4, T3FREE, THYROIDAB in the last 72 hours. Anemia Panel: No results for input(s): VITAMINB12, FOLATE, FERRITIN, TIBC, IRON, RETICCTPCT in the last 72 hours. Sepsis Labs: No results for input(s): PROCALCITON, LATICACIDVEN in the last 168 hours.  No results found  for this or any previous visit (from the past 240 hour(s)).       Radiology Studies: No results found.      Scheduled Meds: . amLODipine  5 mg Oral Daily  . aspirin EC  325 mg Oral Daily  . atorvastatin  40 mg Oral QHS  . carvedilol  12.5 mg Oral BID WC  . cloNIDine  0.1 mg Transdermal Weekly  . enoxaparin (LOVENOX) injection  40 mg Subcutaneous Daily  . feeding supplement (ENSURE ENLIVE)  237 mL Oral TID BM  . finasteride  5 mg Oral Daily  . insulin aspart  0-9 Units Subcutaneous TID WC  . insulin glargine  6 Units Subcutaneous QHS  . isosorbide mononitrate  30 mg Oral Daily  . Melatonin  3 mg Oral QHS  . nystatin  5 mL Oral QID  . pantoprazole (PROTONIX) IV  40 mg Intravenous Q12H  .  polyethylene glycol  17 g Oral Daily  . sodium chloride flush  3 mL Intravenous Q12H  . tamsulosin  0.4 mg Oral Daily  . traMADol  50 mg Oral Once   Continuous Infusions: . fluconazole (DIFLUCAN) IV Stopped (09/13/17 1236)  . valproate sodium Stopped (09/12/17 1700)     LOS: 7 days    Time spent: 35 minutes.     Elmarie Shiley, MD [facility] Pager [phone]  If 7PM-7AM, please contact night-coverage [email or web address] Password [email or web address] 09/13/2017, 2:06 PM

## 2017-09-13 NOTE — Progress Notes (Signed)
Pt took some PO meds with ensure.  Refused to have CBG taken and refused bedtime Lantus.  Pt will not allow Nt or RN to take vital signs.  Stated "I said No!"  Explained to patient the importance of all meds and procedures still would not allow [place] to perform needed tasks. Will continue to monitor.

## 2017-09-13 NOTE — Progress Notes (Addendum)
Patient's SBP in the 180's. Convinced pt to at least take blood pressure meds. Pt refusing other medications. Will reassess BP and give PRN hydralazine if needed.

## 2017-09-13 NOTE — Plan of Care (Signed)
  Not Progressing Coping: Level of anxiety will decrease 09/13/2017 0356 - Not Progressing by Ardine Eng, RN

## 2017-09-13 NOTE — Progress Notes (Signed)
SBP now in 140's. Pt remains calm and cooperative at this time.

## 2017-09-13 NOTE — Progress Notes (Signed)
Text page sent through [place] to on call MD concerning patients elevated CBG and led pain, no orders received.

## 2017-09-14 LAB — BASIC METABOLIC PANEL
Anion gap: 6 (ref 5–15)
BUN: 42 mg/dL — AB (ref 6–20)
CHLORIDE: 101 mmol/L (ref 101–111)
CO2: 26 mmol/L (ref 22–32)
CREATININE: 1.64 mg/dL — AB (ref 0.61–1.24)
Calcium: 8.1 mg/dL — ABNORMAL LOW (ref 8.9–10.3)
GFR calc Af Amer: 45 mL/min — ABNORMAL LOW (ref >60)
GFR calc non Af Amer: 39 mL/min — ABNORMAL LOW (ref >60)
Glucose, Bld: 206 mg/dL — ABNORMAL HIGH (ref 65–99)
Potassium: 4 mmol/L (ref 3.5–5.1)
Sodium: 133 mmol/L — ABNORMAL LOW (ref 135–145)

## 2017-09-14 LAB — GLUCOSE, CAPILLARY
Glucose-Capillary: 163 mg/dL — ABNORMAL HIGH (ref 65–99)
Glucose-Capillary: 156 mg/dL — ABNORMAL HIGH (ref 65–99)

## 2017-09-14 LAB — CBC
HEMATOCRIT: 26.5 % — AB (ref 39.0–52.0)
HEMOGLOBIN: 8.7 g/dL — AB (ref 13.0–17.0)
MCH: 26.1 pg (ref 26.0–34.0)
MCHC: 32.8 g/dL (ref 30.0–36.0)
MCV: 79.6 fL (ref 78.0–100.0)
Platelets: 303 10*3/uL (ref 150–400)
RBC: 3.33 MIL/uL — ABNORMAL LOW (ref 4.22–5.81)
RDW: 15.3 % (ref 11.5–15.5)
WBC: 8.2 10*3/uL (ref 4.0–10.5)

## 2017-09-14 MED ORDER — ENSURE ENLIVE PO LIQD
237.0000 mL | Freq: Four times a day (QID) | ORAL | Status: DC
  Administered 2017-09-14 – 2017-09-21 (×19): 237 mL via ORAL

## 2017-09-14 MED ORDER — TRAMADOL HCL 50 MG PO TABS
50.0000 mg | ORAL_TABLET | Freq: Three times a day (TID) | ORAL | Status: DC | PRN
  Administered 2017-09-14 – 2017-09-27 (×12): 50 mg via ORAL
  Filled 2017-09-14 (×14): qty 1

## 2017-09-14 MED ORDER — FLEET ENEMA 7-19 GM/118ML RE ENEM
1.0000 | ENEMA | Freq: Once | RECTAL | Status: AC
Start: 2017-09-14 — End: 2017-09-14
  Administered 2017-09-14: 1 via RECTAL
  Filled 2017-09-14: qty 1

## 2017-09-14 NOTE — Progress Notes (Signed)
Nutrition Follow-up  DOCUMENTATION CODES:   Obesity unspecified  INTERVENTION:   -Increase Ensure Enlive po to QID, each supplement provides 350 kcal and 20 grams of protein  NUTRITION DIAGNOSIS:   Inadequate oral intake related to lethargy/confusion as evidenced by meal completion < 25%.  Ongoing  GOAL:   Patient will meet greater than or equal to 90% of their needs  Progressing  MONITOR:   PO intake, Supplement acceptance, Labs, Weight trends, Skin, I & O's  REASON FOR ASSESSMENT:   Malnutrition Screening Tool    ASSESSMENT:   Patient is a 76 year old male with past medical history significant for dementia, CKD, HTN , medical noncompliance wth meds, neg stress test in 2/18. Patient was admitted from SNF with chest pain, uncontrolled blood pressure due to non compliance with medication and headache.  He says he does not like the nursing home he is at and that is why he refuses to take his meds. The patient has been non co operative with the Nursing staff, and still refusing his medication. Psych has been consulted as patient likely has dementia with behavioral problems.   11/21- initial psych consult completed   Per chart review, pt has been abusive to staff. He often refused medications and most cares. Meal completion is variable (Po: 0-100%); pt averaging around 34% meal completion. He is consuming Ensure supplements. He is currently consuming TID, which provides 1050 kcals and 60 grams of protein (64% of estimated nutritional needs and 75% of estimated protein needs).   Pt remains poor oral intake and would continue to benefit from nutrient dense supplement. One Ensure Enlive supplement provides 350 kcals, 20 grams protein, and 44-45 grams of carbohydrate vs one Glucerna shake supplement, which provides 220 kcals, 10 grams of protein, and 26 grams of carbohydrate. Given pt's hx of DM, RD will continue to monitor PO intake, CBGS, and adjust supplement regimen as appropriate.    Labs reviewed: Na: 133, CBGS: 163-208 (inpatient orders for glycemic control are 0-9 units insulin aspart TID with meals and 6 units insulin glargine q HS)  Diet Order:  Diet Heart Room service appropriate? Yes; Fluid consistency: Thin  EDUCATION NEEDS:   Not appropriate for education at this time  Skin:  Skin Assessment: Reviewed RN Assessment  Last BM:  09/14/17  Height:   Ht Readings from Last 1 Encounters:  09/06/17 6\' 2"  (1.88 m)    Weight:   Wt Readings from Last 1 Encounters:  09/13/17 203 lb 11.3 oz (92.4 kg)    Ideal Body Weight:  75.1 kg  BMI:  Body mass index is 26.15 kg/m.  Estimated Nutritional Needs:   Kcal:  1650-1850  Protein:  80-95 grams  Fluid:  1.6-1.8 L    Sherryll Skoczylas A. Jimmye Norman, RD, LDN, CDE Pager: [phone] After hours Pager: [phone]

## 2017-09-14 NOTE — Progress Notes (Signed)
PROGRESS NOTE    Shawn Pearson  YQM:[id number] DOB: 09/06/1941 DOA: 09/05/2017 PCP: Mabeline Caras, NP    Brief Narrative: Brief Narrative:  Patient is 76 year old male with dementia, CK D, hypertension, medical noncompliance with medications, negative stress test in February 2018, admitted from skilled nursing facility with chest pain and uncontrolled blood pressure due to noncompliance with medications.  Assessment & Plan:   Principal Problem:   Dementia Active Problems:   Hypertensive urgency   CKD stage 3 secondary to diabetes (HCC)   Chest pain   Noncompliance with medication regimen   Agitation   Elevated troponin  1-Chest pain ; likely related to esophagitis.  Started  Imdur.  Continue to controlled BP.  Cardiology recommend BP control, and no further invasive test.   IV hydralazine. Patient was refusing to take meds.   Complaint of chest pian this am, he report burning sensation, also pain when he eats. Suspect esophagitis. Started  fluconazole he has oral thrush, and Protonix.  He report improvement of chest pain.   Esophagitis;  IV protonix and IV diflucan.   2-HTN Urgency: Refuse to take medications. Continue with  coreg, IV hydralazine, clonidine patch ordered.  Continue with norvasc.   3-Dementia, suicidal , homicidal ideation.  Reported homicidal ideation to cardiologist on 11-20.Marland Kitchen Psych  consulted again.  Safety sitter at bedside.  Refuse medications.  Evaluated by psych, use restrain as needed if he is risk of harming others.  Increase Depakote to BID>  B 12; 335,  Less cooperative today, refusing medications.   4-DM type 2; On lantus and SSI>   5-CKD stage III; cr range 1.3--1.5 Stable. Holding cozaar.  Monitor closely.   6-Noncompliance with medication regimen   7-Anemia; Iron deficiency.  Occult blood negative.  Refuse labs .   8-Invasive  rectal cancer.  9-oral thrush; start nystatin.   10-Abdominal pain; KUB with constipation.   Fleet enema ordered.   11-stump pain. Start tramadol PRN  DVT prophylaxis: Lovenox Code Status: Full code.  Family Communication: none at bedside.  Disposition Plan: await psych re-evaluation.    Consultants:   Cardiology  Psych   Procedures:  ECHO; Left ventricle: The cavity size was normal. There was moderate   concentric hypertrophy. Systolic function was mildly to   moderately reduced. The estimated ejection fraction was in the   range of 40% to 45%. Hypokineis in the inferoseptal and inferior   myocardium. Features are consistent with a pseudonormal left   ventricular filling pattern, with concomitant abnormal relaxation   and increased filling pressure (grade 2 diastolic dysfunction).   Doppler parameters are consistent with high ventricular filling   pressure. - Aortic valve: Transvalvular velocity was within the normal range.   There was no stenosis. There was no regurgitation. - Mitral valve: Transvalvular velocity was within the normal range.   There was no evidence for stenosis. There was mild regurgitation. - Left atrium: The atrium was moderately dilated. - Right ventricle: The cavity size was normal. Wall thickness was   normal. Systolic function was normal. - Right atrium: The atrium was moderately dilated. - Atrial septum: No defect or patent foramen ovale was identified   by color flow Doppler. - Tricuspid valve: There was mild regurgitation. - Pulmonary arteries: Systolic pressure was within the normal   range. PA peak pressure: 29 mm Hg (S).    Antimicrobials:  none   Subjective: He is less cooperative today, refuse medications.  He is still complaining of abdominal pain, agree to get  enema.  Chest pain is better.   Objective: Vitals:   09/13/17 1136 09/13/17 2021 09/13/17 2155 09/14/17 0657  BP: (!) 143/65 (!) 160/67 (!) 154/54 (!) 166/64  Pulse: 72 74  71  Resp:  18  18  Temp: 98 F (36.7 C) 98 F (36.7 C)  98 F (36.7 C)  TempSrc:  Oral Oral  Oral  SpO2: 99% 99%  98%  Weight:      Height:        Intake/Output Summary (Last 24 hours) at 09/14/2017 1344 Last data filed at 09/14/2017 1000 Gross per 24 hour  Intake 1112.5 ml  Output 950 ml  Net 162.5 ml   Filed Weights   09/09/17 0647 09/12/17 0604 09/13/17 0654  Weight: 90.6 kg (199 lb 11.8 oz) 91.2 kg (201 lb 1 oz) 92.4 kg (203 lb 11.3 oz)    Examination:  General; Calm, NAD Neck supple, No JVD Heart;  S 1, S 2 RRR Lungs; CTA Abdomen; Soft, NT, ND BL AKA    Data Reviewed: I have personally reviewed following labs and imaging studies  CBC: Recent Labs  Lab 09/14/17 0453  WBC 8.2  HGB 8.7*  HCT 26.5*  MCV 79.6  PLT 628   Basic Metabolic Panel: Recent Labs  Lab 09/10/17 1412 09/11/17 1033 09/14/17 0453  NA 134*  --  133*  K 4.4  --  4.0  CL 102  --  101  CO2 23  --  26  GLUCOSE 164*  --  206*  BUN 29*  --  42*  CREATININE 1.51*  --  1.64*  CALCIUM 8.5*  --  8.1*  MG  --  2.1  --    GFR: Estimated Creatinine Clearance: 43.9 mL/min (A) (by C-G formula based on SCr of 1.64 mg/dL (H)). Liver Function Tests: No results for input(s): AST, ALT, ALKPHOS, BILITOT, PROT, ALBUMIN in the last 168 hours. No results for input(s): LIPASE, AMYLASE in the last 168 hours. No results for input(s): AMMONIA in the last 168 hours. Coagulation Profile: No results for input(s): INR, PROTIME in the last 168 hours. Cardiac Enzymes: Recent Labs  Lab 09/11/17 1033 09/11/17 1617  TROPONINI 0.06* 0.05*   BNP (last 3 results) No results for input(s): PROBNP in the last 8760 hours. HbA1C: No results for input(s): HGBA1C in the last 72 hours. CBG: Recent Labs  Lab 09/11/17 2057 09/12/17 0757 09/13/17 0335 09/13/17 0651 09/14/17 0658  GLUCAP 315* 231* 204* 208* 163*   Lipid Profile: No results for input(s): CHOL, HDL, LDLCALC, TRIG, CHOLHDL, LDLDIRECT in the last 72 hours. Thyroid Function Tests: No results for input(s): TSH, T4TOTAL, FREET4,  T3FREE, THYROIDAB in the last 72 hours. Anemia Panel: No results for input(s): VITAMINB12, FOLATE, FERRITIN, TIBC, IRON, RETICCTPCT in the last 72 hours. Sepsis Labs: No results for input(s): PROCALCITON, LATICACIDVEN in the last 168 hours.  No results found for this or any previous visit (from the past 240 hour(s)).       Radiology Studies: Dg Abd Portable 1v  Result Date: 09/13/2017 CLINICAL DATA:  76 y/o  M; abdominal pain. EXAM: PORTABLE ABDOMEN - 1 VIEW COMPARISON:  None. FINDINGS: Normal bowel gas pattern. Moderate volume of stool throughout the colon. Round 20 mm calcification in left upper quadrant projecting left lateral to T12 vertebral body, possibly a calcified aneurysm or kidney stone. IMPRESSION: Normal bowel gas pattern. Moderate volume of stool throughout colon. Round 2 cm calcification in left upper quadrant, possibly calcified aneurysm or kidney stone.  Electronically Signed   By: Kristine Garbe M.D.   On: 09/13/2017 18:56        Scheduled Meds: . amLODipine  5 mg Oral Daily  . aspirin EC  325 mg Oral Daily  . atorvastatin  40 mg Oral QHS  . carvedilol  12.5 mg Oral BID WC  . cloNIDine  0.1 mg Transdermal Weekly  . enoxaparin (LOVENOX) injection  40 mg Subcutaneous Daily  . feeding supplement (ENSURE ENLIVE)  237 mL Oral TID BM  . finasteride  5 mg Oral Daily  . insulin aspart  0-9 Units Subcutaneous TID WC  . insulin glargine  6 Units Subcutaneous QHS  . isosorbide mononitrate  30 mg Oral Daily  . Melatonin  3 mg Oral QHS  . nystatin  5 mL Oral QID  . pantoprazole (PROTONIX) IV  40 mg Intravenous Q12H  . polyethylene glycol  17 g Oral Daily  . sodium chloride flush  3 mL Intravenous Q12H  . sodium phosphate  1 enema Rectal Once  . tamsulosin  0.4 mg Oral Daily  . traMADol  50 mg Oral Once   Continuous Infusions: . fluconazole (DIFLUCAN) IV Stopped (09/13/17 1236)  . valproate sodium 125 mg (09/14/17 0317)     LOS: 8 days    Time spent:  35 minutes.     Elmarie Shiley, MD [facility] Pager [phone]  If 7PM-7AM, please contact night-coverage [email or web address] Password [email or web address] 09/14/2017, 1:44 PM

## 2017-09-14 NOTE — Clinical Social Work Note (Signed)
Clinical Social Work Assessment  Patient Details  Name: Shawn Pearson MRN: [id number] Date of Birth: 05-Sep-1941  Date of referral:  09/14/17               Reason for consult:  Facility Placement, Discharge Planning                Permission sought to share information with:  Facility Sport and exercise psychologist, Family Supports Permission granted to share information::  Yes, Verbal Permission Granted  Name::     Grahm Etsitty  Agency::  SNF's  Relationship::  Wife  Contact Information:  [phone]  Housing/Transportation Living arrangements for the past 2 months:  [place], [place] of Information:  Patient, Medical Team, Spouse Patient Interpreter Needed:  None Criminal Activity/Legal Involvement Pertinent to Current Situation/Hospitalization:  No - Comment as needed Significant Relationships:  Spouse Lives with:  Facility Resident Do you feel safe going back to the place where you live?  Yes Need for family participation in patient care:  Yes (Comment)  Care giving concerns:  Patient was admitted from [facility].   Social Worker assessment / plan:  CSW met with patient. Sitter at bedside. CSW introduced role and explained that discharge planning would be discussed. Patient stated that he wants his clothes from [facility]. CSW asked hospital liaison for [facility]. She will reach out to the facility and see if they ever went to get his clothing. 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 Patient states that he plans to return home when discharged. CSW asked if his wife was okay with that. He gave CSW permission to call her. He stated he last spoke with her yesterday. CSW called and spoke with patient's wife who stated that it is not possible for patient to return home. Patient's wife stated that he is on the wait list at [facility] called to confirm but they said he is not on the list. Patient's wife states she just  had a heart attack and is still recovering. She works five days per week and there are small children in the home and she is concerned about their safety if he comes back. Patient's wife states that the patient has been in facilities since 2015 but they never want him back "because of his attitude." CSW notified patient that he cannot return home. He is agreeable to CSW sending out referrals. CSW will send out referral once combative behaviors have calmed down. Per sitter, he is combative today and did not want CSW getting close to him. No further concerns. CSW encouraged patient and his wife to contact CSW as needed. CSW will continue to follow patient and his wife for support and facilitate discharge to SNF once behaviors have calmed down.   Employment status:  Retired Forensic scientist:  Information systems manager, [employer] [place] PT Recommendations:  Not assessed at this time Information / Referral to community resources:  [place]  Patient/Family's Response to care:  Patient and his wife agreeable to SNF placement. Patient's wife is answering phone now. Patient and his wife appreciated social work intervention.  Patient/Family's Understanding of and Emotional Response to Diagnosis, Current Treatment, and Prognosis:  Patient and his wife have a good understanding of the reason for admission and his need for SNF once discharged. Patient is not happy with hospital care as evidenced by his agressiveness, both verbally and physically.  Emotional Assessment Appearance:  Appears stated age Attitude/Demeanor/Rapport:  Guarded Affect (typically observed):  Accepting, Calm Orientation:  Oriented to Self, Oriented to Place, Oriented to  Time, Oriented to Situation Alcohol / Substance use:  Never Used Psych involvement (Current and /or in the community):  No (Comment)  Discharge Needs  Concerns to be addressed:  Care Coordination Readmission within the last 30 days:  Yes Current discharge risk:   Dependent with Mobility Barriers to Discharge:  Continued Medical Work up, Requiring sitter/restraints   Candie Chroman, LCSW 09/14/2017, 1:04 PM

## 2017-09-14 NOTE — Care Management Important Message (Signed)
Important Message  Patient Details  Name: Shawn Pearson MRN: [id number] Date of Birth: 08/31/1941   Medicare Important Message Given:  Yes    [name] May 09/14/2017, 9:38 AM

## 2017-09-14 NOTE — Progress Notes (Signed)
Fleets enema and tramadol given (for leg pain).

## 2017-09-14 NOTE — Progress Notes (Signed)
NT and sitter tried to obtain CBG but pt attempted to hit sitter.

## 2017-09-14 NOTE — Plan of Care (Signed)
  Not Met (add Reason) Clinical Measurements: Diagnostic test results will improve 09/14/2017 0018 - Not Met (add Reason) by Ardine Eng, RN

## 2017-09-14 NOTE — Progress Notes (Signed)
Patient refuses to take medications. States "throw them in the trash and go to hell."

## 2017-09-15 ENCOUNTER — Encounter (HOSPITAL_COMMUNITY): Payer: Self-pay

## 2017-09-15 LAB — GLUCOSE, CAPILLARY
GLUCOSE-CAPILLARY: 319 mg/dL — AB (ref 65–99)
GLUCOSE-CAPILLARY: 230 mg/dL — AB (ref 65–99)
Glucose-Capillary: 384 mg/dL — ABNORMAL HIGH (ref 65–99)

## 2017-09-15 MED ORDER — LACTULOSE 10 GM/15ML PO SOLN
30.0000 g | Freq: Two times a day (BID) | ORAL | Status: DC
  Administered 2017-09-15 – 2017-09-28 (×22): 30 g via ORAL
  Filled 2017-09-15 (×24): qty 45

## 2017-09-15 NOTE — Progress Notes (Signed)
Pt changed his mind and allowed me to check his blood glucose and take BP. Notified Dr.

## 2017-09-15 NOTE — Progress Notes (Signed)
Pt refusing blood glucose check, vital signs and medications. Notified Dr.

## 2017-09-15 NOTE — Plan of Care (Signed)
  Nutrition: Adequate nutrition will be maintained 09/15/2017 2240 - Progressing by Amory Simonetti, Roma Kayser, RN

## 2017-09-15 NOTE — Progress Notes (Signed)
Pt refusing blood glucose check again. Notified Dr.

## 2017-09-15 NOTE — Progress Notes (Signed)
Pt refusing blood glucose check.  Notified Dr.

## 2017-09-15 NOTE — Progress Notes (Signed)
PROGRESS NOTE    Shawn Pearson  OEU:[id number] DOB: July 06, 1941 DOA: 09/05/2017 PCP: Mabeline Caras, NP    Brief Narrative: Brief Narrative:  Patient is 76 year old male with dementia, CK D, hypertension, medical noncompliance with medications, negative stress test in February 2018, admitted from skilled nursing facility with chest pain and uncontrolled blood pressure due to noncompliance with medications. He also report suicidal , homicidal thought. He was evaluated by psych. He was started on Depakote IV. Unable to use haldol due to prolong QT. He continue to refuse medication at times. Psych is recommending inpatient geropsychiatry facility.   Assessment & Plan:   Principal Problem:   Dementia Active Problems:   Hypertensive urgency   CKD stage 3 secondary to diabetes (HCC)   Chest pain   Noncompliance with medication regimen   Agitation   Elevated troponin  1-Chest pain ; likely related to esophagitis.  Started  Imdur.  Continue to controlled BP.  Cardiology recommend BP control, and no further invasive test.   IV hydralazine. Patient was refusing to take meds.   Complaint of chest pian this am, he report burning sensation, also pain when he eats. Suspect esophagitis. Started  fluconazole he has oral thrush, and Protonix.  He report improvement of chest pain.   Esophagitis;  IV protonix and IV diflucan for presume esophagitis.   2-HTN Urgency: Refuse to take medications. Continue with  coreg, IV hydralazine, clonidine patch ordered.  Continue with norvasc.   3-Dementia, suicidal , homicidal ideation.  Reported homicidal ideation to cardiologist on 11-20.Marland Kitchen Psych  consulted again.  Safety sitter at bedside.  Refuse medications.  Evaluated by psych, use restrain as needed if he is risk of harming others.  Increase Depakote to BID>  B 12; 335,  More cooperative today   4-DM type 2; On lantus and SSI>   5-CKD stage III; cr range 1.3--1.5 Stable. Holding cozaar.    Monitor closely.   6-Noncompliance with medication regimen   7-Anemia; Iron deficiency.  Occult blood negative.  Refuse labs .   8-Invasive  rectal cancer.  9-oral thrush; start nystatin.   10-Abdominal pain; KUB with constipation.  Had small BM after fleet enema. Start lactulose   11-stump pain. Start tramadol PRN  DVT prophylaxis: Lovenox Code Status: Full code.  Family Communication: none at bedside.  Disposition Plan: await psych re-evaluation.    Consultants:   Cardiology  Psych   Procedures:  ECHO; Left ventricle: The cavity size was normal. There was moderate   concentric hypertrophy. Systolic function was mildly to   moderately reduced. The estimated ejection fraction was in the   range of 40% to 45%. Hypokineis in the inferoseptal and inferior   myocardium. Features are consistent with a pseudonormal left   ventricular filling pattern, with concomitant abnormal relaxation   and increased filling pressure (grade 2 diastolic dysfunction).   Doppler parameters are consistent with high ventricular filling   pressure. - Aortic valve: Transvalvular velocity was within the normal range.   There was no stenosis. There was no regurgitation. - Mitral valve: Transvalvular velocity was within the normal range.   There was no evidence for stenosis. There was mild regurgitation. - Left atrium: The atrium was moderately dilated. - Right ventricle: The cavity size was normal. Wall thickness was   normal. Systolic function was normal. - Right atrium: The atrium was moderately dilated. - Atrial septum: No defect or patent foramen ovale was identified   by color flow Doppler. - Tricuspid valve: There was mild  regurgitation. - Pulmonary arteries: Systolic pressure was within the normal   range. PA peak pressure: 29 mm Hg (S).    Antimicrobials:  none   Subjective: He report improvement of chest pain.  Still with abdominal pain, feels need to have BM. He is willing  to take laxative.   Objective: Vitals:   09/15/17 0205 09/15/17 0533 09/15/17 0535 09/15/17 0935  BP: (!) 163/67  (!) 183/68 (!) 186/73  Pulse: 64  68 84  Resp: 13  16   Temp: 97.6 F (36.4 C)     TempSrc: Oral     SpO2: 99%  96% 97%  Weight:  93.2 kg (205 lb 7.5 oz)    Height:        Intake/Output Summary (Last 24 hours) at 09/15/2017 1720 Last data filed at 09/15/2017 1359 Gross per 24 hour  Intake 453 ml  Output 1850 ml  Net -1397 ml   Filed Weights   09/12/17 0604 09/13/17 0654 09/15/17 0533  Weight: 91.2 kg (201 lb 1 oz) 92.4 kg (203 lb 11.3 oz) 93.2 kg (205 lb 7.5 oz)    Examination:  General; NAD Neck supple, No JVD Heart;  S 1, S 2 RRR Lungs; CTA Abdomen; soft, nt mild distended BL AKA    Data Reviewed: I have personally reviewed following labs and imaging studies  CBC: Recent Labs  Lab 09/14/17 0453  WBC 8.2  HGB 8.7*  HCT 26.5*  MCV 79.6  PLT 151   Basic Metabolic Panel: Recent Labs  Lab 09/10/17 1412 09/11/17 1033 09/14/17 0453  NA 134*  --  133*  K 4.4  --  4.0  CL 102  --  101  CO2 23  --  26  GLUCOSE 164*  --  206*  BUN 29*  --  42*  CREATININE 1.51*  --  1.64*  CALCIUM 8.5*  --  8.1*  MG  --  2.1  --    GFR: Estimated Creatinine Clearance: 43.9 mL/min (A) (by C-G formula based on SCr of 1.64 mg/dL (H)). Liver Function Tests: No results for input(s): AST, ALT, ALKPHOS, BILITOT, PROT, ALBUMIN in the last 168 hours. No results for input(s): LIPASE, AMYLASE in the last 168 hours. No results for input(s): AMMONIA in the last 168 hours. Coagulation Profile: No results for input(s): INR, PROTIME in the last 168 hours. Cardiac Enzymes: Recent Labs  Lab 09/11/17 1033 09/11/17 1617  TROPONINI 0.06* 0.05*   BNP (last 3 results) No results for input(s): PROBNP in the last 8760 hours. HbA1C: No results for input(s): HGBA1C in the last 72 hours. CBG: Recent Labs  Lab 09/13/17 0335 09/13/17 0651 09/13/17 2019 09/14/17 0658  09/14/17 2030  GLUCAP 204* 208* 319* 163* 156*   Lipid Profile: No results for input(s): CHOL, HDL, LDLCALC, TRIG, CHOLHDL, LDLDIRECT in the last 72 hours. Thyroid Function Tests: No results for input(s): TSH, T4TOTAL, FREET4, T3FREE, THYROIDAB in the last 72 hours. Anemia Panel: No results for input(s): VITAMINB12, FOLATE, FERRITIN, TIBC, IRON, RETICCTPCT in the last 72 hours. Sepsis Labs: No results for input(s): PROCALCITON, LATICACIDVEN in the last 168 hours.  No results found for this or any previous visit (from the past 240 hour(s)).       Radiology Studies: No results found.      Scheduled Meds: . amLODipine  5 mg Oral Daily  . aspirin EC  325 mg Oral Daily  . atorvastatin  40 mg Oral QHS  . carvedilol  12.5 mg Oral BID  WC  . cloNIDine  0.1 mg Transdermal Weekly  . enoxaparin (LOVENOX) injection  40 mg Subcutaneous Daily  . feeding supplement (ENSURE ENLIVE)  237 mL Oral QID  . finasteride  5 mg Oral Daily  . insulin aspart  0-9 Units Subcutaneous TID WC  . insulin glargine  6 Units Subcutaneous QHS  . isosorbide mononitrate  30 mg Oral Daily  . Melatonin  3 mg Oral QHS  . nystatin  5 mL Oral QID  . pantoprazole (PROTONIX) IV  40 mg Intravenous Q12H  . polyethylene glycol  17 g Oral Daily  . sodium chloride flush  3 mL Intravenous Q12H  . tamsulosin  0.4 mg Oral Daily  . traMADol  50 mg Oral Once   Continuous Infusions: . fluconazole (DIFLUCAN) IV Stopped (09/15/17 1307)  . valproate sodium 125 mg (09/15/17 1452)     LOS: 9 days    Time spent: 35 minutes.     Elmarie Shiley, MD [facility] Pager [phone]  If 7PM-7AM, please contact night-coverage [email or web address] Password [email or web address] 09/15/2017, 5:20 PM

## 2017-09-16 ENCOUNTER — Encounter (HOSPITAL_COMMUNITY): Payer: Self-pay

## 2017-09-16 LAB — GLUCOSE, CAPILLARY
GLUCOSE-CAPILLARY: 161 mg/dL — AB (ref 65–99)
GLUCOSE-CAPILLARY: 250 mg/dL — AB (ref 65–99)
Glucose-Capillary: 147 mg/dL — ABNORMAL HIGH (ref 65–99)

## 2017-09-16 MED ORDER — PANTOPRAZOLE SODIUM 40 MG PO TBEC
40.0000 mg | DELAYED_RELEASE_TABLET | Freq: Two times a day (BID) | ORAL | Status: DC
  Administered 2017-09-16 – 2017-09-23 (×12): 40 mg via ORAL
  Filled 2017-09-16 (×12): qty 1

## 2017-09-16 MED ORDER — FLUCONAZOLE 100 MG PO TABS
100.0000 mg | ORAL_TABLET | Freq: Every day | ORAL | Status: DC
  Administered 2017-09-17 – 2017-09-21 (×3): 100 mg via ORAL
  Filled 2017-09-16 (×5): qty 1

## 2017-09-16 MED ORDER — ONDANSETRON HCL 4 MG PO TABS: 4.0000 mg | ORAL_TABLET | Freq: Three times a day (TID) | ORAL | Status: DC | PRN

## 2017-09-16 MED ORDER — AMLODIPINE BESYLATE 10 MG PO TABS
10.0000 mg | ORAL_TABLET | Freq: Every day | ORAL | Status: DC
  Administered 2017-09-17 – 2017-11-06 (×45): 10 mg via ORAL
  Filled 2017-09-16 (×49): qty 1

## 2017-09-16 MED ORDER — DIVALPROEX SODIUM 125 MG PO CSDR
125.0000 mg | DELAYED_RELEASE_CAPSULE | Freq: Two times a day (BID) | ORAL | Status: DC
  Administered 2017-09-16 – 2017-11-05 (×90): 125 mg via ORAL
  Filled 2017-09-16 (×98): qty 1

## 2017-09-16 NOTE — Progress Notes (Signed)
Pt refusing NT to check blood sugar RN at bedside requested to complete morning assessment , pt stated "get out of here, you aren't a doctor." RN educated pt on importance of assessments, pt still refused. Offer to set up breakfast for pt and pt tried to throw breakfast off table. Pt told RN to leave.  Sitter at bedside, will continue to monitor

## 2017-09-16 NOTE — Progress Notes (Addendum)
PROGRESS NOTE  Shawn Pearson  UVO:[id number] DOB: 1941/01/10 DOA: 09/05/2017 PCP: Shawn Caras, NP   Brief Narrative: Brief Narrative:  Patient is 76 year old male with dementia, CK D, hypertension, medical noncompliance with medications, negative stress test in February 2018, admitted from skilled nursing facility with chest pain and uncontrolled blood pressure due to noncompliance with medications. He also report suicidal , homicidal thought. He was evaluated by psych. He was started on Depakote IV. Unable to use haldol due to prolong QT. He continue to refuse medication at times. Psych is recommending inpatient geropsychiatry facility.   Assessment & Plan:  Principal Problem:   Dementia Active Problems:   Hypertensive urgency   CKD stage 3 secondary to diabetes (HCC)   Chest pain   Noncompliance with medication regimen   Agitation   Elevated troponin  Chest pain: Suspect esophagitis, improving.  - Continue PPI and diflucan for esophagitis.  - Cardiology consulted, recommend BP control, and no further invasive test. Continue BP management as below.  Esophagitis:  - Taking po, will convert IV protonix and IV diflucan   HTN with severe range HTN: Complicated by medication nonadherence. Recommend treating psychiatric illness in effort to minimize disruptions in care. - Continue po medications, clonidine patch, and prn IV hydralazine  Dementia, bipolar disorder with current suicidal and homicidal ideation: Per psychiatry recommendations (see addendum to 11/23 note on 11/27).  - Continue increased depakote to 125mg  BID - Limit BZDs, anticholinergics as able - Continue melatonin per psychiatry recommendations, delirium precautions - No antipsychotics to be used due to QT prolongation (most recently QTc 571msec) - Continue suicide precautions, 1:1 sitter.   T2DM:  - Continue lantus and SSI  CKD stage III: SCr slightly above presumed baseline (1.3-1.5) at 1.64 - Holding  cozaar.  - Monitor BMP  Noncompliance with medication regimen:  - Significantly impacting medical care.   Iron deficiency anemia: Chronic, stable. Normocytic. FOBT neg.  - Monitor prn signs of bleeding or symptomatic anemia.    Stage III rectal adenocarcinoma: Sees GI at [facility], MD, drom discharge summary May 2018: "New dx Invasive Rectal adenocarcinoma. GI d/w colorectal surgeon Shawn Pearson, no surgery planned.No plans for further staging with EUS Patient evaluated by [facility] following and Radiation Oncology  It appears to be a clinical T3 rectal cancer with bleeding, no obvious metastases. Poor performance status with multiple significant comorbid conditions. He is not a candidate for systemic therapy and medically inoperable. Plan for palliative Radiation therapy for 5 days in an effort to at least stop bleeding, prevent/delay obstruction and to keep Shawn Pearson out of the hospital. Shawn Pearson (radiation oncologist) at [facility] will be assuming pt's outpatient care and appointment will be made by transitional nurse Navigator."  Abdominal pain: Resolved. KUB with constipation.  - Continue lactulose for constipation.  Stump pain:   - Start tramadol PRN, continue neurontin  DVT prophylaxis: Lovenox Code Status: Full code  Family Communication: None at bedside  Disposition Plan: Inpatient psychiatry when bed available. He is medically stable.   Consultants:   Cardiology  Psych   Procedures:  ECHO; Left ventricle: The cavity size was normal. There was moderate   concentric hypertrophy. Systolic function was mildly to   moderately reduced. The estimated ejection fraction was in the   range of 40% to 45%. Hypokineis in the inferoseptal and inferior   myocardium. Features are consistent with a pseudonormal left   ventricular filling pattern, with concomitant abnormal relaxation   and increased filling pressure (grade  2 diastolic dysfunction).   Doppler parameters  are consistent with high ventricular filling   pressure. - Aortic valve: Transvalvular velocity was within the normal range.   There was no stenosis. There was no regurgitation. - Mitral valve: Transvalvular velocity was within the normal range.   There was no evidence for stenosis. There was mild regurgitation. - Left atrium: The atrium was moderately dilated. - Right ventricle: The cavity size was normal. Wall thickness was   normal. Systolic function was normal. - Right atrium: The atrium was moderately dilated. - Atrial septum: No defect or patent foramen ovale was identified   by color flow Doppler. - Tricuspid valve: There was mild regurgitation. - Pulmonary arteries: Systolic pressure was within the normal   range. PA peak pressure: 29 mm Hg (S).  Antimicrobials:  None  Subjective: Has bilateral stump pain that is stable. No abdominal pain reported. States he is hungry and thinks we didn't let him eat breakfast. Shawn Pearson states he refused this.   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  Neuro: A&Ox3, CN II-XII without deficits Psych: Hostile behavior toward staff, endorsing homicidal and suicidal ideation this morning. Not responding to internal stimuli.  Data Reviewed: I have personally reviewed following labs and imaging studies  CBC: Recent Labs  Lab 09/14/17 0453  WBC 8.2  HGB 8.7*  HCT 26.5*  MCV 79.6  PLT 177   Basic Metabolic Panel: Recent Labs  Lab 09/10/17 1412 09/11/17 1033 09/14/17 0453  NA 134*  --   133*  K 4.4  --  4.0  CL 102  --  101  CO2 23  --  26  GLUCOSE 164*  --  206*  BUN 29*  --  42*  CREATININE 1.51*  --  1.64*  CALCIUM 8.5*  --  8.1*  MG  --  2.1  --    Cardiac Enzymes: Recent Labs  Lab 09/11/17 1033 09/11/17 1617  TROPONINI 0.06* 0.05*   CBG: Recent Labs  Lab 09/14/17 2030 09/15/17 1811 09/15/17 2222 09/16/17 0956 09/16/17 1142  GLUCAP 156* 384* 230* 161* 147*    Radiology Studies: No results found.  Scheduled Meds: . amLODipine  10 mg Oral Daily  . aspirin EC  325 mg Oral Daily  . atorvastatin  40 mg Oral QHS  . carvedilol  12.5 mg Oral BID WC  . cloNIDine  0.1 mg Transdermal Weekly  . divalproex  125 mg Oral Q12H  . enoxaparin (LOVENOX) injection  40 mg Subcutaneous Daily  . feeding supplement (ENSURE ENLIVE)  237 mL Oral QID  . finasteride  5 mg Oral Daily  . [START ON 09/17/2017] fluconazole  100 mg Oral Daily  . insulin aspart  0-9 Units Subcutaneous TID WC  . insulin glargine  6 Units Subcutaneous QHS  . isosorbide mononitrate  30 mg Oral Daily  . lactulose  30 g Oral BID  . Melatonin  3 mg Oral QHS  . nystatin  5 mL Oral  QID  . pantoprazole  40 mg Oral BID  . polyethylene glycol  17 g Oral Daily  . sodium chloride flush  3 mL Intravenous Q12H  . tamsulosin  0.4 mg Oral Daily  . traMADol  50 mg Oral Once   Continuous Infusions:   LOS: 10 days   Time spent: 25 minutes.   Vance Gather, MD [facility] Pager [phone]   If 7PM-7AM, please contact night-coverage [email or web address] Password [email or web address] 09/16/2017, 1:14 PM

## 2017-09-16 NOTE — Consult Note (Addendum)
[facility] Psych Consult Progress Note  09/16/2017 3:06 PM Shawn Pearson  MRN:  [id number] Subjective:   Shawn Pearson reports, "my legs are cramping" when asked about how he was doing. He would not answer further questions although he would look up at the notewriter when addressed. His sitter reports that he did not eat his breakfast or lunch today or yesterday. He has been drinking ensure. He is sleeping throughout the day. Patient reports that he is not sleeping at night.   Principal Problem: Dementia Diagnosis:   Patient Active Problem List   Diagnosis Date Noted  . Elevated troponin [R74.8]   . Agitation [R45.1]   . Dementia [F03.90] 09/06/2017  . Noncompliance with medication regimen [Z91.14] 09/06/2017  . Anemia [D64.9] 08/30/2017  . Hypertension [I10] 08/30/2017  . Hypertensive urgency [I16.0] 08/21/2017  . DM2 (diabetes mellitus, type 2) ([place]) [E11.9] 08/21/2017  . CKD stage 3 secondary to diabetes ([place]) [Y07.37, N18.3] 08/21/2017  . Adenocarcinoma of rectum, stage 3 ([place]) [C20] 08/21/2017  . Hypertensive emergency [I16.1] 08/21/2017  . Chest pain [R07.9]    Total Time spent with patient: 15 minutes  Past Psychiatric History: Dementia   Past Medical History:  Past Medical History:  Diagnosis Date  . Aortic atherosclerosis ([place])   . Dementia   . Diabetes mellitus without complication ([place])   . History of angina   . Hypertension   . Renal disorder    CKD stage 3    Past Surgical History:  Procedure Laterality Date  . BELOW KNEE LEG AMPUTATION Bilateral    Family History: History reviewed. No pertinent family history. Family Psychiatric  History: Unknown  Social History:  Social History   Substance and Sexual Activity  Alcohol Use No     Social History   Substance and Sexual Activity  Drug Use No    Social History   Socioeconomic History  . Marital status: Widowed    Spouse name: None  . Number of children: None  . Years of education: None  . Highest  education level: None  Social Needs  . Financial resource strain: None  . Food insecurity - worry: None  . Food insecurity - inability: None  . Transportation needs - medical: None  . Transportation needs - non-medical: None  Occupational History  . None  Tobacco Use  . Smoking status: Unknown If Ever Smoked  . Smokeless tobacco: Never Used  Substance and Sexual Activity  . Alcohol use: No  . Drug use: No  . Sexual activity: No  Other Topics Concern  . None  Social History Narrative  . None    Sleep: Fair  Appetite:  Poor  Current Medications: Current Facility-Administered Medications  Medication Dose Route Frequency Provider Last Rate Last Dose  . acetaminophen (TYLENOL) tablet 650 mg  650 mg Oral Q4H PRN Phillips Grout, MD   650 mg at 09/14/17 2032  . amLODipine (NORVASC) tablet 10 mg  10 mg Oral Daily Patrecia Pour, MD      . aspirin EC tablet 325 mg  325 mg Oral Daily Phillips Grout, MD   325 mg at 09/15/17 0937  . atorvastatin (LIPITOR) tablet 40 mg  40 mg Oral QHS Phillips Grout, MD   40 mg at 09/15/17 2207  . carvedilol (COREG) tablet 12.5 mg  12.5 mg Oral BID WC Alphonzo Grieve, MD   12.5 mg at 09/15/17 1759  . cloNIDine (CATAPRES - Dosed in mg/24 hr) patch 0.1 mg  0.1 mg Transdermal  Weekly Regalado, Belkys A, MD   0.1 mg at 09/10/17 1917  . divalproex (DEPAKOTE SPRINKLE) capsule 125 mg  125 mg Oral Q12H Vance Gather B, MD      . enoxaparin (LOVENOX) injection 40 mg  40 mg Subcutaneous Daily Theodis Blaze, MD   40 mg at 09/15/17 2993  . feeding supplement (ENSURE ENLIVE) (ENSURE ENLIVE) liquid 237 mL  237 mL Oral QID Regalado, Belkys A, MD   237 mL at 09/16/17 1253  . finasteride (PROSCAR) tablet 5 mg  5 mg Oral Daily Derrill Kay A, MD   5 mg at 09/15/17 7169  . [START ON 09/17/2017] fluconazole (DIFLUCAN) tablet 100 mg  100 mg Oral Daily Vance Gather B, MD      . hydrALAZINE (APRESOLINE) injection 20 mg  20 mg Intravenous Q6H PRN Regalado, Belkys A, MD   20 mg at  09/13/17 2156  . insulin aspart (novoLOG) injection 0-9 Units  0-9 Units Subcutaneous TID WC Theodis Blaze, MD   9 Units at 09/15/17 1728  . insulin glargine (LANTUS) injection 6 Units  6 Units Subcutaneous QHS Phillips Grout, MD   6 Units at 09/15/17 2213  . isosorbide mononitrate (IMDUR) 24 hr tablet 30 mg  30 mg Oral Daily Regalado, Belkys A, MD   30 mg at 09/15/17 0937  . lactulose (CHRONULAC) 10 GM/15ML solution 30 g  30 g Oral BID Regalado, Belkys A, MD   30 g at 09/15/17 2211  . Melatonin TABS 3 mg  3 mg Oral QHS Regalado, Belkys A, MD   3 mg at 09/15/17 2207  . nitroGLYCERIN (NITROSTAT) SL tablet 0.4 mg  0.4 mg Sublingual PRN Phillips Grout, MD   0.4 mg at 09/11/17 0943  . nystatin (MYCOSTATIN) 100000 UNIT/ML suspension 500,000 Units  5 mL Oral QID Regalado, Belkys A, MD   500,000 Units at 09/15/17 2214  . ondansetron (ZOFRAN) tablet 4 mg  4 mg Oral Q8H PRN Patrecia Pour, MD      . pantoprazole (PROTONIX) EC tablet 40 mg  40 mg Oral BID Patrecia Pour, MD      . polyethylene glycol (MIRALAX / GLYCOLAX) packet 17 g  17 g Oral Daily Janett Billow, RPH   17 g at 09/15/17 0936  . polyvinyl alcohol (LIQUIFILM TEARS) 1.4 % ophthalmic solution 1 drop  1 drop Both Eyes PRN Dana Allan I, MD      . sodium chloride flush (NS) 0.9 % injection 3 mL  3 mL Intravenous Q12H Regalado, Belkys A, MD   3 mL at 09/16/17 1004  . sodium chloride flush (NS) 0.9 % injection 3 mL  3 mL Intravenous PRN Regalado, Belkys A, MD   3 mL at 09/12/17 1624  . tamsulosin (FLOMAX) capsule 0.4 mg  0.4 mg Oral Daily Derrill Kay A, MD   0.4 mg at 09/15/17 0937  . traMADol (ULTRAM) tablet 50 mg  50 mg Oral Once Bodenheimer, Charles A, NP      . traMADol (ULTRAM) tablet 50 mg  50 mg Oral Q8H PRN Regalado, Belkys A, MD   50 mg at 09/15/17 6789    Lab Results:  Results for orders placed or performed during the hospital encounter of 09/05/17 (from the past 48 hour(s))  Glucose, capillary     Status: Abnormal    Collection Time: 09/14/17  8:30 PM  Result Value Ref Range   Glucose-Capillary 156 (H) 65 - 99 mg/dL  Glucose, capillary  Status: Abnormal   Collection Time: 09/15/17  6:11 PM  Result Value Ref Range   Glucose-Capillary 384 (H) 65 - 99 mg/dL  Glucose, capillary     Status: Abnormal   Collection Time: 09/15/17 10:22 PM  Result Value Ref Range   Glucose-Capillary 230 (H) 65 - 99 mg/dL  Glucose, capillary     Status: Abnormal   Collection Time: 09/16/17  9:56 AM  Result Value Ref Range   Glucose-Capillary 161 (H) 65 - 99 mg/dL  Glucose, capillary     Status: Abnormal   Collection Time: 09/16/17 11:42 AM  Result Value Ref Range   Glucose-Capillary 147 (H) 65 - 99 mg/dL    Musculoskeletal: Strength & Muscle Tone:UTA since patient lying in bed. Gait & Station:He hasbilateral BKA. Patient leans:N/A    Psychiatric Specialty Exam: Physical Exam  Nursing note and vitals reviewed. Constitutional: He is oriented to person, place, and time. He appears well-developed and well-nourished.  HENT:  Head: Normocephalic and atraumatic.  Neck: Normal range of motion.  Respiratory: Effort normal.  Musculoskeletal: Normal range of motion.  Neurological: He is alert and oriented to person, place, and time.  Skin: No rash noted.  Psychiatric: His speech is normal. He is agitated. Cognition and memory are impaired. He expresses impulsivity.    ROS He reports leg camps but unable to fully assess since patient would not participate in interview.   Blood pressure (!) 184/71, pulse 76, temperature 97.7 F (36.5 C), temperature source Oral, resp. rate 14, height 6\' 2"  (1.88 m), weight 93.2 kg (205 lb 7.5 oz), SpO2 100 %.Body mass index is 26.38 kg/m.  General Appearance: Well Groomed, elderly, African American male with bilateral BKA who is lying in bed with a hospital gown. NAD.   Eye Contact:  Poor  Speech:  Clear and Coherent and Normal Rate  Volume:  Normal  Mood:  "My legs are  cramping."  Affect:  Irritable  Thought Process:  Linear  Orientation:  Other:  Oriented to self.  Thought Content:  Superficially linear. Patient was minimally verbal.  Suicidal Thoughts:  UTA since patient would not cooperative with interview.   Homicidal Thoughts: UTA since patient would not cooperative with interview.   Memory:  History of dementia.   Judgement:  Impaired  Insight:  Lacking  Psychomotor Activity:  Decreased  Concentration:  Concentration: UTA since patient would not cooperative with interview.  and Attention Span: UTA since patient would not cooperative with interview.   Recall:  History of dementia.  Fund of Knowledge:  UTA since patient would not cooperative with interview.   Language:  UTA since patient would not cooperative with interview.   Akathisia:  UTA since patient would not cooperative with interview.   Handed:  Right  AIMS (if indicated):   N/A  Assets:  Housing  ADL's:  Impaired and requires assistance.   Cognition: Impaired. History of dementia.   Sleep:   Fair    Assessment:  Shawn Pearson is a 76 y.o. male who was admitted withchest pain in the setting of uncontrolled blood pressure secondary to poor medication compliance.He continues to be agitated with attempts to administer treatment and he continues to refuse care including medications and blood draws. He has reportedly not been eating well and sleeping has been variable (appears that he sleeps during the day). Per record review, he has a history of dementia and likely has behavioral disturbance secondary to this condition. Unfortunately he has prolonged QTc (526) so antipsychotic medications are  not recommended given risk for arrhythmias.He is tolerating Depakote well so it can be titrated for agitation and to avoid use of benzodiazepines which can worsen confusion and cause disinhibition.   Treatment Plan Summary: -Continue Depakote125 mg BID for agitation. Can titrate evening dose by 125  mg increments as clinically indicated for worsening agitation.  -Can consider starting Remeron 7.5 mg qhs for irritability and to stimulate appetite.  -Avoid medications that can worsen confusion and agitation such as antihistamines, anticholingerics and benzodiazepines.  -Continue Melatonin 3 mg qhs to regulate sleep/wake cycle. Encourage patient to stay awake in the daytime so that he may sleep at night.  -Antipsychotic use is not advised given prolonged QTc and risk for arrhthymias (QTc 559 on 11/23, increased from 526 on 11/17). -Use soft restraints as needed for imminent risk of harming self or others and if needed for emergent treatment.   -Recommend geropsychiatric hospitalization for further stabilization and treatment.  -Psychiatry will follow patient as clinically needed.   Faythe Dingwall, DO 09/16/2017, 3:06 PM

## 2017-09-16 NOTE — Progress Notes (Signed)
Pt refused evening medications  Pt non verbal when RN asking follow up questions  Will continue to monitor

## 2017-09-16 NOTE — Progress Notes (Signed)
Mel RN able to hang IV Diflucan, flush IV and check blood sugar. Pt refused all other oral medications. Will continue to monitor

## 2017-09-16 NOTE — Progress Notes (Signed)
Pt stated he had a BM  NT and RN at bedside to change pt  Pt yelling profanity and attempting to hit staff.  Pt educated staff is trying to help  Pt allowed staff to change bed pad and check blood sugar

## 2017-09-16 NOTE — Progress Notes (Signed)
Patient refusing heart/lung auscultation at this time. Patient also refusing to have vital signs done and blood sugar taken. Will try again later.

## 2017-09-16 NOTE — Progress Notes (Signed)
MD aware pt refusing PO medications

## 2017-09-16 NOTE — Clinical Social Work Note (Addendum)
CSW notes yesterday's addendum to 9/24 psych note stating that patient needs inpatient admission. CSW discussed with patient. Sitter at bedside. Patient stated he is returning home when he discharges. CSW reminded patient of conversation with his wife on Monday that returning home is not an options. Patient stated he is not returning to [facility] and voiced some profanities about it. CSW asked patient again if he is agreeable to inpatient psych admission. Patient nodded his head and when CSW said I would start on referrals patient replied, "alright." Patient's IV medications need to be switched to PO before referrals can be sent out to avoid automatic denials. CSW paged MD to notify.  Dayton Scrape, CSW [phone]  4:18 pm [facility] does not take geropsych patients. CSW faxed referrals to: [place]  Will follow up in the morning once referrals received.  Dayton Scrape, [place]

## 2017-09-16 NOTE — Progress Notes (Signed)
Second attempt to pass pt medications. Pt refusing medications. Pt agitated and shaking his head no. Second RN Mel trying to pass medications for a third attempt.

## 2017-09-16 NOTE — Plan of Care (Signed)
  Health Behavior/Discharge Planning: Ability to manage health-related needs will improve Description Pt. Non-compliant. Will not answer questions or engage in conversation with RN about health.  09/16/2017 2246 - Not Progressing by Tristan Schroeder, RN   Clinical Measurements: Ability to maintain clinical measurements within normal limits will improve 09/16/2017 2246 - Not Progressing by Tristan Schroeder, RN   Nutrition: Adequate nutrition will be maintained 09/16/2017 2246 - Not Progressing by Tristan Schroeder, RN   Coping: Level of anxiety will decrease 09/16/2017 2246 - Not Progressing by Tristan Schroeder, RN

## 2017-09-17 LAB — GLUCOSE, CAPILLARY: Glucose-Capillary: 348 mg/dL — ABNORMAL HIGH (ref 65–99)

## 2017-09-17 MED ORDER — HYDROCORTISONE 2.5 % RE CREA
TOPICAL_CREAM | RECTAL | Status: DC | PRN
  Filled 2017-09-17: qty 28.35

## 2017-09-17 NOTE — Clinical Social Work Note (Addendum)
Patient has received his three psych hospital denials: [facility], [place], and [place]. CSW will initiate referral to [place], [place]  11:34 am Referral faxed to [facility]. Asked on fax cover sheet that patient be placed on the priority list due to aggressive behaviors and included all notes in referral packet that showed these behaviors. CSW will follow up with [place] once a successful fax receipt is obtained.  Dayton Scrape, CSW [phone]  1:43 pm CSW called to confirm that [place] received the referral information. Demographics referral was completed. They will give information to RN and she will call CSW with update regarding patient being placed on wait list.  Dayton Scrape, [place]

## 2017-09-17 NOTE — Plan of Care (Signed)
  Progressing Health Behavior/Discharge Planning: Ability to manage health-related needs will improve Description Pt. Non-compliant. Will not answer questions or engage in conversation with RN about health.  09/17/2017 2029 - Progressing by Ruben Im, RN Clinical Measurements: Ability to maintain clinical measurements within normal limits will improve 09/17/2017 2029 - Progressing by Ruben Im, RN Will remain free from infection 09/17/2017 2029 - Progressing by Ruben Im, RN Diagnostic test results will improve 09/17/2017 2029 - Progressing by Ruben Im, RN Respiratory complications will improve 09/17/2017 2029 - Progressing by Ruben Im, RN Cardiovascular complication will be avoided 09/17/2017 2029 - Progressing by Ruben Im, RN Nutrition: Adequate nutrition will be maintained 09/17/2017 2029 - Progressing by Ruben Im, RN Coping: Level of anxiety will decrease 09/17/2017 2029 - Progressing by Ruben Im, RN Elimination: Will not experience complications related to bowel motility 09/17/2017 2029 - Progressing by Ruben Im, RN Will not experience complications related to urinary retention 09/17/2017 2029 - Progressing by Ruben Im, RN Skin Integrity: Risk for impaired skin integrity will decrease 09/17/2017 2029 - Progressing by Ruben Im, RN

## 2017-09-17 NOTE — Progress Notes (Signed)
PROGRESS NOTE  Shawn Pearson  MWN:[id number] DOB: [birth year] DOA: 09/05/2017 PCP: Mabeline Caras, NP   Brief Narrative: Brief Narrative:  Patient is 76 year old male with dementia, CK D, hypertension, medical noncompliance with medications, negative stress test in February 2018, admitted from skilled nursing facility with chest pain and uncontrolled blood pressure due to noncompliance with medications. He also report suicidal , homicidal thought. He was evaluated by psych. He was started on Depakote IV. Unable to use haldol due to prolong QT. He continue to refuse medication at times. Psych is recommending inpatient geropsychiatry facility.   Assessment & Plan:  Principal Problem:   Dementia Active Problems:   Hypertensive urgency   CKD stage 3 secondary to diabetes (HCC)   Chest pain   Noncompliance with medication regimen   Agitation   Elevated troponin  Chest pain: Suspect esophagitis, improving.  - Continue PPI and diflucan for esophagitis.  - Cardiology consulted, recommend BP control, and no further invasive test. Continue BP management as below.  Esophagitis:  - Taking po, will convert IV protonix and IV diflucan   HTN with severe range HTN: Complicated by medication nonadherence. Recommend treating psychiatric illness in effort to minimize disruptions in care. - Continue po medications, clonidine patch, and prn IV hydralazine  Dementia, bipolar disorder with current suicidal and homicidal ideation: Per psychiatry recommendations (see addendum to 11/23 note on 11/27).  - Continue increased depakote to 125mg  BID, pt starting to take medications as prescribed. - Limit BZDs, anticholinergics as able - Continue melatonin per psychiatry recommendations, delirium precautions - No antipsychotics to be used due to QT prolongation (most recently QTc 56msec) - Continue suicide precautions, 1:1 sitter.   T2DM:  - Continue lantus and SSI if patient will allow (none since  yesterday evening)  CKD stage III: SCr slightly above presumed baseline (1.3-1.5) at 1.64 - Holding cozaar.  - Monitor BMP  Noncompliance with medication regimen:  - Significantly impacting medical care.   Iron deficiency anemia: Chronic, stable. Normocytic. FOBT neg.  - Monitor prn signs of bleeding or symptomatic anemia.    Stage III rectal adenocarcinoma: Sees GI at [facility], MD, drom discharge summary May 2018: "New dx Invasive Rectal adenocarcinoma. GI d/w colorectal surgeon Dr. Leland Johns, no surgery planned.No plans for further staging with EUS Patient evaluated by [facility] following and Radiation Oncology  It appears to be a clinical T3 rectal cancer with bleeding, no obvious metastases. Poor performance status with multiple significant comorbid conditions. He is not a candidate for systemic therapy and medically inoperable. Plan for palliative Radiation therapy for 5 days in an effort to at least stop bleeding, prevent/delay obstruction and to keep Mr. Dy out of the hospital. Dr. Dwaine Deter (radiation oncologist) at [facility] will be assuming pt's outpatient care and appointment will be made by transitional nurse Navigator." - No evidence of acute complication.   Abdominal pain: Resolved. KUB with constipation.  - Continue lactulose for constipation.  Stump pain:   - Started tramadol PRN, continue neurontin.  DVT prophylaxis: Lovenox Code Status: Full code  Family Communication: None at bedside  Disposition Plan: Inpatient psychiatry when bed available. He is medically stable.   Consultants:   Cardiology  Psychiatry   Procedures:  ECHO; Left ventricle: The cavity size was normal. There was moderate   concentric hypertrophy. Systolic function was mildly to   moderately reduced. The estimated ejection fraction was in the   range of 40% to 45%. Hypokineis in the inferoseptal and inferior   myocardium. Features  are consistent with a pseudonormal left    ventricular filling pattern, with concomitant abnormal relaxation   and increased filling pressure (grade 2 diastolic dysfunction).   Doppler parameters are consistent with high ventricular filling   pressure. - Aortic valve: Transvalvular velocity was within the normal range.   There was no stenosis. There was no regurgitation. - Mitral valve: Transvalvular velocity was within the normal range.   There was no evidence for stenosis. There was mild regurgitation. - Left atrium: The atrium was moderately dilated. - Right ventricle: The cavity size was normal. Wall thickness was   normal. Systolic function was normal. - Right atrium: The atrium was moderately dilated. - Atrial septum: No defect or patent foramen ovale was identified   by color flow Doppler. - Tricuspid valve: There was mild regurgitation. - Pulmonary arteries: Systolic pressure was within the normal   range. PA peak pressure: 29 mm Hg (S).  Antimicrobials:  None  Subjective: Has bilateral stump pain described as cramping that is constant, chronic and stable. Denies other concerns. Wants me to leave him alone.  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  Neuro: A&Ox3, CN II-XII without deficits Psych: Still reporting HI this AM. Denies hallucinations, not responding to internal stimuli.  Data Reviewed: I have personally reviewed following labs and imaging studies  CBC: Recent Labs  Lab 09/14/17 0453  WBC 8.2  HGB 8.7*  HCT 26.5*  MCV 79.6  PLT 161   Basic Metabolic Panel: Recent Labs  Lab  09/10/17 1412 09/11/17 1033 09/14/17 0453  NA 134*  --  133*  K 4.4  --  4.0  CL 102  --  101  CO2 23  --  26  GLUCOSE 164*  --  206*  BUN 29*  --  42*  CREATININE 1.51*  --  1.64*  CALCIUM 8.5*  --  8.1*  MG  --  2.1  --    Cardiac Enzymes: Recent Labs  Lab 09/11/17 1033 09/11/17 1617  TROPONINI 0.06* 0.05*   CBG: Recent Labs  Lab 09/15/17 1811 09/15/17 2222 09/16/17 0956 09/16/17 1142 09/16/17 2122  GLUCAP 384* 230* 161* 147* 250*    Radiology Studies: No results found.  Scheduled Meds: . amLODipine  10 mg Oral Daily  . aspirin EC  325 mg Oral Daily  . atorvastatin  40 mg Oral QHS  . carvedilol  12.5 mg Oral BID WC  . cloNIDine  0.1 mg Transdermal Weekly  . divalproex  125 mg Oral Q12H  . enoxaparin (LOVENOX) injection  40 mg Subcutaneous Daily  . feeding supplement (ENSURE ENLIVE)  237 mL Oral QID  . finasteride  5 mg Oral Daily  . fluconazole  100 mg Oral Daily  . insulin aspart  0-9 Units Subcutaneous TID WC  . insulin glargine  6 Units Subcutaneous QHS  . isosorbide mononitrate  30 mg Oral Daily  . lactulose  30 g Oral BID  . Melatonin  3 mg Oral QHS  . nystatin  5 mL Oral QID  . pantoprazole  40 mg Oral BID  . polyethylene glycol  17 g Oral Daily  . sodium chloride flush  3 mL Intravenous Q12H  . tamsulosin  0.4 mg Oral Daily  . traMADol  50 mg Oral Once   Continuous Infusions:   LOS: 11 days   Time spent: 25 minutes.   Vance Gather, MD [facility] Pager [phone]   If 7PM-7AM, please contact night-coverage [email or web address] Password [email or web address] 09/17/2017, 1:01 PM

## 2017-09-17 NOTE — Plan of Care (Signed)
Patient continues to refuse some care such as CBGs and some medications however did take the majority of his medications on this shift from this RN.  Continues to be very irritable and frustrated with attempts by staff to provide care.  Patient drinks Ensure frequently however refusing most meals.  Oxygen saturation remains in the 90's on room air, does not appear dyspneic with rolling from side to side in bed.  Sitter at bedside.

## 2017-09-18 LAB — GLUCOSE, CAPILLARY: GLUCOSE-CAPILLARY: 261 mg/dL — AB (ref 65–99)

## 2017-09-18 NOTE — Plan of Care (Signed)
  Progressing Health Behavior/Discharge Planning: Ability to manage health-related needs will improve Description Pt. Non-compliant. Will not answer questions or engage in conversation with RN about health.  09/18/2017 0128 - Progressing by Ruben Im, RN 09/17/2017 2029 - Progressing by Ruben Im, RN Clinical Measurements: Ability to maintain clinical measurements within normal limits will improve 09/17/2017 2029 - Progressing by Ruben Im, RN Will remain free from infection 09/18/2017 0128 - Progressing by Ruben Im, RN 09/17/2017 2029 - Progressing by Ruben Im, RN Diagnostic test results will improve 09/17/2017 2029 - Progressing by Ruben Im, RN Respiratory complications will improve 09/17/2017 2029 - Progressing by Ruben Im, RN Cardiovascular complication will be avoided 09/17/2017 2029 - Progressing by Ruben Im, RN Nutrition: Adequate nutrition will be maintained 09/17/2017 2029 - Progressing by Ruben Im, RN Coping: Level of anxiety will decrease 09/17/2017 2029 - Progressing by Ruben Im, RN Elimination: Will not experience complications related to bowel motility 09/17/2017 2029 - Progressing by Ruben Im, RN Will not experience complications related to urinary retention 09/17/2017 2029 - Progressing by Ruben Im, RN Skin Integrity: Risk for impaired skin integrity will decrease 09/18/2017 0128 - Progressing by Ruben Im, RN 09/17/2017 2029 - Progressing by Ruben Im, RN

## 2017-09-18 NOTE — Clinical Social Work Note (Addendum)
CSW received voicemail from [facility] late yesterday afternoon stating that patient has been put on their wait list. CSW will call today to see if he was put on the priority wait list.  Dayton Scrape, [place]  9:45 am Patient is not on the priority wait list. CSW spoke with Pamala Hurry, RN who stated they would review the referral for possibility of being put on priority list. She stated they have several that are demented with aggressive behaviors. CSW explained that although he has a dementia diagnosis, he is fully oriented.  Dayton Scrape, [place]

## 2017-09-18 NOTE — Progress Notes (Signed)
PROGRESS NOTE  Shawn Pearson  WUJ:[id number] DOB: [birth year] DOA: 09/05/2017 PCP: Mabeline Caras, NP   Brief Narrative: Brief Narrative:  Patient is 76 year old male with dementia, CK D, hypertension, medical noncompliance with medications, negative stress test in February 2018, admitted from skilled nursing facility with chest pain and uncontrolled blood pressure due to noncompliance with medications. He also report suicidal , homicidal thought. He was evaluated by psych. He was started on Depakote IV. Unable to use haldol due to prolong QT. He continue to refuse medication at times. Psych is recommending inpatient geropsychiatry facility.   Assessment & Plan:  Principal Problem:   Dementia Active Problems:   Hypertensive urgency   CKD stage 3 secondary to diabetes (HCC)   Chest pain   Noncompliance with medication regimen   Agitation   Elevated troponin  HTN with severe range HTN: Complicated by medication nonadherence. Recommend treating psychiatric illness in effort to minimize disruptions in care. - Continue po medications, clonidine patch, and prn IV hydralazine  Dementia, bipolar disorder with current suicidal and homicidal ideation: Per psychiatry recommendations (see addendum to 11/23 note on 11/27).  - Continue increased depakote to 125mg  BID, pt starting to take medications as prescribed. - Limit BZDs, anticholinergics as able - Continue melatonin per psychiatry recommendations, delirium precautions - No antipsychotics to be used due to QT prolongation (most recently QTc 554msec) - Continue suicide precautions, 1:1 sitter.   Chest pain: Suspect esophagitis, improving.  - Continue PPI and diflucan for esophagitis.  - Cardiology consulted, recommend BP control, and no further invasive test. Continue BP management as below.  Esophagitis:  - Taking po. Continue diflucan course x14 days and PPI.   T2DM:  - Continue lantus and SSI if patient will allow  CKD stage III:  SCr slightly above presumed baseline (1.3-1.5) at 1.64 - Holding cozaar.  - Monitor BMP in AM  Noncompliance with medication regimen:  - Significantly impacting medical care.   Iron deficiency anemia: Chronic, stable. Normocytic. FOBT neg.  - Monitor prn signs of bleeding or symptomatic anemia.    Stage III rectal adenocarcinoma: Sees GI at [facility], MD, drom discharge summary May 2018: "New dx Invasive Rectal adenocarcinoma. GI d/w colorectal surgeon Dr. Leland Johns, no surgery planned.No plans for further staging with EUS Patient evaluated by [facility] following and Radiation Oncology  It appears to be a clinical T3 rectal cancer with bleeding, no obvious metastases. Poor performance status with multiple significant comorbid conditions. He is not a candidate for systemic therapy and medically inoperable. Plan for palliative Radiation therapy for 5 days in an effort to at least stop bleeding, prevent/delay obstruction and to keep [name] out of the hospital. Dr. Dwaine Deter (radiation oncologist) at [facility] will be assuming pt's outpatient care and appointment will be made by transitional nurse Navigator." - No evidence of acute complication.   Abdominal pain: Resolved. KUB with constipation.  - Continue lactulose for constipation.  Stump pain:   - Started tramadol PRN, continue neurontin.  DVT prophylaxis: Lovenox Code Status: Full code  Family Communication: None at bedside  Disposition Plan: Inpatient psychiatry when bed available. He is medically stable for discharge.  Consultants:   Cardiology  Psychiatry   Procedures:  ECHO; Left ventricle: The cavity size was normal. There was moderate   concentric hypertrophy. Systolic function was mildly to   moderately reduced. The estimated ejection fraction was in the   range of 40% to 45%. Hypokineis in the inferoseptal and inferior   myocardium. Features are  consistent with a pseudonormal left   ventricular filling  pattern, with concomitant abnormal relaxation   and increased filling pressure (grade 2 diastolic dysfunction).   Doppler parameters are consistent with high ventricular filling   pressure. - Aortic valve: Transvalvular velocity was within the normal range.   There was no stenosis. There was no regurgitation. - Mitral valve: Transvalvular velocity was within the normal range.   There was no evidence for stenosis. There was mild regurgitation. - Left atrium: The atrium was moderately dilated. - Right ventricle: The cavity size was normal. Wall thickness was   normal. Systolic function was normal. - Right atrium: The atrium was moderately dilated. - Atrial septum: No defect or patent foramen ovale was identified   by color flow Doppler. - Tricuspid valve: There was mild regurgitation. - Pulmonary arteries: Systolic pressure was within the normal   range. PA peak pressure: 29 mm Hg (S).  Antimicrobials:  None  Subjective: His bottom hurts a bit, no bleeding. He did not eat breakfast because we would not add salt to it.  Objective: BP (!) 167/55 (BP Location: Left Arm)   Pulse 62   Temp 97.7 F (36.5 C) (Oral)   Resp 18   Ht 6\' 2"  (1.88 m)   Wt 92.3 kg (203 lb 7.8 oz)   SpO2 95%   BMI 26.13 kg/m  Gen: Chronically ill-appearing 76yo male in NAD Pulm: Non-labored; CTAB, no wheezes  CV: Regular rate, no murmur appreciated, no JVD Rectal: Nonthrombosed external hemorrhoids without erythema, no fissure or abscess.  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  Neuro: A&Ox3, CN II-XII without deficits Psych: +HI. Denies hallucinations, not responding to internal stimuli.  Data Reviewed: I have personally reviewed following labs and imaging studies  CBC: Recent Labs  Lab 09/14/17 0453  WBC 8.2  HGB 8.7*  HCT 26.5*  MCV 79.6  PLT 326   Basic Metabolic Panel: Recent Labs  Lab 09/14/17 0453  NA 133*  K 4.0  CL 101  CO2 26  GLUCOSE 206*  BUN 42*    CREATININE 1.64*  CALCIUM 8.1*   Cardiac Enzymes: Recent Labs  Lab 09/11/17 1617  TROPONINI 0.05*   CBG: Recent Labs  Lab 09/15/17 2222 09/16/17 0956 09/16/17 1142 09/16/17 2122 09/17/17 2037  GLUCAP 230* 161* 147* 250* 348*    Radiology Studies: No results found.  Scheduled Meds: . amLODipine  10 mg Oral Daily  . aspirin EC  325 mg Oral Daily  . atorvastatin  40 mg Oral QHS  . carvedilol  12.5 mg Oral BID WC  . cloNIDine  0.1 mg Transdermal Weekly  . divalproex  125 mg Oral Q12H  . enoxaparin (LOVENOX) injection  40 mg Subcutaneous Daily  . feeding supplement (ENSURE ENLIVE)  237 mL Oral QID  . finasteride  5 mg Oral Daily  . fluconazole  100 mg Oral Daily  . insulin aspart  0-9 Units Subcutaneous TID WC  . insulin glargine  6 Units Subcutaneous QHS  . isosorbide mononitrate  30 mg Oral Daily  . lactulose  30 g Oral BID  . Melatonin  3 mg Oral QHS  . nystatin  5 mL Oral QID  . pantoprazole  40 mg Oral BID  . polyethylene glycol  17 g Oral Daily  . sodium chloride flush  3 mL Intravenous Q12H  . tamsulosin  0.4 mg Oral Daily  . traMADol  50 mg Oral Once   Continuous Infusions:   LOS:  12 days   Time spent: 25 minutes.   Vance Gather, MD [facility] Pager [phone]   If 7PM-7AM, please contact night-coverage [email or web address] Password [email or web address] 09/18/2017, 11:57 AM

## 2017-09-18 NOTE — Progress Notes (Signed)
Pt refused medications and assessment by RN  Primary RN sent second RN, Aniceto Boss, to complete assessment and medication pass. Pt still refused. Pt also refused CBG check, unable to give insulin. Will continue to monitor

## 2017-09-19 ENCOUNTER — Encounter (HOSPITAL_COMMUNITY): Payer: Self-pay

## 2017-09-19 LAB — BASIC METABOLIC PANEL
ANION GAP: 9 (ref 5–15)
BUN: 42 mg/dL — ABNORMAL HIGH (ref 6–20)
CALCIUM: 8.5 mg/dL — AB (ref 8.9–10.3)
CHLORIDE: 99 mmol/L — AB (ref 101–111)
CO2: 25 mmol/L (ref 22–32)
Creatinine, Ser: 1.81 mg/dL — ABNORMAL HIGH (ref 0.61–1.24)
GFR calc Af Amer: 40 mL/min — ABNORMAL LOW (ref >60)
GFR calc non Af Amer: 34 mL/min — ABNORMAL LOW (ref >60)
GLUCOSE: 280 mg/dL — AB (ref 65–99)
Potassium: 4.6 mmol/L (ref 3.5–5.1)
Sodium: 133 mmol/L — ABNORMAL LOW (ref 135–145)

## 2017-09-19 LAB — GLUCOSE, CAPILLARY
GLUCOSE-CAPILLARY: 293 mg/dL — AB (ref 65–99)
GLUCOSE-CAPILLARY: 221 mg/dL — AB (ref 65–99)
GLUCOSE-CAPILLARY: 219 mg/dL — AB (ref 65–99)
Glucose-Capillary: 312 mg/dL — ABNORMAL HIGH (ref 65–99)

## 2017-09-19 MED ORDER — MIRTAZAPINE 15 MG PO TABS
7.5000 mg | ORAL_TABLET | Freq: Every day | ORAL | Status: DC
  Administered 2017-09-19 – 2017-10-14 (×23): 7.5 mg via ORAL
  Filled 2017-09-19 (×26): qty 1

## 2017-09-19 MED ORDER — GABAPENTIN 100 MG PO CAPS
100.0000 mg | ORAL_CAPSULE | Freq: Every day | ORAL | Status: DC
  Administered 2017-09-19 – 2017-11-05 (×44): 100 mg via ORAL
  Filled 2017-09-19 (×48): qty 1

## 2017-09-19 MED ORDER — INSULIN GLARGINE 100 UNIT/ML ~~LOC~~ SOLN
10.0000 [IU] | Freq: Every day | SUBCUTANEOUS | Status: DC
  Administered 2017-09-19 – 2017-09-21 (×2): 10 [IU] via SUBCUTANEOUS
  Filled 2017-09-19 (×2): qty 0.1

## 2017-09-19 NOTE — Progress Notes (Signed)
PROGRESS NOTE  Shawn Pearson  OZH:[id number] DOB: [birth year] DOA: 09/05/2017 PCP: Mabeline Caras, NP   Brief Narrative: Brief Narrative:  Patient is 76 year old male with dementia, CK D, hypertension, medical noncompliance with medications, negative stress test in February 2018, admitted from skilled nursing facility with chest pain and uncontrolled blood pressure due to noncompliance with medications. He also report suicidal , homicidal thought. He was evaluated by psych. He was started on Depakote IV. Unable to use haldol due to prolong QT. He continue to refuse medication at times. Psych is recommending inpatient geropsychiatry facility.   Assessment & Plan:  Principal Problem:   Dementia Active Problems:   Hypertensive urgency   CKD stage 3 secondary to diabetes (HCC)   Chest pain   Noncompliance with medication regimen   Agitation   Elevated troponin  HTN with severe range HTN: Complicated by medication nonadherence. Recommend treating psychiatric illness in effort to minimize disruptions in care. - Continue po medications (imdur, coreg, norvasc), clonidine patch, and prn IV hydralazine  Chronic combined systolic and diastolic CHF: Appears euvolemic, due to poor per oral intake, we're holding lasix.  - Continue medications as above - Monitor I/O, daily weights which have been roughly stable.  Dementia, bipolar disorder with current suicidal and homicidal ideation: Per psychiatry recommendations (see addendum to 11/23 note on 11/27).  - Continue increased depakote to 125mg  BID, pt starting to take medications as prescribed. - Limit BZDs, anticholinergics as able - Continue melatonin per psychiatry recommendations, delirium precautions - No antipsychotics to be used due to QT prolongation (most recently QTc 544msec) - Continue suicide precautions, 1:1 sitter.  - Will start remeron 7.5mg  qHS for irritability and to stimulate appetite per psychiatry recommendations.  Chest  pain: Suspect esophagitis, improving, worse this morning with breakfast, since resolved..  - Continue PPI BID and diflucan for esophagitis.  - Cardiology consulted, recommend BP control, and no further invasive testing. Continue BP management as above  Esophagitis:  - Taking po. Continue diflucan course x14 days and PPI.   T2DM:  - Continue lantus and SSI if patient will allow. Remaining hyperglycemic but without gap or acidosis due to infrequently allowing CBG checks and SSI. Will increase lantus 6u > 10u and monitor.   CKD stage III: SCr slightly above presumed baseline (1.3-1.5) at 1.64 - Holding cozaar.  - Monitor BMP in AM  Noncompliance with medication regimen:  - Significantly impacting medical care.   BPH: UOP wnl with condom cath  - Continue flomax, finasteride  Iron deficiency anemia: Chronic, stable. Normocytic. FOBT neg.  - Monitor prn signs of bleeding or symptomatic anemia.    Stage III rectal adenocarcinoma: Sees GI at [facility], MD, drom discharge summary May 2018: "New dx Invasive Rectal adenocarcinoma. GI d/w colorectal surgeon Dr. Leland Johns, no surgery planned.No plans for further staging with EUS Patient evaluated by [facility] following and Radiation Oncology  It appears to be a clinical T3 rectal cancer with bleeding, no obvious metastases. Poor performance status with multiple significant comorbid conditions. He is not a candidate for systemic therapy and medically inoperable. Plan for palliative Radiation therapy for 5 days in an effort to at least stop bleeding, prevent/delay obstruction and to keep Mr. Hall out of the hospital. Dr. Dwaine Deter (radiation oncologist) at [facility] will be assuming pt's outpatient care and appointment will be made by transitional nurse Navigator." - No evidence of acute complication.   Abdominal pain: Resolved. KUB with constipation.  - Continue lactulose and miralax for constipation.  Stump pain:   - Started tramadol  PRN, restart neurontin at 100mg  qHS and monitor  DVT prophylaxis: Lovenox Code Status: Full code  Family Communication: None at bedside  Disposition Plan: Inpatient psychiatry when bed available. He remains medically stable for discharge.  Consultants:   Cardiology  Psychiatry   Procedures:  ECHO; Left ventricle: The cavity size was normal. There was moderate   concentric hypertrophy. Systolic function was mildly to   moderately reduced. The estimated ejection fraction was in the   range of 40% to 45%. Hypokineis in the inferoseptal and inferior   myocardium. Features are consistent with a pseudonormal left   ventricular filling pattern, with concomitant abnormal relaxation   and increased filling pressure (grade 2 diastolic dysfunction).   Doppler parameters are consistent with high ventricular filling   pressure. - Aortic valve: Transvalvular velocity was within the normal range.   There was no stenosis. There was no regurgitation. - Mitral valve: Transvalvular velocity was within the normal range.   There was no evidence for stenosis. There was mild regurgitation. - Left atrium: The atrium was moderately dilated. - Right ventricle: The cavity size was normal. Wall thickness was   normal. Systolic function was normal. - Right atrium: The atrium was moderately dilated. - Atrial septum: No defect or patent foramen ovale was identified   by color flow Doppler. - Tricuspid valve: There was mild regurgitation. - Pulmonary arteries: Systolic pressure was within the normal   range. PA peak pressure: 29 mm Hg (S).  Antimicrobials:  None  Subjective: Complaining of cramping and sharp pain at bilateral stump sites, stable for the past few days. Also had some mid upper chest discomfort with swallowing this morning that has resolved at time of my evaluation. No cough, no current chest pain, no dyspnea.   Objective: BP (!) 134/53 (BP Location: Right Arm)   Pulse 65   Temp 98 F  (36.7 C) (Oral)   Resp 18   Ht 6\' 2"  (1.88 m)   Wt 92.3 kg (203 lb 7.8 oz)   SpO2 98%   BMI 26.13 kg/m  Gen: Chronically ill-appearing 76yo male in NAD Pulm: Non-labored; CTAB, no wheezes  CV: Regular rate, no murmur appreciated, no JVD. No pain on chest wall palpation.  Rectal: Nonthrombosed external hemorrhoids without erythema, no fissure or abscess. No bleeding. 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 Normal sensation reported. Neuro: Tired but rousable to his baseline, oriented, CN II-XII without deficits Psych: +HI. Denies hallucinations, not responding to internal stimuli.  Data Reviewed: I have personally reviewed following labs and imaging studies  CBC: Recent Labs  Lab 09/14/17 0453  WBC 8.2  HGB 8.7*  HCT 26.5*  MCV 79.6  PLT 355   Basic Metabolic Panel: Recent Labs  Lab 09/14/17 0453 09/19/17 0427  NA 133* 133*  K 4.0 4.6  CL 101 99*  CO2 26 25  GLUCOSE 206* 280*  BUN 42* 42*  CREATININE 1.64* 1.81*  CALCIUM 8.1* 8.5*   Cardiac Enzymes: No results for input(s): CKTOTAL, CKMB, CKMBINDEX, TROPONINI in the last 168 hours. CBG: Recent Labs  Lab 09/16/17 2122 09/17/17 2037 09/18/17 2133 09/19/17 0731 09/19/17 1117  GLUCAP 250* 348* 261* 312* 293*    Radiology Studies: No results found.  Scheduled Meds: . amLODipine  10 mg Oral Daily  . aspirin EC  325 mg Oral Daily  . atorvastatin  40 mg Oral QHS  . carvedilol  12.5 mg Oral BID WC  .  cloNIDine  0.1 mg Transdermal Weekly  . divalproex  125 mg Oral Q12H  . enoxaparin (LOVENOX) injection  40 mg Subcutaneous Daily  . feeding supplement (ENSURE ENLIVE)  237 mL Oral QID  . finasteride  5 mg Oral Daily  . fluconazole  100 mg Oral Daily  . insulin aspart  0-9 Units Subcutaneous TID WC  . insulin glargine  6 Units Subcutaneous QHS  . isosorbide mononitrate  30 mg Oral Daily  . lactulose  30 g Oral BID  . Melatonin  3 mg Oral QHS  . nystatin  5 mL Oral QID  . pantoprazole  40  mg Oral BID  . polyethylene glycol  17 g Oral Daily  . sodium chloride flush  3 mL Intravenous Q12H  . tamsulosin  0.4 mg Oral Daily  . traMADol  50 mg Oral Once   Continuous Infusions:   LOS: 13 days   Time spent: 25 minutes.   Vance Gather, MD [facility] Pager [phone]   If 7PM-7AM, please contact night-coverage [email or web address] Password [email or web address] 09/19/2017, 12:13 PM

## 2017-09-19 NOTE — Progress Notes (Signed)
Dr. Bonner Puna paged and notified of patient's c/o chest pain.  Dr. Bonner Puna actually on the unit and spoke with patient. Patient shared same information with physician.  Patient declined to have EKG.  Physician aware, no new orders at this time. Patient has been sleeping this am, positioned on his right side. Offered to reposition for comfort and patient declined. Will monitor.

## 2017-09-20 ENCOUNTER — Encounter (HOSPITAL_COMMUNITY): Payer: Self-pay | Admitting: *Deleted

## 2017-09-20 LAB — GLUCOSE, CAPILLARY
GLUCOSE-CAPILLARY: 169 mg/dL — AB (ref 65–99)
GLUCOSE-CAPILLARY: 342 mg/dL — AB (ref 65–99)
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
## 2017-09-20 NOTE — Progress Notes (Signed)
Pt refused all AM medications.  MD is aware.

## 2017-09-20 NOTE — Progress Notes (Signed)
Patient called nurse to room, stated "I need a big enema the little one didn't work, my stomach hurt" patient then refused scheduled meds including lactulose after being told lactulose is a laxative. Patient refused scheduled meds, vitals and cbg, cursing at the nursing staff and sitter.

## 2017-09-20 NOTE — Plan of Care (Signed)
  Health Behavior/Discharge Planning: Ability to manage health-related needs will improve Description Pt. Non-compliant. Will not answer questions or engage in conversation with RN about health.  Pt non-compliant with medication regimen. 09/20/2017 4175 - Not Progressing by Imagene Gurney, RN

## 2017-09-20 NOTE — Progress Notes (Signed)
Attempted to call Ms. Melucci (spouse) to inform her that pt is being transferred from 3E10 to 5W04, however, there was no answer, and her VM was full.  Unable to leave VM.

## 2017-09-20 NOTE — Progress Notes (Signed)
PROGRESS NOTE  Shawn Pearson  VEL:[id number] DOB: [birth year] DOA: 09/05/2017   PCP: Shawn Caras, NP   Brief Narrative: Brief Narrative:  Patient is 76 year old male with dementia, CK D, hypertension, medical noncompliance with medications, negative stress test in February 2018, admitted from skilled nursing facility with chest pain and uncontrolled blood pressure due to noncompliance with medications. He also report suicidal , homicidal thought. He was evaluated by psych. He was started on Depakote IV. Unable to use haldol due to prolong QT. He continue to refuse medication at times. Psych is recommending inpatient geropsychiatry facility.   Assessment & Plan:  Principal Problem:   Dementia Active Problems:   Hypertensive urgency   CKD stage 3 secondary to diabetes (HCC)   Chest pain   Noncompliance with medication regimen   Agitation   Elevated troponin  HTN with severe range HTN: Complicated by medication nonadherence. Recommend treating psychiatric illness in effort to minimize disruptions in care. - BP overall better - continue PO Imdur, Norvacs, Coreg,clonidine patch   Chronic combined systolic and diastolic CHF: Appears euvolemic, due to poor per oral intake - holding lasix  - meds as above - daily weights - weight trend in the past 72 hours  Dementia, bipolar disorder with current suicidal and homicidal ideation: Per psychiatry recommendations (see addendum to 11/23 note on 11/27).  - Continue increased depakote to 125mg  BID, pt starting to take medications as prescribed. - Limit BZDs, anticholinergics as able - Continue melatonin per psychiatry recommendations, delirium precautions - no antipsychotics due to QT prolongation - suicide precautions, sitter at bedside  - remeron started 7.5 mg pO QHS  Chest pain: Suspect esophagitis, improving, worse this morning with breakfast, since resolved..  - continue PPU BID and diflucan  - per cardiology, recommended BP  control and no further invasive testing   Esophagitis:  - taking Diflucan and PPI - will need total 14 days course   T2DM:  - Continue lantus and SSI if patient will allow. Remaining hyperglycemic but without gap or acidosis due to infrequently allowing CBG checks and SSI. Will increase lantus 6u > 10u and monitor.   CKD stage III: SCr slightly above presumed baseline (1.3-1.5) at 1.64 - continue holding Cozaar - BMP In AM  Noncompliance with medication regimen:  - Significantly impacting medical care.   BPH: UOP wnl with condom cath  - continue flomax, finasteride  Iron deficiency anemia: Chronic, stable. Normocytic. FOBT neg.  - no signs of active bleeding  - CBC in AM  Stage III rectal adenocarcinoma: Sees GI at [facility], MD, drom discharge summary May 2018: "New dx Invasive Rectal adenocarcinoma. GI d/w colorectal surgeon Dr. Leland Johns, no surgery planned.No plans for further staging with EUS Patient evaluated by [facility] following and Radiation Oncology  It appears to be a clinical T3 rectal cancer with bleeding, no obvious metastases. Poor performance status with multiple significant comorbid conditions. He is not a candidate for systemic therapy and medically inoperable. Plan for palliative Radiation therapy for 5 days in an effort to at least stop bleeding, prevent/delay obstruction and to keep [name] out of the hospital. Dr. Dwaine Deter (radiation oncologist) at [facility] will be assuming pt's outpatient care and appointment will be made by transitional nurse Navigator." - no acute complications   Abdominal pain: Resolved. KUB with constipation.  - Continue lactulose and miralax for constipation.  Stump pain:   - Started tramadol PRN, restart neurontin at 100mg  qHS and monitor  DVT prophylaxis: Lovenox Code Status:  Full code  Family Communication: None at bedside  Disposition Plan: Inpatient psychiatry when bed available. He remains medically stable for  discharge.  Consultants:   Cardiology  Psychiatry   Procedures:  ECHO; Left ventricle: The cavity size was normal. There was moderate   concentric hypertrophy. Systolic function was mildly to   moderately reduced. The estimated ejection fraction was in the   range of 40% to 45%. Hypokineis in the inferoseptal and inferior   myocardium. Features are consistent with a pseudonormal left   ventricular filling pattern, with concomitant abnormal relaxation   and increased filling pressure (grade 2 diastolic dysfunction).   Doppler parameters are consistent with high ventricular filling   pressure. - Aortic valve: Transvalvular velocity was within the normal range.   There was no stenosis. There was no regurgitation. - Mitral valve: Transvalvular velocity was within the normal range.   There was no evidence for stenosis. There was mild regurgitation. - Left atrium: The atrium was moderately dilated. - Right ventricle: The cavity size was normal. Wall thickness was   normal. Systolic function was normal. - Right atrium: The atrium was moderately dilated. - Atrial septum: No defect or patent foramen ovale was identified   by color flow Doppler. - Tricuspid valve: There was mild regurgitation. - Pulmonary arteries: Systolic pressure was within the normal   range. PA peak pressure: 29 mm Hg (S).  Antimicrobials:  None  Subjective: No events overnight.   Objective: BP (!) 134/53 (BP Location: Right Arm)   Pulse 65   Temp 98 F (36.7 C) (Oral)   Resp 18   Ht 6\' 2"  (1.88 m)   Wt 93.5 kg (206 lb 2.1 oz)   SpO2 98%   BMI 26.47 kg/m   Physical Exam  Constitutional: Appears calm, NAD CVS: RRR, S1/S2 +, no murmurs, no gallops, no carotid bruit.  Pulmonary: Effort and breath sounds normal, no stridor, rhonchi, wheezes, rales.  Abdominal: Soft. BS +,  no distension, tenderness, rebound or guarding.  Musculoskeletal: B BKA  Data Reviewed: I have personally reviewed following labs  and imaging studies  CBC: Recent Labs  Lab 09/14/17 0453  WBC 8.2  HGB 8.7*  HCT 26.5*  MCV 79.6  PLT 161   Basic Metabolic Panel: Recent Labs  Lab 09/14/17 0453 09/19/17 0427  NA 133* 133*  K 4.0 4.6  CL 101 99*  CO2 26 25  GLUCOSE 206* 280*  BUN 42* 42*  CREATININE 1.64* 1.81*  CALCIUM 8.1* 8.5*   CBG: Recent Labs  Lab 09/18/17 2133 09/19/17 0731 09/19/17 1117 09/19/17 1626 09/19/17 2216  GLUCAP 261* 312* 293* 221* 219*    Radiology Studies: No results found.  Scheduled Meds: . amLODipine  10 mg Oral Daily  . aspirin EC  325 mg Oral Daily  . atorvastatin  40 mg Oral QHS  . carvedilol  12.5 mg Oral BID WC  . cloNIDine  0.1 mg Transdermal Weekly  . divalproex  125 mg Oral Q12H  . enoxaparin (LOVENOX) injection  40 mg Subcutaneous Daily  . feeding supplement (ENSURE ENLIVE)  237 mL Oral QID  . finasteride  5 mg Oral Daily  . fluconazole  100 mg Oral Daily  . gabapentin  100 mg Oral QHS  . insulin aspart  0-9 Units Subcutaneous TID WC  . insulin glargine  10 Units Subcutaneous QHS  . isosorbide mononitrate  30 mg Oral Daily  . lactulose  30 g Oral BID  . Melatonin  3 mg Oral  QHS  . mirtazapine  7.5 mg Oral QHS  . nystatin  5 mL Oral QID  . pantoprazole  40 mg Oral BID  . polyethylene glycol  17 g Oral Daily  . sodium chloride flush  3 mL Intravenous Q12H  . tamsulosin  0.4 mg Oral Daily  . traMADol  50 mg Oral Once   Continuous Infusions:   LOS: 14 days   Time spent: 25 minutes.   Faye Ramsay, MD [facility] Pager [phone]  If 7PM-7AM, please contact night-coverage [email or web address] Password [facility] 09/20/2017, 7:39 AM

## 2017-09-21 DIAGNOSIS — Z794 Long term (current) use of insulin: Secondary | ICD-10-CM

## 2017-09-21 LAB — GLUCOSE, CAPILLARY
GLUCOSE-CAPILLARY: 247 mg/dL — AB (ref 65–99)
GLUCOSE-CAPILLARY: 291 mg/dL — AB (ref 65–99)
GLUCOSE-CAPILLARY: 373 mg/dL — AB (ref 65–99)
Glucose-Capillary: 379 mg/dL — ABNORMAL HIGH (ref 65–99)
Glucose-Capillary: 286 mg/dL — ABNORMAL HIGH (ref 65–99)
Glucose-Capillary: 285 mg/dL — ABNORMAL HIGH (ref 65–99)

## 2017-09-21 MED ORDER — INSULIN GLARGINE 100 UNIT/ML ~~LOC~~ SOLN
15.0000 [IU] | Freq: Every day | SUBCUTANEOUS | Status: DC
  Administered 2017-09-21 – 2017-09-30 (×10): 15 [IU] via SUBCUTANEOUS
  Filled 2017-09-21 (×10): qty 0.15

## 2017-09-21 MED ORDER — GLUCERNA SHAKE PO LIQD
237.0000 mL | Freq: Three times a day (TID) | ORAL | Status: DC
  Administered 2017-09-21 – 2017-11-03 (×122): 237 mL via ORAL
  Filled 2017-09-21 (×14): qty 237

## 2017-09-21 MED ORDER — FLUCONAZOLE 100 MG PO TABS
100.0000 mg | ORAL_TABLET | Freq: Every day | ORAL | Status: DC
  Administered 2017-09-22 – 2017-09-28 (×7): 100 mg via ORAL
  Filled 2017-09-21 (×7): qty 1

## 2017-09-21 NOTE — Progress Notes (Signed)
Patient was given enema with small solid stool noted. Patient allowed care nurse to check cbg and temp around midnight and also took scheduled medications. Refused labs this am after two attempts.

## 2017-09-21 NOTE — Progress Notes (Signed)
Nutrition Follow-up  DOCUMENTATION CODES:   Obesity unspecified  INTERVENTION:  Ensure Enlive po QID, each supplement provides 350 kcal and 20 grams of protein  NUTRITION DIAGNOSIS:   Inadequate oral intake related to lethargy/confusion as evidenced by meal completion < 25%. -ongoing  GOAL:   Patient will meet greater than or equal to 90% of their needs -not meeting currently  MONITOR:   PO intake, Supplement acceptance, Labs, Weight trends, Skin, I & O's  REASON FOR ASSESSMENT:   Malnutrition Screening Tool    ASSESSMENT:   Patient is a 76 year old male with past medical history significant for dementia, CKD, HTN , medical noncompliance wth meds, neg stress test in 2/18. Patient was admitted from SNF with chest pain, uncontrolled blood pressure due to non compliance with medication and headache.  He says he does not like the nursing home he is at and that is why he refuses to take his meds. The patient has been non co operative with the Nursing staff, and still refusing his medication. Psych has been consulted as patient likely has dementia with behavioral problems.   11/21- initial psych consult completed  11/28 - Psych tried to follow-up, patient continues to be non-compliant  Per chart review patient refusing medications, ;lab draws, unwilling to talk with staff. Being abusive to staff. Meal completion 50-100% last 3 meals. Ensure consumption varies, drank 1 for the last 2 days but prior to that, drank ensure QID  Pt remains poor oral intake and would continue to benefit from nutrient dense supplement. One Ensure Enlive supplement provides 350 kcals, 20 grams protein, and 44-45 grams of carbohydrate vs one Glucerna shake supplement, which provides 220 kcals, 10 grams of protein, and 26 grams of carbohydrate. Given pt's hx of DM, RD will continue to monitor PO intake, CBGS, and adjust supplement regimen as appropriate -refusing medications and lab draws, including insulin  at times -Currently ordered for 0-9 units Novolog TID, Lantus 10 units HS  Labs reviewed:  CBGs 247, 291, 342  Medications reviewed and include:  Insulin, Lactulose, Remeron  Diet Order:  Diet Heart Room service appropriate? Yes; Fluid consistency: Thin  EDUCATION NEEDS:   Not appropriate for education at this time  Skin:  Skin Assessment: Reviewed RN Assessment  Last BM:  09/21/2017  Height:   Ht Readings from Last 1 Encounters:  09/06/17 6\' 2"  (1.88 m)    Weight:   Wt Readings from Last 1 Encounters:  09/21/17 211 lb 10.3 oz (96 kg)    Ideal Body Weight:  75.1 kg  BMI:  Body mass index is 27.17 kg/m.  Estimated Nutritional Needs:   Kcal:  1650-1850  Protein:  80-95 grams  Fluid:  1.6-1.8 L  Shawn Anis. Wynetta Seith, MS, RD LDN Inpatient Clinical Dietitian Pager [phone]

## 2017-09-21 NOTE — Progress Notes (Signed)
CSW confirmed that patient is still on [facility] waiting list.   Shawn Pearson [phone]

## 2017-09-21 NOTE — Progress Notes (Signed)
After patient received his scheduled BP medicine, his BP was still elevated 163/59. Patient received Hydralazine 20 mg IV per PRN order and BP came down to 130/63. Will continue to monitor.

## 2017-09-21 NOTE — Care Management Important Message (Signed)
Important Message  Patient Details  Name: Shawn Pearson MRN: [id number] Date of Birth: 03/23/1941   Medicare Important Message Given:  Yes    [name] May 09/21/2017, 10:48 AM

## 2017-09-21 NOTE — Progress Notes (Signed)
PROGRESS NOTE    Toma Arts  KGU:[id number] DOB: 1940/12/07 DOA: 09/05/2017 PCP: Mabeline Caras, NP   Brief Narrative: Burlie Cajamarca is a 76 y.o. male with dementia, CK D, hypertension, medical noncompliance with medications, negative stress test in February 2018, admitted from skilled nursing facility with chest pain and uncontrolled blood pressure due to noncompliance with medications. He also report suicidal , homicidal thought. He was evaluated by psych. He was started on Depakote IV. Unable to use haldol due to prolong QT. He continue to refuse medication at times. Psych is recommending inpatient geropsychiatry facility.      Assessment & Plan:   Principal Problem:   Dementia Active Problems:   Hypertensive urgency   CKD stage 3 secondary to diabetes ([place])   Chest pain   Noncompliance with medication regimen   Agitation   Elevated troponin   Essential hypertension Resolved. -Continue Imdur, Norvasc, Coreg, Clonidine  Chronic combined systolic and diastolic CHF Euvolemic. -continue to hold lasix -daily weight/in and out  Dementia Bipolar disorder Suicidal/homocidal ideation -inpatient psych on discharge -Continue Depakote, melatonin, Remeron -Suicide precautions  Esophagitis -Continue Diflucan (14 day course) and PPI  Type 2 diabetes mellitus Uncontrolled -Increase to Lantus 15 units -Continue SSI  CKD stage III Stable GFR, creatinine trended up slightly  Non-compliance  BPH -Continue Flomax and finasteride  Iron deficiency anemia Chronic and stable.  Abdominal pain Resolved.  Stump pain -continue tramadol and neurontin  Stage III rectal adenocarcinoma: Sees GI at [facility], MD, drom discharge summary May 2018: "New dx Invasive Rectal adenocarcinoma. GI d/w colorectal surgeon Dr. Leland Johns, no surgery planned.No plans for further staging with EUS Patient evaluated by [facility] following and Radiation Oncology  It appears to be a  clinical T3 rectal cancer with bleeding, no obvious metastases. Poor performance status with multiple significant comorbid conditions. He is not a candidate for systemic therapy and medically inoperable. Plan for palliative Radiation therapy for 5 days in an effort to at least stop bleeding, prevent/delay obstruction and to keep Mr. Nix out of the hospital. Dr. Dwaine Deter (radiation oncologist) at [facility] will be assuming pt's outpatient care and appointment will be made by transitional nurse Navigator." - no acute complications     DVT prophylaxis: Lovenox Code Status: Full code Family Communication: None at bedside Disposition Plan: Discharge to inpatient psych   Consultants:   Psychiatry  Cardiology  Procedures:    Echocardiogram (09/01/2017) Study Conclusions  - Left ventricle: The cavity size was normal. There was moderate   concentric hypertrophy. Systolic function was mildly to   moderately reduced. The estimated ejection fraction was in the   range of 40% to 45%. Hypokineis in the inferoseptal and inferior   myocardium. Features are consistent with a pseudonormal left   ventricular filling pattern, with concomitant abnormal relaxation   and increased filling pressure (grade 2 diastolic dysfunction).   Doppler parameters are consistent with high ventricular filling   pressure. - Aortic valve: Transvalvular velocity was within the normal range.   There was no stenosis. There was no regurgitation. - Mitral valve: Transvalvular velocity was within the normal range.   There was no evidence for stenosis. There was mild regurgitation. - Left atrium: The atrium was moderately dilated. - Right ventricle: The cavity size was normal. Wall thickness was   normal. Systolic function was normal. - Right atrium: The atrium was moderately dilated. - Atrial septum: No defect or patent foramen ovale was identified   by color flow Doppler. - Tricuspid  valve: There was mild  regurgitation. - Pulmonary arteries: Systolic pressure was within the normal   range. PA peak pressure: 29 mm Hg (S).   Antimicrobials:  Diflucan    Subjective: No overnight events  Objective: Vitals:   09/19/17 1100 09/20/17 0605 09/20/17 1056 09/21/17 0700  BP: (!) 134/53  (!) 163/55   Pulse: 65  60   Resp: 18  16   Temp: 98 F (36.7 C)  98.7 F (37.1 C)   TempSrc: Oral  Oral   SpO2: 98%  94%   Weight:  93.5 kg (206 lb 2.1 oz) 94.8 kg (208 lb 14.4 oz) 96 kg (211 lb 10.3 oz)  Height:        Intake/Output Summary (Last 24 hours) at 09/21/2017 1337 Last data filed at 09/21/2017 0950 Gross per 24 hour  Intake 2240 ml  Output 2040 ml  Net 200 ml   Filed Weights   09/20/17 0605 09/20/17 1056 09/21/17 0700  Weight: 93.5 kg (206 lb 2.1 oz) 94.8 kg (208 lb 14.4 oz) 96 kg (211 lb 10.3 oz)    Examination:  General exam: Appears calm and comfortable Respiratory system: Respiratory effort normal. Central nervous system: Alert and oriented. No focal neurological deficits. Extremities: No edema. No calf tenderness. Bilateral BKA    Data Reviewed: I have personally reviewed following labs and imaging studies  CBC: No results for input(s): WBC, NEUTROABS, HGB, HCT, MCV, PLT in the last 168 hours. Basic Metabolic Panel: Recent Labs  Lab 09/19/17 0427  NA 133*  K 4.6  CL 99*  CO2 25  GLUCOSE 280*  BUN 42*  CREATININE 1.81*  CALCIUM 8.5*   GFR: Estimated Creatinine Clearance: 39.7 mL/min (A) (by C-G formula based on SCr of 1.81 mg/dL (H)). Liver Function Tests: No results for input(s): AST, ALT, ALKPHOS, BILITOT, PROT, ALBUMIN in the last 168 hours. No results for input(s): LIPASE, AMYLASE in the last 168 hours. No results for input(s): AMMONIA in the last 168 hours. Coagulation Profile: No results for input(s): INR, PROTIME in the last 168 hours. Cardiac Enzymes: No results for input(s): CKTOTAL, CKMB, CKMBINDEX, TROPONINI in the last 168 hours. BNP (last 3  results) No results for input(s): PROBNP in the last 8760 hours. HbA1C: No results for input(s): HGBA1C in the last 72 hours. CBG: Recent Labs  Lab 09/20/17 1211 09/20/17 1740 09/21/17 0006 09/21/17 0809 09/21/17 1206  GLUCAP 379* 342* 291* 247* 373*   Lipid Profile: No results for input(s): CHOL, HDL, LDLCALC, TRIG, CHOLHDL, LDLDIRECT in the last 72 hours. Thyroid Function Tests: No results for input(s): TSH, T4TOTAL, FREET4, T3FREE, THYROIDAB in the last 72 hours. Anemia Panel: No results for input(s): VITAMINB12, FOLATE, FERRITIN, TIBC, IRON, RETICCTPCT in the last 72 hours. Sepsis Labs: No results for input(s): PROCALCITON, LATICACIDVEN in the last 168 hours.  No results found for this or any previous visit (from the past 240 hour(s)).       Radiology Studies: No results found.      Scheduled Meds: . amLODipine  10 mg Oral Daily  . aspirin EC  325 mg Oral Daily  . atorvastatin  40 mg Oral QHS  . carvedilol  12.5 mg Oral BID WC  . cloNIDine  0.1 mg Transdermal Weekly  . divalproex  125 mg Oral Q12H  . enoxaparin (LOVENOX) injection  40 mg Subcutaneous Daily  . feeding supplement (GLUCERNA SHAKE)  237 mL Oral TID BM  . finasteride  5 mg Oral Daily  . fluconazole  100 mg Oral Daily  . gabapentin  100 mg Oral QHS  . insulin aspart  0-9 Units Subcutaneous TID WC  . insulin glargine  10 Units Subcutaneous QHS  . isosorbide mononitrate  30 mg Oral Daily  . lactulose  30 g Oral BID  . Melatonin  3 mg Oral QHS  . mirtazapine  7.5 mg Oral QHS  . nystatin  5 mL Oral QID  . pantoprazole  40 mg Oral BID  . polyethylene glycol  17 g Oral Daily  . sodium chloride flush  3 mL Intravenous Q12H  . tamsulosin  0.4 mg Oral Daily  . traMADol  50 mg Oral Once   Continuous Infusions:   LOS: 15 days     Cordelia Poche, MD [facility] 09/21/2017, 1:37 PM Pager: [phone]  If 7PM-7AM, please contact night-coverage [email or web address] Password [email or web address] 09/21/2017,  1:37 PM

## 2017-09-22 LAB — GLUCOSE, CAPILLARY
Glucose-Capillary: 165 mg/dL — ABNORMAL HIGH (ref 65–99)
Glucose-Capillary: 207 mg/dL — ABNORMAL HIGH (ref 65–99)
Glucose-Capillary: 210 mg/dL — ABNORMAL HIGH (ref 65–99)
Glucose-Capillary: 197 mg/dL — ABNORMAL HIGH (ref 65–99)

## 2017-09-22 NOTE — Progress Notes (Signed)
CSW confirmed that patient is on [facility]. He is not able to be listed as priority because they are so full per RN, Pamala Hurry. They will keep CSW updated.  Percell Locus Kaytie Ratcliffe LCSWA [phone]

## 2017-09-22 NOTE — Progress Notes (Signed)
Patient was complaining of headache and chest pain 4/10. BP 161/58; MAP 79; P 63. Per PRN order patient received Tramadol 50 mg po and Hydralazine 20 mg IV. When rechecked, patient was sleeping and his BP came down to 143/59. Will continue to monitor.

## 2017-09-22 NOTE — Progress Notes (Signed)
PROGRESS NOTE    Shawn Pearson  DXI:[id number] DOB: 08/14/1941 DOA: 09/05/2017 PCP: Shawn Caras, NP   Brief Narrative: Shawn Pearson is a 76 y.o. male with dementia, CK D, hypertension, medical noncompliance with medications, negative stress test in February 2018, admitted from skilled nursing facility with chest pain and uncontrolled blood pressure due to noncompliance with medications. He also report suicidal , homicidal thought. He was evaluated by psych. He was started on Depakote IV. Unable to use haldol due to prolong QT. He continue to refuse medication at times. Psych is recommending inpatient geropsychiatry facility.      Assessment & Plan:   Principal Problem:   Dementia Active Problems:   Hypertensive urgency   CKD stage 3 secondary to diabetes ([place])   Chest pain   Noncompliance with medication regimen   Agitation   Elevated troponin   Essential hypertension Slightly ncontrolled. -Continue Imdur, Norvasc, Coreg, Clonidine  Chronic combined systolic and diastolic CHF Euvolemic. -continue to hold lasix -daily weight/in and out  Dementia Bipolar disorder Suicidal/homocidal ideation -inpatient psych on discharge -Continue Depakote, melatonin, Remeron -Suicide precautions  Esophagitis -Continue Diflucan (14 day course) and PPI  Chest pain Reproducible over pacer. No fluctuance felt. No overlying erythema. -Tramadol prn  Type 2 diabetes mellitus Uncontrolled -Increase to Lantus 15 units -Continue SSI  CKD stage III Stable GFR, creatinine trended up slightly  Non-compliance  BPH -Continue Flomax and finasteride  Iron deficiency anemia Chronic and stable.  Abdominal pain Resolved.  Stump pain -continue tramadol and neurontin  Stage III rectal adenocarcinoma: Sees GI at [facility], MD, drom discharge summary May 2018: "New dx Invasive Rectal adenocarcinoma. GI d/w colorectal surgeon Dr. Leland Pearson, no surgery planned.No plans for  further staging with EUS Patient evaluated by [facility] following and Radiation Oncology  It appears to be a clinical T3 rectal cancer with bleeding, no obvious metastases. Poor performance status with multiple significant comorbid conditions. He is not a candidate for systemic therapy and medically inoperable. Plan for palliative Radiation therapy for 5 days in an effort to at least stop bleeding, prevent/delay obstruction and to keep Mr. Shawn Pearson out of the hospital. Dr. Dwaine Pearson (radiation oncologist) at [facility] will be assuming pt's outpatient care and appointment will be made by transitional nurse Navigator." - no acute complications     DVT prophylaxis: Lovenox Code Status: Full code Family Communication: None at bedside Disposition Plan: Discharge to inpatient psych   Consultants:   Psychiatry  Cardiology  Procedures:    Echocardiogram (09/01/2017) Study Conclusions  - Left ventricle: The cavity size was normal. There was moderate   concentric hypertrophy. Systolic function was mildly to   moderately reduced. The estimated ejection fraction was in the   range of 40% to 45%. Hypokineis in the inferoseptal and inferior   myocardium. Features are consistent with a pseudonormal left   ventricular filling pattern, with concomitant abnormal relaxation   and increased filling pressure (grade 2 diastolic dysfunction).   Doppler parameters are consistent with high ventricular filling   pressure. - Aortic valve: Transvalvular velocity was within the normal range.   There was no stenosis. There was no regurgitation. - Mitral valve: Transvalvular velocity was within the normal range.   There was no evidence for stenosis. There was mild regurgitation. - Left atrium: The atrium was moderately dilated. - Right ventricle: The cavity size was normal. Wall thickness was   normal. Systolic function was normal. - Right atrium: The atrium was moderately dilated. - Atrial septum: No  defect or patent foramen ovale was identified   by color flow Doppler. - Tricuspid valve: There was mild regurgitation. - Pulmonary arteries: Systolic pressure was within the normal   range. PA peak pressure: 29 mm Hg (S).   Antimicrobials:  Diflucan    Subjective: Headache, chest pain, stump pain.  Objective: Vitals:   09/21/17 2106 09/22/17 0445 09/22/17 1055 09/22/17 1153  BP: (!) 140/55 (!) 138/53 (!) 161/58 (!) 143/59  Pulse: 70 68 64 63  Resp: 18 19 18 17   Temp: 98.3 F (36.8 C) 98.1 F (36.7 C)    TempSrc: Oral Oral    SpO2: 97% 98% 97% 99%  Weight:  95.6 kg (210 lb 12.2 oz)    Height:        Intake/Output Summary (Last 24 hours) at 09/22/2017 1322 Last data filed at 09/22/2017 0947 Gross per 24 hour  Intake 1083 ml  Output 1200 ml  Net -117 ml   Filed Weights   09/20/17 1056 09/21/17 0700 09/22/17 0445  Weight: 94.8 kg (208 lb 14.4 oz) 96 kg (211 lb 10.3 oz) 95.6 kg (210 lb 12.2 oz)    Examination:  General exam: Appears calm and comfortable Respiratory system: Respiratory effort normal. Central nervous system: Alert and oriented. No focal neurological deficits. Musculoskeletal: reproducible pain over pacer. Skin: no erythema over pacer. No fluctuance. Extremities: No edema. No calf tenderness. Bilateral BKA    Data Reviewed: I have personally reviewed following labs and imaging studies  CBC: No results for input(s): WBC, NEUTROABS, HGB, HCT, MCV, PLT in the last 168 hours. Basic Metabolic Panel: Recent Labs  Lab 09/19/17 0427  NA 133*  K 4.6  CL 99*  CO2 25  GLUCOSE 280*  BUN 42*  CREATININE 1.81*  CALCIUM 8.5*   GFR: Estimated Creatinine Clearance: 39.7 mL/min (A) (by C-G formula based on SCr of 1.81 mg/dL (H)). Liver Function Tests: No results for input(s): AST, ALT, ALKPHOS, BILITOT, PROT, ALBUMIN in the last 168 hours. No results for input(s): LIPASE, AMYLASE in the last 168 hours. No results for input(s): AMMONIA in the last 168  hours. Coagulation Profile: No results for input(s): INR, PROTIME in the last 168 hours. Cardiac Enzymes: No results for input(s): CKTOTAL, CKMB, CKMBINDEX, TROPONINI in the last 168 hours. BNP (last 3 results) No results for input(s): PROBNP in the last 8760 hours. HbA1C: No results for input(s): HGBA1C in the last 72 hours. CBG: Recent Labs  Lab 09/21/17 1206 09/21/17 1655 09/21/17 2117 09/22/17 0753 09/22/17 1226  GLUCAP 373* 286* 285* 165* 207*   Lipid Profile: No results for input(s): CHOL, HDL, LDLCALC, TRIG, CHOLHDL, LDLDIRECT in the last 72 hours. Thyroid Function Tests: No results for input(s): TSH, T4TOTAL, FREET4, T3FREE, THYROIDAB in the last 72 hours. Anemia Panel: No results for input(s): VITAMINB12, FOLATE, FERRITIN, TIBC, IRON, RETICCTPCT in the last 72 hours. Sepsis Labs: No results for input(s): PROCALCITON, LATICACIDVEN in the last 168 hours.  No results found for this or any previous visit (from the past 240 hour(s)).       Radiology Studies: No results found.      Scheduled Meds: . amLODipine  10 mg Oral Daily  . aspirin EC  325 mg Oral Daily  . atorvastatin  40 mg Oral QHS  . carvedilol  12.5 mg Oral BID WC  . cloNIDine  0.1 mg Transdermal Weekly  . divalproex  125 mg Oral Q12H  . enoxaparin (LOVENOX) injection  40 mg Subcutaneous Daily  . feeding supplement (  GLUCERNA SHAKE)  237 mL Oral TID BM  . finasteride  5 mg Oral Daily  . fluconazole  100 mg Oral Daily  . gabapentin  100 mg Oral QHS  . insulin aspart  0-9 Units Subcutaneous TID WC  . insulin glargine  15 Units Subcutaneous QHS  . isosorbide mononitrate  30 mg Oral Daily  . lactulose  30 g Oral BID  . Melatonin  3 mg Oral QHS  . mirtazapine  7.5 mg Oral QHS  . nystatin  5 mL Oral QID  . pantoprazole  40 mg Oral BID  . polyethylene glycol  17 g Oral Daily  . sodium chloride flush  3 mL Intravenous Q12H  . tamsulosin  0.4 mg Oral Daily  . traMADol  50 mg Oral Once    Continuous Infusions:   LOS: 16 days     Cordelia Poche, MD [facility] 09/22/2017, 1:22 PM Pager: [phone]  If 7PM-7AM, please contact night-coverage [email or web address] Password [email or web address] 09/22/2017, 1:22 PM

## 2017-09-22 NOTE — Consult Note (Signed)
Attempted to see patient today but he would not participate in the interview. Will attempt to see patient later this week.   Buford Dresser, DO

## 2017-09-23 DIAGNOSIS — R4587 Impulsiveness: Secondary | ICD-10-CM

## 2017-09-23 DIAGNOSIS — Z9114 Patient's other noncompliance with medication regimen: Secondary | ICD-10-CM

## 2017-09-23 LAB — GLUCOSE, CAPILLARY
Glucose-Capillary: 162 mg/dL — ABNORMAL HIGH (ref 65–99)
Glucose-Capillary: 269 mg/dL — ABNORMAL HIGH (ref 65–99)
Glucose-Capillary: 255 mg/dL — ABNORMAL HIGH (ref 65–99)
Glucose-Capillary: 272 mg/dL — ABNORMAL HIGH (ref 65–99)

## 2017-09-23 LAB — BASIC METABOLIC PANEL
Anion gap: 8 (ref 5–15)
BUN: 41 mg/dL — AB (ref 6–20)
CALCIUM: 8.4 mg/dL — AB (ref 8.9–10.3)
CHLORIDE: 98 mmol/L — AB (ref 101–111)
CO2: 26 mmol/L (ref 22–32)
CREATININE: 2.03 mg/dL — AB (ref 0.61–1.24)
GFR calc Af Amer: 35 mL/min — ABNORMAL LOW (ref >60)
GFR calc non Af Amer: 30 mL/min — ABNORMAL LOW (ref >60)
GLUCOSE: 205 mg/dL — AB (ref 65–99)
Potassium: 4.3 mmol/L (ref 3.5–5.1)
Sodium: 132 mmol/L — ABNORMAL LOW (ref 135–145)

## 2017-09-23 MED ORDER — FAMOTIDINE 20 MG PO TABS
20.0000 mg | ORAL_TABLET | Freq: Every day | ORAL | Status: DC
  Administered 2017-09-23 – 2017-11-05 (×39): 20 mg via ORAL
  Filled 2017-09-23 (×44): qty 1

## 2017-09-23 NOTE — Consult Note (Signed)
[facility] Psych Consult Progress Note  09/23/2017 4:16 PM Wendell Nicoson  MRN:  [id number] Subjective:   Mr. Joos was asleep prior to interview. He shook his head no when asked how he was doing. He reports, "I'm hungry. I need something to eat." He would not engage in conversation further when asked what he would like to eat. His sitter reports that he ate some of his lunch but let the majority of it untouched.   Principal Problem: Dementia Diagnosis:   Patient Active Problem List   Diagnosis Date Noted  . Elevated troponin [R74.8]   . Agitation [R45.1]   . Dementia [F03.90] 09/06/2017  . Noncompliance with medication regimen [Z91.14] 09/06/2017  . Anemia [D64.9] 08/30/2017  . Hypertension [I10] 08/30/2017  . Hypertensive urgency [I16.0] 08/21/2017  . DM2 (diabetes mellitus, type 2) ([place]) [E11.9] 08/21/2017  . CKD stage 3 secondary to diabetes ([place]) [X10.62, N18.3] 08/21/2017  . 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 Hypertensive emergency [I16.1] 08/21/2017  . Chest pain [R07.9]    Total Time spent with patient: 15 minutes  Past Psychiatric History: Dementia   Past Medical History:  Past Medical History:  Diagnosis Date  . Aortic atherosclerosis ([place])   . Dementia   . Diabetes mellitus without complication ([place])   . History of angina   . Hypertension   . Renal disorder    CKD stage 3    Past Surgical History:  Procedure Laterality Date  . BELOW KNEE LEG AMPUTATION Bilateral    Family History: History reviewed. No pertinent family history. Family Psychiatric  History: Unknown  Social History:  Social History   Substance and Sexual Activity  Alcohol Use No     Social History   Substance and Sexual Activity  Drug Use No    Social History   Socioeconomic History  . Marital status: Widowed    Spouse name: None  . Number of children: None  . Years of education: None  . Highest education level: None  Social Needs  . Financial resource  strain: None  . Food insecurity - worry: None  . Food insecurity - inability: None  . Transportation needs - medical: None  . Transportation needs - non-medical: None  Occupational History  . None  Tobacco Use  . Smoking status: Never Smoker  . Smokeless tobacco: Never Used  Substance and Sexual Activity  . Alcohol use: No  . Drug use: No  . Sexual activity: No  Other Topics Concern  . None  Social History Narrative  . None    Sleep: Good  Appetite:  Poor  Current Medications: Current Facility-Administered Medications  Medication Dose Route Frequency Provider Last Rate Last Dose  . acetaminophen (TYLENOL) tablet 650 mg  650 mg Oral Q4H PRN Phillips Grout, MD   650 mg at 09/14/17 2032  . amLODipine (NORVASC) tablet 10 mg  10 mg Oral Daily Patrecia Pour, MD   10 mg at 09/23/17 6948  . aspirin EC tablet 325 mg  325 mg Oral Daily Phillips Grout, MD   325 mg at 09/23/17 5462  . atorvastatin (LIPITOR) tablet 40 mg  40 mg Oral QHS Derrill Kay A, MD   40 mg at 09/22/17 2253  . carvedilol (COREG) tablet 12.5 mg  12.5 mg Oral BID WC Alphonzo Grieve, MD   12.5 mg at 09/23/17 0821  . cloNIDine (CATAPRES - Dosed in mg/24 hr) patch 0.1 mg  0.1 mg Transdermal Weekly Regalado, Cassie Freer, MD  0.1 mg at 09/17/17 1235  . divalproex (DEPAKOTE SPRINKLE) capsule 125 mg  125 mg Oral Q12H Patrecia Pour, MD   125 mg at 09/23/17 8315  . enoxaparin (LOVENOX) injection 40 mg  40 mg Subcutaneous Daily Theodis Blaze, MD   40 mg at 09/23/17 1761  . famotidine (PEPCID) tablet 20 mg  20 mg Oral QHS Arrien, Jimmy Picket, MD      . feeding supplement (GLUCERNA SHAKE) (GLUCERNA SHAKE) liquid 237 mL  237 mL Oral TID BM Mariel Aloe, MD   237 mL at 09/23/17 1245  . finasteride (PROSCAR) tablet 5 mg  5 mg Oral Daily Derrill Kay A, MD   5 mg at 09/23/17 6073  . fluconazole (DIFLUCAN) tablet 100 mg  100 mg Oral Daily Mariel Aloe, MD   100 mg at 09/23/17 0820  . gabapentin (NEURONTIN) capsule 100 mg   100 mg Oral QHS Patrecia Pour, MD   100 mg at 09/22/17 2253  . hydrALAZINE (APRESOLINE) injection 20 mg  20 mg Intravenous Q6H PRN Regalado, Belkys A, MD   20 mg at 09/22/17 1101  . hydrocortisone (ANUSOL-HC) 2.5 % rectal cream   Rectal PRN Patrecia Pour, MD      . insulin aspart (novoLOG) injection 0-9 Units  0-9 Units Subcutaneous TID WC Theodis Blaze, MD   5 Units at 09/23/17 1245  . insulin glargine (LANTUS) injection 15 Units  15 Units Subcutaneous QHS Mariel Aloe, MD   15 Units at 09/22/17 2252  . isosorbide mononitrate (IMDUR) 24 hr tablet 30 mg  30 mg Oral Daily Regalado, Belkys A, MD   30 mg at 09/23/17 0820  . lactulose (CHRONULAC) 10 GM/15ML solution 30 g  30 g Oral BID Regalado, Belkys A, MD   30 g at 09/23/17 0820  . Melatonin TABS 3 mg  3 mg Oral QHS Regalado, Belkys A, MD   3 mg at 09/22/17 2253  . mirtazapine (REMERON) tablet 7.5 mg  7.5 mg Oral QHS Patrecia Pour, MD   7.5 mg at 09/22/17 2254  . nitroGLYCERIN (NITROSTAT) SL tablet 0.4 mg  0.4 mg Sublingual PRN Phillips Grout, MD   0.4 mg at 09/11/17 0943  . nystatin (MYCOSTATIN) 100000 UNIT/ML suspension 500,000 Units  5 mL Oral QID Regalado, Belkys A, MD   500,000 Units at 09/23/17 1245  . ondansetron (ZOFRAN) tablet 4 mg  4 mg Oral Q8H PRN Patrecia Pour, MD      . polyethylene glycol (MIRALAX / GLYCOLAX) packet 17 g  17 g Oral Daily Janett Billow, RPH   17 g at 09/23/17 7106  . polyvinyl alcohol (LIQUIFILM TEARS) 1.4 % ophthalmic solution 1 drop  1 drop Both Eyes PRN Dana Allan I, MD   1 drop at 09/23/17 1320  . sodium chloride flush (NS) 0.9 % injection 3 mL  3 mL Intravenous Q12H Regalado, Belkys A, MD   3 mL at 09/23/17 0822  . sodium chloride flush (NS) 0.9 % injection 3 mL  3 mL Intravenous PRN Regalado, Belkys A, MD   3 mL at 09/12/17 1624  . tamsulosin (FLOMAX) capsule 0.4 mg  0.4 mg Oral Daily Derrill Kay A, MD   0.4 mg at 09/23/17 0819  . traMADol (ULTRAM) tablet 50 mg  50 mg Oral Q8H PRN Regalado,  Belkys A, MD   50 mg at 09/22/17 1053    Lab Results:  Results for orders placed or performed during the  hospital encounter of 09/05/17 (from the past 48 hour(s))  Glucose, capillary     Status: Abnormal   Collection Time: 09/21/17  4:55 PM  Result Value Ref Range   Glucose-Capillary 286 (H) 65 - 99 mg/dL  Glucose, capillary     Status: Abnormal   Collection Time: 09/21/17  9:17 PM  Result Value Ref Range   Glucose-Capillary 285 (H) 65 - 99 mg/dL  Glucose, capillary     Status: Abnormal   Collection Time: 09/22/17  7:53 AM  Result Value Ref Range   Glucose-Capillary 165 (H) 65 - 99 mg/dL  Glucose, capillary     Status: Abnormal   Collection Time: 09/22/17 12:26 PM  Result Value Ref Range   Glucose-Capillary 207 (H) 65 - 99 mg/dL  Glucose, capillary     Status: Abnormal   Collection Time: 09/22/17  5:02 PM  Result Value Ref Range   Glucose-Capillary 210 (H) 65 - 99 mg/dL  Glucose, capillary     Status: Abnormal   Collection Time: 09/22/17 10:34 PM  Result Value Ref Range   Glucose-Capillary 197 (H) 65 - 99 mg/dL  Basic metabolic panel     Status: Abnormal   Collection Time: 09/23/17  6:18 AM  Result Value Ref Range   Sodium 132 (L) 135 - 145 mmol/L   Potassium 4.3 3.5 - 5.1 mmol/L   Chloride 98 (L) 101 - 111 mmol/L   CO2 26 22 - 32 mmol/L   Glucose, Bld 205 (H) 65 - 99 mg/dL   BUN 41 (H) 6 - 20 mg/dL   Creatinine, Ser 2.03 (H) 0.61 - 1.24 mg/dL   Calcium 8.4 (L) 8.9 - 10.3 mg/dL   GFR calc non Af Amer 30 (L) >60 mL/min   GFR calc Af Amer 35 (L) >60 mL/min    Comment: (NOTE) The eGFR has been calculated using the CKD EPI equation. This calculation has not been validated in all clinical situations. eGFR's persistently <60 mL/min signify possible Chronic Kidney Disease.    Anion gap 8 5 - 15  Glucose, capillary     Status: Abnormal   Collection Time: 09/23/17  7:44 AM  Result Value Ref Range   Glucose-Capillary 162 (H) 65 - 99 mg/dL  Glucose, capillary     Status:  Abnormal   Collection Time: 09/23/17 12:06 PM  Result Value Ref Range   Glucose-Capillary 269 (H) 65 - 99 mg/dL    Musculoskeletal: Strength & Muscle Tone:Decreased secondary to physical deconditioning.  Gait & Station:He hasbilateral BKA. Patient leans:N/A   Psychiatric Specialty Exam: Physical Exam  Nursing note and vitals reviewed. Constitutional: He appears well-developed and well-nourished.  HENT:  Head: Normocephalic and atraumatic.  Neck: Normal range of motion.  Respiratory: Effort normal.  Musculoskeletal: Normal range of motion.  Neurological: He is alert.  Oriented to self.   Skin: No rash noted.  Psychiatric: His speech is normal. Cognition and memory are impaired. He expresses impulsivity.    ROS unable to assess since patient is uncooperative with interview.    Blood pressure (!) 138/45, pulse 64, temperature 98.2 F (36.8 C), temperature source Oral, resp. rate 19, height 6' 2" (1.88 m), weight 95.6 kg (210 lb 12.2 oz), SpO2 97 %.Body mass index is 27.06 kg/m.  General Appearance: Well Groomed, African American male with bilateral BKA who is lying in bed with a hospital gown. NAD.   Eye Contact:  Poor  Speech:  Normal Rate  Volume:  Normal  Mood:  Did not state  mood.  Affect:  Irritable  Thought Process: Superficially  Linear  Orientation:  Other:  Oriented to self.  Thought Content:  Logical  Suicidal Thoughts:  Unable to assess since patient is uncooperative with interview.  Homicidal Thoughts:  Unable to assess since patient is uncooperative with interview.  Memory:  Immediate;   Poor Recent;   Poor Remote;   Poor  Judgement:  Impaired  Insight:  Lacking  Psychomotor Activity:  Decreased  Concentration:  Concentration: Unable to assess since patient is uncooperative with interview. and Attention Span: Unable to assess since patient is uncooperative with interview.  Recall:  Poor  Fund of Knowledge:  Poor  Language:  Poor  Akathisia:  No   Handed:  Right  AIMS (if indicated):   N/A  Assets:  Housing  ADL's:  Impaired and requires assistance.   Cognition: Impaired secondary to dementia.   Sleep:   Okay    Assessment:  Shawn Pearson is a 76 y.o. male who was admitted withchest pain in the setting of uncontrolled blood pressure secondary to poor medication compliance. His behavior appears to have improved. He has been taking his medications consistently for the past few days. His appetite continues to remain poor and he is poorly engaged in interactions. He is tolerating Depakote and Remeron.   Treatment Plan Summary: -ContinueDepakote125 mg BIDfor agitation. Can titrate evening dose by 125 mg increments as clinically indicated for worsening agitation.  -Continue Remeron 7.5 mg qhs for irritability and to stimulate appetite.  -Avoid medications that can worsen confusion and agitation such as antihistamines, anticholingerics and benzodiazepines. Benzodiazpeines can also cause disinhibition. -Continue Melatonin 3 mg qhs to regulate sleep/wake cycle. Encourage patient to stay awake in the daytime so that he may sleep at night. -Antipsychotic use is not advised given prolonged QTc and risk for arrhthymias (QTc 559on 11/23, increased from 526 on 11/17). -Usesoft restraints as needed for imminent risk of harming self or others and if needed for emergent treatment.   -Recommend geropsychiatric hospitalization for further stabilization and treatment.  -Psychiatry will follow patient as clinically needed.    Faythe Dingwall, DO 09/23/2017, 4:16 PM

## 2017-09-23 NOTE — Progress Notes (Signed)
PROGRESS NOTE    Shawn Pearson  ZOX:[id number] DOB: Dec 02, 1940 DOA: 09/05/2017 PCP: Mabeline Caras, NP    Brief Narrative:  76 year old male, nursing home resident, who presented with chest pain and uncontrolled hypertension. Patient is known to have dementia, chronic kidney disease and hypertension. Apparently patient had been refusing his medications at the skilled nursing facility. On his initial physical examination blood pressure 170/68, heart rate 83, respiratory 21, oxygen saturation was 99%, temperature 98. His lungs were clear to auscultation bilaterally, heart sounds present and  rhythmic, abdomen was soft nontender, no lower extremity edema.   Patient was admitted to the hospital working diagnosis uncontrolled hypertension.   While in hospital reported to be suicidal and homicidal. He was seen by psychiatry, recommend IV Depakote, and inpatient geropsychiatric facility.    Assessment & Plan:   Principal Problem:   Dementia Active Problems:   Hypertensive urgency   CKD stage 3 secondary to diabetes (HCC)   Chest pain   Noncompliance with medication regimen   Agitation   Elevated troponin   1. Dementia, with suicidal/sagittal ideation. Patient with sitter one to one, continue neuro checks per unit protocol, no agitation. Will continue divaloprex, mirtazapine, melatonin.   2. Hypertension. Will continue blood pressure control with amlodipine, carvedilol, clonidine, isosorbide and as needed hydralazine, blood pressure systolic 811 to 914. Continue asa and atorvastatin.   3. Chronic diastolic heart failure. Stable with no signs of acute exacerbation, will continue blood pressure control.   4. Esophagitis. Continue exchange pantoprazole to ranitidine for antiacid therapy, patient tolerating po well.   5. Type 2 diabetes mellitus. Will continue insulin sliding scale for glucose cover and monitoring. Long acting insulin with 15 units lantus.   6. Chronic kidney disease  stage III. Renal function stable, continue to follow renal function as needed. Serum cr stable at 2,0, will follow in 48 hours, hold on pantoprazole.   7. Stage III rectal adenocarcinoma. Plan to follow up as outpatient with radiation oncology. No sings of significant rectal bleeding. On hydrocortisone cream.    DVT prophylaxis:enoxaparion  Code Status:  full Family Communication:  No family at the bedside Disposition Plan: psych unit   Consultants:   psych  Procedures:     Antimicrobials:       Subjective: Patient complains of chest pain and abdominal pain, that improves with meals, no nausea or vomiting, no agitation or confusion.   Objective: Vitals:   09/22/17 1055 09/22/17 1153 09/22/17 1506 09/22/17 2140  BP: (!) 161/58 (!) 143/59 (!) 131/48 (!) 139/51  Pulse: 64 63 68 63  Resp: 18 17 20 18   Temp:    97.8 F (36.6 C)  TempSrc:    Oral  SpO2: 97% 99% 99% 100%  Weight:      Height:        Intake/Output Summary (Last 24 hours) at 09/23/2017 1010 Last data filed at 09/23/2017 0945 Gross per 24 hour  Intake 473 ml  Output 925 ml  Net -452 ml   Filed Weights   09/20/17 1056 09/21/17 0700 09/22/17 0445  Weight: 94.8 kg (208 lb 14.4 oz) 96 kg (211 lb 10.3 oz) 95.6 kg (210 lb 12.2 oz)    Examination:   General: Not in pain or dyspnea, deconditioned Neurology: Awake and alert, non focal  E ENT: no pallor, no icterus, oral mucosa moist Cardiovascular: No JVD. S1-S2 present, rhythmic, no gallops, rubs, or murmurs. No lower extremity edema. Pulmonary: vesicular breath sounds bilaterally, adequate air movement, no wheezing, rhonchi  or rales. Gastrointestinal. Abdomen distended no organomegaly, non tender, no rebound or guarding Skin. No rashes Musculoskeletal: no joint deformities     Data Reviewed: I have personally reviewed following labs and imaging studies  CBC: No results for input(s): WBC, NEUTROABS, HGB, HCT, MCV, PLT in the last 168 hours. Basic  Metabolic Panel: Recent Labs  Lab 09/19/17 0427 09/23/17 0618  NA 133* 132*  K 4.6 4.3  CL 99* 98*  CO2 25 26  GLUCOSE 280* 205*  BUN 42* 41*  CREATININE 1.81* 2.03*  CALCIUM 8.5* 8.4*   GFR: Estimated Creatinine Clearance: 35.4 mL/min (A) (by C-G formula based on SCr of 2.03 mg/dL (H)). Liver Function Tests: No results for input(s): AST, ALT, ALKPHOS, BILITOT, PROT, ALBUMIN in the last 168 hours. No results for input(s): LIPASE, AMYLASE in the last 168 hours. No results for input(s): AMMONIA in the last 168 hours. Coagulation Profile: No results for input(s): INR, PROTIME in the last 168 hours. Cardiac Enzymes: No results for input(s): CKTOTAL, CKMB, CKMBINDEX, TROPONINI in the last 168 hours. BNP (last 3 results) No results for input(s): PROBNP in the last 8760 hours. HbA1C: No results for input(s): HGBA1C in the last 72 hours. CBG: Recent Labs  Lab 09/22/17 0753 09/22/17 1226 09/22/17 1702 09/22/17 2234 09/23/17 0744  GLUCAP 165* 207* 210* 197* 162*   Lipid Profile: No results for input(s): CHOL, HDL, LDLCALC, TRIG, CHOLHDL, LDLDIRECT in the last 72 hours. Thyroid Function Tests: No results for input(s): TSH, T4TOTAL, FREET4, T3FREE, THYROIDAB in the last 72 hours. Anemia Panel: No results for input(s): VITAMINB12, FOLATE, FERRITIN, TIBC, IRON, RETICCTPCT in the last 72 hours.    Radiology Studies: I have reviewed all of the imaging during this hospital visit personally     Scheduled Meds: . amLODipine  10 mg Oral Daily  . aspirin EC  325 mg Oral Daily  . atorvastatin  40 mg Oral QHS  . carvedilol  12.5 mg Oral BID WC  . cloNIDine  0.1 mg Transdermal Weekly  . divalproex  125 mg Oral Q12H  . enoxaparin (LOVENOX) injection  40 mg Subcutaneous Daily  . feeding supplement (GLUCERNA SHAKE)  237 mL Oral TID BM  . finasteride  5 mg Oral Daily  . fluconazole  100 mg Oral Daily  . gabapentin  100 mg Oral QHS  . insulin aspart  0-9 Units Subcutaneous TID WC    . insulin glargine  15 Units Subcutaneous QHS  . isosorbide mononitrate  30 mg Oral Daily  . lactulose  30 g Oral BID  . Melatonin  3 mg Oral QHS  . mirtazapine  7.5 mg Oral QHS  . nystatin  5 mL Oral QID  . pantoprazole  40 mg Oral BID  . polyethylene glycol  17 g Oral Daily  . sodium chloride flush  3 mL Intravenous Q12H  . tamsulosin  0.4 mg Oral Daily  . traMADol  50 mg Oral Once   Continuous Infusions:   LOS: 17 days        Shawn Sarnowski [name], MD [facility] Pager [phone]

## 2017-09-23 NOTE — Care Management Important Message (Signed)
Important Message  Patient Details  Name: Shawn Pearson MRN: [id number] Date of Birth: 1941/03/19   Medicare Important Message Given:  Yes    Shamir Sedlar Abena 09/23/2017, 10:26 AM

## 2017-09-24 DIAGNOSIS — K209 Esophagitis, unspecified: Secondary | ICD-10-CM

## 2017-09-24 LAB — BASIC METABOLIC PANEL
Anion gap: 10 (ref 5–15)
BUN: 41 mg/dL — AB (ref 6–20)
CALCIUM: 8.4 mg/dL — AB (ref 8.9–10.3)
CHLORIDE: 100 mmol/L — AB (ref 101–111)
CO2: 22 mmol/L (ref 22–32)
CREATININE: 1.88 mg/dL — AB (ref 0.61–1.24)
GFR calc Af Amer: 38 mL/min — ABNORMAL LOW (ref >60)
GFR calc non Af Amer: 33 mL/min — ABNORMAL LOW (ref >60)
Glucose, Bld: 176 mg/dL — ABNORMAL HIGH (ref 65–99)
Potassium: 4.5 mmol/L (ref 3.5–5.1)
SODIUM: 132 mmol/L — AB (ref 135–145)

## 2017-09-24 LAB — GLUCOSE, CAPILLARY
GLUCOSE-CAPILLARY: 161 mg/dL — AB (ref 65–99)
Glucose-Capillary: 219 mg/dL — ABNORMAL HIGH (ref 65–99)
Glucose-Capillary: 236 mg/dL — ABNORMAL HIGH (ref 65–99)
Glucose-Capillary: 244 mg/dL — ABNORMAL HIGH (ref 65–99)

## 2017-09-24 NOTE — Progress Notes (Signed)
CSW confirmed that patient is still on [facility] waitlist.   [name] [facility] [phone]

## 2017-09-24 NOTE — Progress Notes (Signed)
PROGRESS NOTE    Shawn Pearson  IWL:[id number] DOB: 1941-08-12 DOA: 09/05/2017 PCP: Mabeline Caras, NP    Brief Narrative:  76 year old male, nursing home resident, who presented with chest pain and uncontrolled hypertension. Patient is known to have dementia, chronic kidney disease and hypertension. Apparently patient had been refusing his medications at the skilled nursing facility. On his initial physical examination blood pressure 170/68, heart rate 83, respiratory 21, oxygen saturation was 99%, temperature 98. His lungs were clear to auscultation bilaterally, heart sounds present and  rhythmic, abdomen was soft nontender, no lower extremity edema.   Patient was admitted to the hospital working diagnosis uncontrolled hypertension.   While in hospital reported to be suicidal and homicidal. He was seen by psychiatry, recommend IV Depakote, and inpatient geropsychiatric facility.    Assessment & Plan:   Principal Problem:   Dementia Active Problems:   Hypertensive urgency   CKD stage 3 secondary to diabetes (HCC)   Chest pain   Noncompliance with medication regimen   Agitation   Elevated troponin   1. Dementia, with suicidal/sagittal ideation. On one to one suicidal precautions. On low dose divaloprex, plus mirtazapine, melatonin. Holding antipsychotics due to prolonged QT.   2. Hypertension. On amlodipine, carvedilol, clonidine, isosorbide and as needed hydralazine. Systolic blood pressure 174'Y.   3. Chronic diastolic heart failure. Clinically stable, will continue blood pressure monitoring.  4. Esophagitis. Tolerating well ranitidine. Occasional chest and abdominal pain. Will complete 14 days of fluconazole.   5. Type 2 diabetes mellitus. On insulin sliding scale for glucose cover and monitoring plus insulin lanuts15 units, capillary glucose 255, 273, 161, 219.   6. Chronic kidney disease stage III. Serum creatinine at 1,88 with K at 4,5 and serum bicarbonate at  22, avoid nephrotoxic medications, continue blood pressure control.  7. Stage III rectal adenocarcinoma. Currently asymptomatic, continue topical steroid.   DVT prophylaxis:enoxaparion  Code Status:  full Family Communication:  No family at the bedside Disposition Plan: psych unit   Consultants:   psych  Procedures:     Antimicrobials:       Subjective: Patient complained of constipation, no nausea or vomiting, occasional abdominal and chest pain.   Objective: Vitals:   09/22/17 2140 09/23/17 1351 09/23/17 2124 09/24/17 0527  BP: (!) 139/51 (!) 138/45 (!) 152/49 (!) 153/64  Pulse: 63 64 74 69  Resp: 18 19 12 20   Temp: 97.8 F (36.6 C) 98.2 F (36.8 C) 99.2 F (37.3 C) 98.3 F (36.8 C)  TempSrc: Oral Oral Oral Oral  SpO2: 100% 97% 100% 98%  Weight:      Height:        Intake/Output Summary (Last 24 hours) at 09/24/2017 1158 Last data filed at 09/24/2017 1113 Gross per 24 hour  Intake 1040 ml  Output 1608 ml  Net -568 ml   Filed Weights   09/20/17 1056 09/21/17 0700 09/22/17 0445  Weight: 94.8 kg (208 lb 14.4 oz) 96 kg (211 lb 10.3 oz) 95.6 kg (210 lb 12.2 oz)    Examination:   General: Not in pain or dyspnea, deconditioned Neurology: Awake and alert, non focal  E ENT: mild pallor, no icterus, oral mucosa moist Cardiovascular: No JVD. S1-S2 present, rhythmic, no gallops, rubs, or murmurs. No lower extremity edema. Pulmonary: vesicular breath sounds bilaterally, adequate air movement, no wheezing, rhonchi or rales. Gastrointestinal. Abdomen flat, no organomegaly, non tender, no rebound or guarding Skin. No rashes Musculoskeletal: no joint deformities     Data Reviewed: I have personally  reviewed following labs and imaging studies  CBC: No results for input(s): WBC, NEUTROABS, HGB, HCT, MCV, PLT in the last 168 hours. Basic Metabolic Panel: Recent Labs  Lab 09/19/17 0427 09/23/17 0618 09/24/17 0732  NA 133* 132* 132*  K 4.6 4.3 4.5    CL 99* 98* 100*  CO2 25 26 22   GLUCOSE 280* 205* 176*  BUN 42* 41* 41*  CREATININE 1.81* 2.03* 1.88*  CALCIUM 8.5* 8.4* 8.4*   GFR: Estimated Creatinine Clearance: 38.3 mL/min (A) (by C-G formula based on SCr of 1.88 mg/dL (H)). Liver Function Tests: No results for input(s): AST, ALT, ALKPHOS, BILITOT, PROT, ALBUMIN in the last 168 hours. No results for input(s): LIPASE, AMYLASE in the last 168 hours. No results for input(s): AMMONIA in the last 168 hours. Coagulation Profile: No results for input(s): INR, PROTIME in the last 168 hours. Cardiac Enzymes: No results for input(s): CKTOTAL, CKMB, CKMBINDEX, TROPONINI in the last 168 hours. BNP (last 3 results) No results for input(s): PROBNP in the last 8760 hours. HbA1C: No results for input(s): HGBA1C in the last 72 hours. CBG: Recent Labs  Lab 09/23/17 0744 09/23/17 1206 09/23/17 1653 09/23/17 2100 09/24/17 0751  GLUCAP 162* 269* 255* 272* 161*   Lipid Profile: No results for input(s): CHOL, HDL, LDLCALC, TRIG, CHOLHDL, LDLDIRECT in the last 72 hours. Thyroid Function Tests: No results for input(s): TSH, T4TOTAL, FREET4, T3FREE, THYROIDAB in the last 72 hours. Anemia Panel: No results for input(s): VITAMINB12, FOLATE, FERRITIN, TIBC, IRON, RETICCTPCT in the last 72 hours.    Radiology Studies: I have reviewed all of the imaging during this hospital visit personally     Scheduled Meds: . amLODipine  10 mg Oral Daily  . aspirin EC  325 mg Oral Daily  . atorvastatin  40 mg Oral QHS  . carvedilol  12.5 mg Oral BID WC  . cloNIDine  0.1 mg Transdermal Weekly  . divalproex  125 mg Oral Q12H  . enoxaparin (LOVENOX) injection  40 mg Subcutaneous Daily  . famotidine  20 mg Oral QHS  . feeding supplement (GLUCERNA SHAKE)  237 mL Oral TID BM  . finasteride  5 mg Oral Daily  . fluconazole  100 mg Oral Daily  . gabapentin  100 mg Oral QHS  . insulin aspart  0-9 Units Subcutaneous TID WC  . insulin glargine  15 Units  Subcutaneous QHS  . isosorbide mononitrate  30 mg Oral Daily  . lactulose  30 g Oral BID  . Melatonin  3 mg Oral QHS  . mirtazapine  7.5 mg Oral QHS  . nystatin  5 mL Oral QID  . polyethylene glycol  17 g Oral Daily  . sodium chloride flush  3 mL Intravenous Q12H  . tamsulosin  0.4 mg Oral Daily   Continuous Infusions:   LOS: 18 days        Mauricio [name], MD [facility] Pager [phone]

## 2017-09-25 LAB — GLUCOSE, CAPILLARY
GLUCOSE-CAPILLARY: 310 mg/dL — AB (ref 65–99)
GLUCOSE-CAPILLARY: 236 mg/dL — AB (ref 65–99)
Glucose-Capillary: 196 mg/dL — ABNORMAL HIGH (ref 65–99)
Glucose-Capillary: 361 mg/dL — ABNORMAL HIGH (ref 65–99)

## 2017-09-25 MED ORDER — SIMETHICONE 80 MG PO CHEW
80.0000 mg | CHEWABLE_TABLET | Freq: Four times a day (QID) | ORAL | Status: DC | PRN
  Administered 2017-09-27 – 2017-11-05 (×5): 80 mg via ORAL
  Filled 2017-09-25 (×5): qty 1

## 2017-09-25 NOTE — Progress Notes (Signed)
PROGRESS NOTE    Shawn Pearson  [id number]:[id number] DOB: 01/14/1941 DOA: 09/05/2017 PCP: Mabeline Caras, NP    Brief Narrative:  76 year old male,nursing home resident,who presented with chest pain and uncontrolled hypertension. Patient is known to have dementia, chronic kidney disease andhypertension.Apparently patient hadbeen refusing his medications at the skilled nursing facility. On his initial physical examination blood pressure 170/68, heart rate 83, respiratory 21, oxygen saturation was 99%, temperature 98.His lungs were clear to auscultation bilaterally, heart sounds presentandrhythmic, abdomen was soft nontender, no lower extremity edema.  Patient was admitted to the hospital working diagnosis uncontrolled hypertension.  While in hospital reported to be suicidal and homicidal.He was seen by psychiatry, recommend IV Depakote,and inpatient geropsychiatric facility.   Assessment & Plan:   Principal Problem:   Dementia Active Problems:   Hypertensive urgency   CKD stage 3 secondary to diabetes (HCC)   Chest pain   Noncompliance with medication regimen   Agitation   Elevated troponin   1.Dementia,with suicidal/sagittal ideation. Continue with one to one sitter and suicidal precautions. Medications: divaloprex and mirtazapine, melatonin.  2.Hypertension. Continue with amlodipine, carvedilol, clonidine, isosorbide. Blood pressure continue well controlled. Continue aspirin and statin.   3.Chronic diastolic heart failure. No signs of exacerbation.   4.Esophagitis. Continue ranitidine. Fluconazole for total of 14 days.  5.Type 2 diabetes mellitus. Continue with insulin sliding scale for glucose cover and monitoring. Basal insulin with lanuts15 units, capillary glucose 219, 236, 244, 196, 361.   6.Chronic kidney disease stage III. Will continue to avoid nephrotoxic medications.   7.Stage III rectal adenocarcinoma. With topical steroid. No pain or  bleeding.   DVT prophylaxis:enoxaparion Code Status:full Family Communication:No family at the bedside Disposition Plan:psych unit   Consultants:  psych  Procedures:    Antimicrobials:     Subjective: Patient with no new complains, urine was noted to be dark for the last 24 hours, continue to have sitter at bedside.    Objective: Vitals:   09/24/17 0527 09/24/17 1731 09/24/17 2112 09/25/17 0455  BP: (!) 153/64 (!) 139/50 (!) 145/54 (!) 137/53  Pulse: 69 75 66 61  Resp: 20  20 20   Temp: 98.3 F (36.8 C)  98.3 F (36.8 C)   TempSrc: Oral  Oral   SpO2: 98%  98% 98%  Weight:      Height:        Intake/Output Summary (Last 24 hours) at 09/25/2017 1030 Last data filed at 09/25/2017 0754 Gross per 24 hour  Intake 1654 ml  Output 836 ml  Net 818 ml   Filed Weights   09/20/17 1056 09/21/17 0700 09/22/17 0445  Weight: 94.8 kg (208 lb 14.4 oz) 96 kg (211 lb 10.3 oz) 95.6 kg (210 lb 12.2 oz)    Examination:   General: Not in pain or dyspnea, deconditioned Neurology: Awake and alert, non focal  E ENT: no pallor, no icterus, oral mucosa moist Cardiovascular: No JVD. S1-S2 present, rhythmic, no gallops, rubs, or murmurs. No lower extremity edema. Pulmonary: vesicular breath sounds bilaterally, adequate air movement, no wheezing, rhonchi or rales. Gastrointestinal. Abdomen flat, no organomegaly, non tender, no rebound or guarding Skin. 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      Data Reviewed: I have personally reviewed following labs and imaging studies  CBC: No results for input(s): WBC, NEUTROABS, HGB, HCT, MCV, PLT in the last 168 hours. Basic Metabolic Panel: Recent Labs  Lab 09/19/17 0427 09/23/17 0618 09/24/17 0732  NA 133* 132* 132*  K 4.6 4.3 4.5  CL 99* 98* 100*  CO2 25 26 22   GLUCOSE 280* 205* 176*  BUN 42* 41* 41*  CREATININE 1.81* 2.03* 1.88*  CALCIUM 8.5* 8.4* 8.4*   GFR: Estimated Creatinine  Clearance: 38.3 mL/min (A) (by C-G formula based on SCr of 1.88 mg/dL (H)). Liver Function Tests: No results for input(s): AST, ALT, ALKPHOS, BILITOT, PROT, ALBUMIN in the last 168 hours. No results for input(s): LIPASE, AMYLASE in the last 168 hours. No results for input(s): AMMONIA in the last 168 hours. Coagulation Profile: No results for input(s): INR, PROTIME in the last 168 hours. Cardiac Enzymes: No results for input(s): CKTOTAL, CKMB, CKMBINDEX, TROPONINI in the last 168 hours. BNP (last 3 results) No results for input(s): PROBNP in the last 8760 hours. HbA1C: No results for input(s): HGBA1C in the last 72 hours. CBG: Recent Labs  Lab 09/24/17 0751 09/24/17 1241 09/24/17 1655 09/24/17 2109 09/25/17 0746  GLUCAP 161* 219* 236* 244* 196*   Lipid Profile: No results for input(s): CHOL, HDL, LDLCALC, TRIG, CHOLHDL, LDLDIRECT in the last 72 hours. Thyroid Function Tests: No results for input(s): TSH, T4TOTAL, FREET4, T3FREE, THYROIDAB in the last 72 hours. Anemia Panel: No results for input(s): VITAMINB12, FOLATE, FERRITIN, TIBC, IRON, RETICCTPCT in the last 72 hours.    Radiology Studies: I have reviewed all of the imaging during this hospital visit personally     Scheduled Meds: . amLODipine  10 mg Oral Daily  . aspirin EC  325 mg Oral Daily  . atorvastatin  40 mg Oral QHS  . carvedilol  12.5 mg Oral BID WC  . cloNIDine  0.1 mg Transdermal Weekly  . divalproex  125 mg Oral Q12H  . enoxaparin (LOVENOX) injection  40 mg Subcutaneous Daily  . famotidine  20 mg Oral QHS  . feeding supplement (GLUCERNA SHAKE)  237 mL Oral TID BM  . finasteride  5 mg Oral Daily  . fluconazole  100 mg Oral Daily  . gabapentin  100 mg Oral QHS  . insulin aspart  0-9 Units Subcutaneous TID WC  . insulin glargine  15 Units Subcutaneous QHS  . isosorbide mononitrate  30 mg Oral Daily  . lactulose  30 g Oral BID  . Melatonin  3 mg Oral QHS  . mirtazapine  7.5 mg Oral QHS  . nystatin   5 mL Oral QID  . polyethylene glycol  17 g Oral Daily  . sodium chloride flush  3 mL Intravenous Q12H  . tamsulosin  0.4 mg Oral Daily   Continuous Infusions:   LOS: 19 days        Minahil Quinlivan [name], MD [facility] Pager [phone]

## 2017-09-25 NOTE — Progress Notes (Signed)
Nutrition Follow-up  DOCUMENTATION CODES:   Obesity unspecified  INTERVENTION:   -Continue Glucerna Shake po TID, each supplement provides 220 kcal and 10 grams of protein  NUTRITION DIAGNOSIS:   Inadequate oral intake related to lethargy/confusion as evidenced by meal completion < 25%.  Progressing  GOAL:   Patient will meet greater than or equal to 90% of their needs  Progressing  MONITOR:   PO intake, Supplement acceptance, Labs, Weight trends, Skin, I & O's  REASON FOR ASSESSMENT:   Malnutrition Screening Tool    ASSESSMENT:   Patient is a 76 year old male with past medical history significant for dementia, CKD, HTN , medical noncompliance wth meds, neg stress test in 2/18. Patient was admitted from SNF with chest pain, uncontrolled blood pressure due to non compliance with medication and headache.  He says he does not like the nursing home he is at and that is why he refuses to take his meds. The patient has been non co operative with the Nursing staff, and still refusing his medication. Psych has been consulted as patient likely has dementia with behavioral problems.   12/1- remeron initiated by psychiatry in attempt to stimulate appetite and decrease agitation 12/3- Ensure supplement changed to Glucerna by nursing staff due to increased [place] with safety sitter; she reports pt has been consuming most meals and taking most medications over the past two days (pt does need a lot of encouragement to take medications). Meal completion 25-75%; pt consumed all of his breakfast other than his oatmeal. He is taking liquids and supplements well. Noted that pt is now on Glucerna; agree with this change due to improved oral intake.   Case discussed with CSW, who confirmed pt remains on waiting list at [facility].   Labs reviewed: CBGS: [id number] (inpatient orders for glycemic control are 0-9 units insulin aspart TID with meals and 15 units insulin glargine q HS).   Diet Order:   Diet Heart Room service appropriate? Yes; Fluid consistency: Thin  EDUCATION NEEDS:   Not appropriate for education at this time  Skin:  Skin Assessment: Reviewed RN Assessment  Last BM:  09/24/17  Height:   Ht Readings from Last 1 Encounters:  09/06/17 6\' 2"  (1.88 m)    Weight:   Wt Readings from Last 1 Encounters:  09/22/17 210 lb 12.2 oz (95.6 kg)    Ideal Body Weight:  75.1 kg  BMI:  Body mass index is 27.06 kg/m.  Estimated Nutritional Needs:   Kcal:  1650-1850  Protein:  80-95 grams  Fluid:  1.6-1.8 L    Staton Markey A. Jimmye Norman, RD, LDN, CDE Pager: [phone] After hours Pager: [phone]

## 2017-09-26 LAB — GLUCOSE, CAPILLARY
GLUCOSE-CAPILLARY: 163 mg/dL — AB (ref 65–99)
GLUCOSE-CAPILLARY: 156 mg/dL — AB (ref 65–99)
GLUCOSE-CAPILLARY: 161 mg/dL — AB (ref 65–99)
GLUCOSE-CAPILLARY: 186 mg/dL — AB (ref 65–99)

## 2017-09-26 NOTE — Progress Notes (Signed)
PROGRESS NOTE    Shawn Pearson  [id number]:[id number] DOB: 11-Nov-1940 DOA: 09/05/2017 PCP: Mabeline Caras, NP    Brief Narrative:  76 year old male,nursing home resident,who presented with chest pain and uncontrolled hypertension. Patient is known to have dementia, chronic kidney disease andhypertension.Apparently patient hadbeen refusing his medications at the skilled nursing facility. On his initial physical examination blood pressure 170/68, heart rate 83, respiratory 21, oxygen saturation was 99%, temperature 98.His lungs were clear to auscultation bilaterally, heart sounds presentandrhythmic, abdomen was soft nontender, no lower extremity edema.  Patient was admitted to the hospital working diagnosis uncontrolled hypertension.  While in hospital reported to be suicidal and homicidal.He was seen by psychiatry, recommend IV Depakote,and inpatient geropsychiatric facility.   Assessment & Plan:   Principal Problem:   Dementia Active Problems:   Hypertensive urgency   CKD stage 3 secondary to diabetes (HCC)   Chest pain   Noncompliance with medication regimen   Agitation   Elevated troponin  1.Dementia,with suicidal/sagittal ideation.Tolerating welldivaloprex andmirtazapine, melatonin.Pending transfer to psych facility, continue one to one sitter and suicidal precautions.   2.Hypertension.On amlodipine, carvedilol, clonidine, isosorbide. Continue aspirin and statin. Systolic blood pressure 712 to 153.   3.Chronic diastolic heart failure.  Stable with no signs of exacerbation.   4.Esophagitis.On ranitidine. Fluconazole to complete Dec 13.   5.Type 2 diabetes mellitus.On insulin sliding scale for glucose cover and monitoring. Continue basalinsulin with lanuts15 units, capillary glucose 361, 310, 236, 163, 156.    6.Chronic kidney disease stage III.Avoid nephrotoxic medications.   7.Stage III rectal adenocarcinoma.On topical steroid.  DVT  prophylaxis:enoxaparion Code Status:full Family Communication:No family at the bedside Disposition Plan:psych unit   Consultants:  psych  Procedures:    Antimicrobials:    Subjective: Patient feeling well this am, no nausea or vomiting, no chest pain or dyspnea. Asking for ensure.   Objective: Vitals:   09/25/17 1048 09/25/17 1731 09/25/17 2037 09/26/17 0857  BP: (!) 153/67 (!) 158/48 (!) 149/57 (!) 153/67  Pulse: 60 70 65 63  Resp: 16  19   Temp: 97.8 F (36.6 C)  (!) 97.3 F (36.3 C)   TempSrc: Oral  Oral   SpO2: 100% 95% 98%   Weight:      Height:        Intake/Output Summary (Last 24 hours) at 09/26/2017 1009 Last data filed at 09/26/2017 0437 Gross per 24 hour  Intake 1784 ml  Output 675 ml  Net 1109 ml   Filed Weights   09/20/17 1056 09/21/17 0700 09/22/17 0445  Weight: 94.8 kg (208 lb 14.4 oz) 96 kg (211 lb 10.3 oz) 95.6 kg (210 lb 12.2 oz)    Examination:   General: Not in pain or dyspnea Neurology: Awake and alert, non focal  E ENT: no pallor, no icterus, oral mucosa moist Cardiovascular: No JVD. S1-S2 present, rhythmic, no gallops, rubs, or murmurs. No lower extremity edema. Pulmonary: vesicular breath sounds bilaterally, adequate air movement, no wheezing, rhonchi or rales. Gastrointestinal. Abdomen protuberant, no organomegaly, non tender, no rebound or guarding Skin. No rashes Musculoskeletal: no joint deformities/ bilateral BKA.      Data Reviewed: I have personally reviewed following labs and imaging studies  CBC: No results for input(s): WBC, NEUTROABS, HGB, HCT, MCV, PLT in the last 168 hours. Basic Metabolic Panel: Recent Labs  Lab 09/23/17 0618 09/24/17 0732  NA 132* 132*  K 4.3 4.5  CL 98* 100*  CO2 26 22  GLUCOSE 205* 176*  BUN 41* 41*  CREATININE 2.03* 1.88*  CALCIUM 8.4* 8.4*   GFR: Estimated Creatinine Clearance: 38.3 mL/min (A) (by C-G formula based on SCr of 1.88 mg/dL (H)). Liver Function  Tests: No results for input(s): AST, ALT, ALKPHOS, BILITOT, PROT, ALBUMIN in the last 168 hours. No results for input(s): LIPASE, AMYLASE in the last 168 hours. No results for input(s): AMMONIA in the last 168 hours. Coagulation Profile: No results for input(s): INR, PROTIME in the last 168 hours. Cardiac Enzymes: No results for input(s): CKTOTAL, CKMB, CKMBINDEX, TROPONINI in the last 168 hours. BNP (last 3 results) No results for input(s): PROBNP in the last 8760 hours. HbA1C: No results for input(s): HGBA1C in the last 72 hours. CBG: Recent Labs  Lab 09/25/17 0746 09/25/17 1206 09/25/17 1642 09/25/17 2047 09/26/17 0834  GLUCAP 196* 361* 310* 236* 163*   Lipid Profile: No results for input(s): CHOL, HDL, LDLCALC, TRIG, CHOLHDL, LDLDIRECT in the last 72 hours. Thyroid Function Tests: No results for input(s): TSH, T4TOTAL, FREET4, T3FREE, THYROIDAB in the last 72 hours. Anemia Panel: No results for input(s): VITAMINB12, FOLATE, FERRITIN, TIBC, IRON, RETICCTPCT in the last 72 hours.    Radiology Studies: I have reviewed all of the imaging during this hospital visit personally     Scheduled Meds: . amLODipine  10 mg Oral Daily  . aspirin EC  325 mg Oral Daily  . atorvastatin  40 mg Oral QHS  . carvedilol  12.5 mg Oral BID WC  . cloNIDine  0.1 mg Transdermal Weekly  . divalproex  125 mg Oral Q12H  . enoxaparin (LOVENOX) injection  40 mg Subcutaneous Daily  . famotidine  20 mg Oral QHS  . feeding supplement (GLUCERNA SHAKE)  237 mL Oral TID BM  . finasteride  5 mg Oral Daily  . fluconazole  100 mg Oral Daily  . gabapentin  100 mg Oral QHS  . insulin aspart  0-9 Units Subcutaneous TID WC  . insulin glargine  15 Units Subcutaneous QHS  . isosorbide mononitrate  30 mg Oral Daily  . lactulose  30 g Oral BID  . Melatonin  3 mg Oral QHS  . mirtazapine  7.5 mg Oral QHS  . nystatin  5 mL Oral QID  . polyethylene glycol  17 g Oral Daily  . sodium chloride flush  3 mL  Intravenous Q12H  . tamsulosin  0.4 mg Oral Daily   Continuous Infusions:   LOS: 20 days        Shawn Pearson [name], MD [facility] Pager [phone]

## 2017-09-27 LAB — GLUCOSE, CAPILLARY
GLUCOSE-CAPILLARY: 148 mg/dL — AB (ref 65–99)
GLUCOSE-CAPILLARY: 194 mg/dL — AB (ref 65–99)
Glucose-Capillary: 224 mg/dL — ABNORMAL HIGH (ref 65–99)
Glucose-Capillary: 201 mg/dL — ABNORMAL HIGH (ref 65–99)
Glucose-Capillary: 244 mg/dL — ABNORMAL HIGH (ref 65–99)

## 2017-09-27 NOTE — Progress Notes (Signed)
[facility] PROGRESS NOTE  Patient: Shawn Pearson [id number]   PCP: Mabeline Caras, NP DOB: June 08, 1941   DOA: 09/05/2017   DOS: 09/27/2017    Subjective: Pt denies any fever, chills, headache,  cough, chest pain, palpitation, shortness of breath, pedal edema, nausea, vomiting, diarrhea, constipation, abdominal pain.  Objective:  Vitals:   09/26/17 1755 09/26/17 2105  BP:  (!) 147/65  Pulse: 64 62  Resp:  18  Temp:  97.7 F (36.5 C)  SpO2:  98%    General: Appear in no distress, no Rash; Oral Mucosa moist. no Abnormal Mass Or lumps Cardiovascular: S1 and S2 Present, no Murmur, Respiratory: normal respiratory effort, Bilateral Air entry present and Clear to Auscultation, no Crackles, no wheezes Abdomen: Bowel Sound present, Soft and no tenderness, no hernia Extremities: no Pedal edema, on calf tenderness Neurology: Alert, Awake and Oriented to Time, Place and Person. affect appropriate. Grossly no focal neuro deficit.   Assessment and plan: 1.Dementia,with suicidal/sagittal ideation.Tolerating welldivaloprexandmirtazapine, melatonin.Pending transfer to psych facility, continue one to one sitter and suicidal precautions.   2.Type 2 diabetes mellitus.On insulin sliding scale for glucose cover and monitoring. Increased lantus to 18.   Author: Berle Mull, MD [facility] Pager: [phone] 09/27/2017 12:50 PM   If 7PM-7AM, please contact night-coverage at [email or web address], password [facility]

## 2017-09-28 LAB — GLUCOSE, CAPILLARY
Glucose-Capillary: 260 mg/dL — ABNORMAL HIGH (ref 65–99)
Glucose-Capillary: 237 mg/dL — ABNORMAL HIGH (ref 65–99)

## 2017-09-28 MED ORDER — INSULIN GLARGINE 100 UNIT/ML ~~LOC~~ SOLN
5.0000 [IU] | Freq: Every day | SUBCUTANEOUS | Status: DC
  Administered 2017-09-28 – 2017-09-30 (×3): 5 [IU] via SUBCUTANEOUS
  Filled 2017-09-28 (×3): qty 0.05

## 2017-09-28 NOTE — Plan of Care (Signed)
Patient able to verbalize health care needs, patients PO intake remains adequate and is being supplemented with nutritional shake

## 2017-09-28 NOTE — Progress Notes (Signed)
PROGRESS NOTE    Shawn Pearson  OAC:[id number] DOB: [birth year] DOA: 09/05/2017 PCP: Mabeline Caras, NP    Brief Narrative:  76 year old male,nursing home resident,who presented with chest pain and uncontrolled hypertension. Patient is known to have dementia, chronic kidney disease andhypertension.Apparently patient hadbeen refusing his medications at the skilled nursing facility. On his initial physical examination blood pressure 170/68, heart rate 83, respiratory 21, oxygen saturation was 99%, temperature 98.His lungs were clear to auscultation bilaterally, heart sounds presentandrhythmic, abdomen was soft nontender, no lower extremity edema.  Patient was admitted to the hospital working diagnosis uncontrolled hypertension.  While in hospital reported to be suicidal and homicidal.He was seen by psychiatry, recommend IV Depakote,and inpatient geropsychiatric facility.  Assessment & Plan:   Principal Problem:   Dementia Active Problems:   Hypertensive urgency   CKD stage 3 secondary to diabetes (HCC)   Chest pain   Noncompliance with medication regimen   Agitation   Elevated troponin   1.Dementia,with suicidal/sagittal ideation.With sitter one to one plus suicidal precautions. Continue withdivaloprex andmirtazapine, melatonin.  2.Hypertension.On amlodipine, carvedilol, clonidine, isosorbide.Systolic blood pressure 010.   3.Chronic diastolic heart failure.No clinical signs of exacerbation.   4.Esophagitis.On ranitidine. Completed course of antifungal agents.  5.Type 2 diabetes mellitus.Oninsulin sliding scale for glucose cover and monitoring. Noted hyperglycemia at pm, will add low dose of insulin glargine in am 5 units and continue 10 units at pm.   6.Chronic kidney disease stage III.Will follow renal function as needed, patient tolerating po well.   7.Stage III rectal adenocarcinoma.Using topical steroid.No pain or bleeding. Will need  outpatient follow up.   DVT prophylaxis:enoxaparion Code Status:full Family Communication:No family at the bedside Disposition Plan:psych unit   Consultants:  psych  Procedures:    Antimicrobials    Subjective: Patient feeling well, no nausea or vomiting, no chest pain or dyspnea.   Objective: Vitals:   09/26/17 2105 09/27/17 1437 09/27/17 2022 09/28/17 0940  BP:  (!) 139/49 (!) 145/57 (!) 163/64  Pulse: 62 63 67 68  Resp: 18 18 16    Temp: 97.7 F (36.5 C) 98.4 F (36.9 C) (!) 97.2 F (36.2 C)   TempSrc: Oral Oral Oral   SpO2: 98% 98% 96%   Weight:      Height:        Intake/Output Summary (Last 24 hours) at 09/28/2017 0946 Last data filed at 09/28/2017 0931 Gross per 24 hour  Intake 1147 ml  Output 700 ml  Net 447 ml   Filed Weights   09/20/17 1056 09/21/17 0700 09/22/17 0445  Weight: 94.8 kg (208 lb 14.4 oz) 96 kg (211 lb 10.3 oz) 95.6 kg (210 lb 12.2 oz)    Examination:   General: Not in pain or dyspnea, deconditioned Neurology: Awake and alert, non focal  E ENT: no pallor, no icterus, oral mucosa moist Cardiovascular: No JVD. S1-S2 present, rhythmic, no gallops, rubs, or murmurs. No lower extremity edema. Pulmonary: vesicular breath sounds bilaterally, adequate air movement, no wheezing, rhonchi or rales. Gastrointestinal. Abdomen flat, no organomegaly, non tender, no rebound or guarding Skin. No rashes Musculoskeletal: no joint deformities. Bilateral BKA.      Data Reviewed: I have personally reviewed following labs and imaging studies  CBC: No results for input(s): WBC, NEUTROABS, HGB, HCT, MCV, PLT in the last 168 hours. Basic Metabolic Panel: Recent Labs  Lab 09/23/17 0618 09/24/17 0732  NA 132* 132*  K 4.3 4.5  CL 98* 100*  CO2 26 22  GLUCOSE 205* 176*  BUN 41*  41*  CREATININE 2.03* 1.88*  CALCIUM 8.4* 8.4*   GFR: Estimated Creatinine Clearance: 38.3 mL/min (A) (by C-G formula based on SCr of 1.88 mg/dL  (H)). Liver Function Tests: No results for input(s): AST, ALT, ALKPHOS, BILITOT, PROT, ALBUMIN in the last 168 hours. No results for input(s): LIPASE, AMYLASE in the last 168 hours. No results for input(s): AMMONIA in the last 168 hours. Coagulation Profile: No results for input(s): INR, PROTIME in the last 168 hours. Cardiac Enzymes: No results for input(s): CKTOTAL, CKMB, CKMBINDEX, TROPONINI in the last 168 hours. BNP (last 3 results) No results for input(s): PROBNP in the last 8760 hours. HbA1C: No results for input(s): HGBA1C in the last 72 hours. CBG: Recent Labs  Lab 09/27/17 0918 09/27/17 1143 09/27/17 1655 09/27/17 2018 09/27/17 2209  GLUCAP 148* 224* 201* 244* 194*   Lipid Profile: No results for input(s): CHOL, HDL, LDLCALC, TRIG, CHOLHDL, LDLDIRECT in the last 72 hours. Thyroid Function Tests: No results for input(s): TSH, T4TOTAL, FREET4, T3FREE, THYROIDAB in the last 72 hours. Anemia Panel: No results for input(s): VITAMINB12, FOLATE, FERRITIN, TIBC, IRON, RETICCTPCT in the last 72 hours.    Radiology Studies: I have reviewed all of the imaging during this hospital visit personally     Scheduled Meds: . amLODipine  10 mg Oral Daily  . aspirin EC  325 mg Oral Daily  . atorvastatin  40 mg Oral QHS  . carvedilol  12.5 mg Oral BID WC  . cloNIDine  0.1 mg Transdermal Weekly  . divalproex  125 mg Oral Q12H  . enoxaparin (LOVENOX) injection  40 mg Subcutaneous Daily  . famotidine  20 mg Oral QHS  . feeding supplement (GLUCERNA SHAKE)  237 mL Oral TID BM  . finasteride  5 mg Oral Daily  . fluconazole  100 mg Oral Daily  . gabapentin  100 mg Oral QHS  . insulin aspart  0-9 Units Subcutaneous TID WC  . insulin glargine  15 Units Subcutaneous QHS  . isosorbide mononitrate  30 mg Oral Daily  . lactulose  30 g Oral BID  . Melatonin  3 mg Oral QHS  . mirtazapine  7.5 mg Oral QHS  . nystatin  5 mL Oral QID  . polyethylene glycol  17 g Oral Daily  . sodium  chloride flush  3 mL Intravenous Q12H  . tamsulosin  0.4 mg Oral Daily   Continuous Infusions:   LOS: 22 days        Mashell Sieben [name], MD [facility] Pager [phone]

## 2017-09-28 NOTE — Consult Note (Addendum)
[facility] Psych Consult Progress Note  09/28/2017 8:08 PM Shawn Pearson  MRN:  [id number] Subjective:   Shawn Pearson reports that he is doing "bad" because his stomach, head and chest hurt. He reports that he has not eaten anything because he does not like the food here. When asked about what he likes he responded, "real food." He would not engage in further conversation when asked about his favorite foods. He reported that he was not going to answer "stupid questions."   Principal Problem: Dementia Diagnosis:   Patient Active Problem List   Diagnosis Date Noted  . Elevated troponin [R74.8]   . Agitation [R45.1]   . Dementia [F03.90] 09/06/2017  . Noncompliance with medication regimen [Z91.14] 09/06/2017  . Anemia [D64.9] 08/30/2017  . Hypertension [I10] 08/30/2017  . Hypertensive urgency [I16.0] 08/21/2017  . DM2 (diabetes mellitus, type 2) ([place]) [E11.9] 08/21/2017  . CKD stage 3 secondary to diabetes ([place]) [W09.81, N18.3] 08/21/2017  . 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 Hypertensive emergency [I16.1] 08/21/2017  . Chest pain [R07.9]    Total Time spent with patient: 15 minutes  Past Psychiatric History: Dementia   Past Medical History:  Past Medical History:  Diagnosis Date  . Aortic atherosclerosis ([place])   . Dementia   . Diabetes mellitus without complication ([place])   . History of angina   . Hypertension   . Renal disorder    CKD stage 3    Past Surgical History:  Procedure Laterality Date  . BELOW KNEE LEG AMPUTATION Bilateral    Family History: History reviewed. No pertinent family history. Family Psychiatric  History: Unknown  Social History:  Social History   Substance and Sexual Activity  Alcohol Use No     Social History   Substance and Sexual Activity  Drug Use No    Social History   Socioeconomic History  . Marital status: Widowed    Spouse name: None  . Number of children: None  . Years of education: None  . Highest  education level: None  Social Needs  . Financial resource strain: None  . Food insecurity - worry: None  . Food insecurity - inability: None  . Transportation needs - medical: None  . Transportation needs - non-medical: None  Occupational History  . None  Tobacco Use  . Smoking status: Never Smoker  . Smokeless tobacco: Never Used  Substance and Sexual Activity  . Alcohol use: No  . Drug use: No  . Sexual activity: No  Other Topics Concern  . None  Social History Narrative  . None    Sleep: Good  Appetite:  Fair  Current Medications: Current Facility-Administered Medications  Medication Dose Route Frequency Provider Last Rate Last Dose  . acetaminophen (TYLENOL) tablet 650 mg  650 mg Oral Q4H PRN Phillips Grout, MD   650 mg at 09/24/17 1523  . amLODipine (NORVASC) tablet 10 mg  10 mg Oral Daily Patrecia Pour, MD   10 mg at 09/28/17 0944  . aspirin EC tablet 325 mg  325 mg Oral Daily Derrill Kay A, MD   325 mg at 09/28/17 0945  . atorvastatin (LIPITOR) tablet 40 mg  40 mg Oral QHS Phillips Grout, MD   40 mg at 09/27/17 2156  . carvedilol (COREG) tablet 12.5 mg  12.5 mg Oral BID WC Alphonzo Grieve, MD   12.5 mg at 09/28/17 1634  . cloNIDine (CATAPRES - Dosed in mg/24 hr) patch 0.1 mg  0.1 mg  Transdermal Weekly Regalado, Belkys A, MD   0.1 mg at 09/24/17 1004  . divalproex (DEPAKOTE SPRINKLE) capsule 125 mg  125 mg Oral Q12H Patrecia Pour, MD   125 mg at 09/28/17 0944  . enoxaparin (LOVENOX) injection 40 mg  40 mg Subcutaneous Daily Theodis Blaze, MD   40 mg at 09/28/17 0954  . famotidine (PEPCID) tablet 20 mg  20 mg Oral QHS Arrien, Jimmy Picket, MD   20 mg at 09/27/17 2156  . feeding supplement (GLUCERNA SHAKE) (GLUCERNA SHAKE) liquid 237 mL  237 mL Oral TID BM Mariel Aloe, MD   237 mL at 09/28/17 0951  . finasteride (PROSCAR) tablet 5 mg  5 mg Oral Daily Derrill Kay A, MD   5 mg at 09/28/17 0943  . gabapentin (NEURONTIN) capsule 100 mg  100 mg Oral QHS Vance Gather B, MD   100 mg at 09/27/17 2156  . hydrocortisone (ANUSOL-HC) 2.5 % rectal cream   Rectal PRN Patrecia Pour, MD      . insulin aspart (novoLOG) injection 0-9 Units  0-9 Units Subcutaneous TID WC Theodis Blaze, MD   5 Units at 09/28/17 1237  . insulin glargine (LANTUS) injection 15 Units  15 Units Subcutaneous QHS Mariel Aloe, MD   15 Units at 09/27/17 2155  . insulin glargine (LANTUS) injection 5 Units  5 Units Subcutaneous Daily Arrien, Jimmy Picket, MD   5 Units at 09/28/17 1237  . isosorbide mononitrate (IMDUR) 24 hr tablet 30 mg  30 mg Oral Daily Regalado, Belkys A, MD   30 mg at 09/28/17 0944  . Melatonin TABS 3 mg  3 mg Oral QHS Regalado, Belkys A, MD   3 mg at 09/27/17 2156  . mirtazapine (REMERON) tablet 7.5 mg  7.5 mg Oral QHS Patrecia Pour, MD   7.5 mg at 09/27/17 2156  . polyethylene glycol (MIRALAX / GLYCOLAX) packet 17 g  17 g Oral Daily Janett Billow, RPH   17 g at 09/28/17 0950  . polyvinyl alcohol (LIQUIFILM TEARS) 1.4 % ophthalmic solution 1 drop  1 drop Both Eyes PRN Dana Allan I, MD   1 drop at 09/24/17 0831  . simethicone (MYLICON) chewable tablet 80 mg  80 mg Oral QID PRN Tawni Millers, MD   80 mg at 09/27/17 1702  . tamsulosin (FLOMAX) capsule 0.4 mg  0.4 mg Oral Daily Derrill Kay A, MD   0.4 mg at 09/28/17 7371    Lab Results:  Results for orders placed or performed during the hospital encounter of 09/05/17 (from the past 48 hour(s))  Glucose, capillary     Status: Abnormal   Collection Time: 09/26/17  9:11 PM  Result Value Ref Range   Glucose-Capillary 186 (H) 65 - 99 mg/dL  Glucose, capillary     Status: Abnormal   Collection Time: 09/27/17  9:18 AM  Result Value Ref Range   Glucose-Capillary 148 (H) 65 - 99 mg/dL  Glucose, capillary     Status: Abnormal   Collection Time: 09/27/17 11:43 AM  Result Value Ref Range   Glucose-Capillary 224 (H) 65 - 99 mg/dL  Glucose, capillary     Status: Abnormal   Collection Time: 09/27/17   4:55 PM  Result Value Ref Range   Glucose-Capillary 201 (H) 65 - 99 mg/dL  Glucose, capillary     Status: Abnormal   Collection Time: 09/27/17  8:18 PM  Result Value Ref Range   Glucose-Capillary 244 (H) 65 -  99 mg/dL  Glucose, capillary     Status: Abnormal   Collection Time: 09/27/17 10:09 PM  Result Value Ref Range   Glucose-Capillary 194 (H) 65 - 99 mg/dL  Glucose, capillary     Status: Abnormal   Collection Time: 09/28/17 12:13 PM  Result Value Ref Range   Glucose-Capillary 260 (H) 65 - 99 mg/dL    Blood Alcohol level:  No results found for: [facility]   Musculoskeletal: Strength & Muscle Tone:Decreased secondary to physical deconditioning.  Gait & Station:He hasbilateral BKA. Patient leans:N/A  Psychiatric Specialty Exam: Physical Exam  Nursing note and vitals reviewed. Constitutional: He appears well-developed and well-nourished.  HENT:  Head: Normocephalic and atraumatic.  Neck: Normal range of motion.  Respiratory: Effort normal.  Musculoskeletal: Normal range of motion.  Neurological: He is alert.  Oriented to person.   Skin: No rash noted.  Psychiatric: His speech is normal. Thought content normal. His affect is labile. He is agitated. Cognition and memory are impaired. He expresses impulsivity.    Review of Systems  Cardiovascular: Positive for chest pain.  Gastrointestinal: Positive for abdominal pain.  Neurological: Positive for headaches.  Unable to fully assess since patient refused to answer further questions.  Blood pressure (!) 154/60, pulse 70, temperature 99.5 F (37.5 C), temperature source Oral, resp. rate 18, height 6\' 2"  (1.88 m), weight 95.6 kg (210 lb 12.2 oz), SpO2 95 %.Body mass index is 27.06 kg/m.  General Appearance: Fairly Groomed, African American male with bilateral BKA and unshaved face who is lying in bed with a hospital gown. NAD.  Eye Contact:  None  Speech:  Normal Rate  Volume:  Increased  Mood:  "Bad"  Affect:  Irritable   Thought Process:  Linear  Orientation:  Other:  Person  Thought Content:  He reports multiple somatic complaints.  Suicidal Thoughts:  UTA since patient refused to participate in interview.   Homicidal Thoughts:  UTA since patient refused to participate in interview.   Memory:  Immediate;   Poor Recent;   Poor Remote;   Poor  Judgement:  Impaired  Insight:  Lacking  Psychomotor Activity:  Decreased  Concentration:  Concentration: Poor and Attention Span: Poor  Recall:  Poor  Fund of Knowledge:  Poor  Language:  Poor  Akathisia:  No  Handed:  Right  AIMS (if indicated):   N/A  Assets:  Housing  ADL's:  Impaired  Cognition:  Impaired due to dementia.   Sleep:   Okay   Assessment:  Shawn Pearson is a 76 y.o. male who was admitted with withchest pain in the setting of uncontrolled blood pressure secondary to poor medication compliance. His behavior appears to have improved and he is more willing to engage in interview although still becomes irritated when asked multiple questions. He has been consistently accepting most of his medications. Heis tolerating Depakote and Remeron. His appetite continues to remain poor but he has adequate fluid intake with Ensure.    Treatment Plan Summary: -ContinueDepakote125 mg BIDfor agitation.Can titrate evening dose by 125 mg increments as clinically indicated for worsening agitation.  -Continue Remeron 7.5 mg qhs for irritability and appetite stimulation. -Avoid medications that can worsen confusion and agitation such as antihistamines, anticholingerics and benzodiazepines. Benzodiazpeines can also cause disinhibition. -ContinueMelatonin3mg  qhs to regulate sleep/wake cycle. Encourage patient to stay awake in the daytime so that he may sleep at night. -Antipsychotic use is not advised given prolonged QTc and risk for arrhthymias (QTc 559on 11/23, increased from 526 on 11/17). -  Usesoft restraints as needed for imminent risk of harming  self or others and if needed for emergent treatment. -Recommend geropsychiatric hospitalization for further stabilization and treatment. -Psychiatry will follow as clinically needed.    Faythe Dingwall, DO 09/28/2017, 8:08 PM

## 2017-09-29 LAB — GLUCOSE, CAPILLARY
GLUCOSE-CAPILLARY: 148 mg/dL — AB (ref 65–99)
GLUCOSE-CAPILLARY: 367 mg/dL — AB (ref 65–99)
Glucose-Capillary: 205 mg/dL — ABNORMAL HIGH (ref 65–99)
Glucose-Capillary: 214 mg/dL — ABNORMAL HIGH (ref 65–99)

## 2017-09-29 NOTE — Progress Notes (Signed)
CSW confirmed patient is still on the [facility] waitlist.  [name] [facility] [phone]

## 2017-09-29 NOTE — Progress Notes (Signed)
PROGRESS NOTE    Shawn Pearson  IWL:[id number] DOB: 1941/02/01 DOA: 09/05/2017 PCP: Mabeline Caras, NP    Brief Narrative:  76 year old male,nursing home resident,who presented with chest pain and uncontrolled hypertension. Patient is known to have dementia, chronic kidney disease andhypertension.Apparently patient hadbeen refusing his medications at the skilled nursing facility. On his initial physical examination blood pressure 170/68, heart rate 83, respiratory 21, oxygen saturation was 99%, temperature 98.His lungs were clear to auscultation bilaterally, heart sounds presentandrhythmic, abdomen was soft nontender, no lower extremity edema.  Patient was admitted to the hospital working diagnosis uncontrolled hypertension.  While in hospital reported to be suicidal and homicidal.He was seen by psychiatry, recommend IV Depakote,and inpatient geropsychiatric facility.    Assessment & Plan:   Principal Problem:   Dementia Active Problems:   Hypertensive urgency   CKD stage 3 secondary to diabetes (HCC)   Chest pain   Noncompliance with medication regimen   Agitation   Elevated troponin   1.Dementia,with suicidal/sagittal ideation.Continue sitter one to one plussuicidal precautions. Ondivaloprexandmirtazapine, melatonin.Waiting placement at psychiatric facility.   2.Hypertension.Continue amlodipine, carvedilol, clonidine, isosorbide.  3.Chronic diastolic heart failure.Stable with no clinical signs of exacerbation.  4.Esophagitis. Continue with ranitidine.  5.Type 2 diabetes mellitus.Capillary glucose  260, 237, 148, 205. Will continue basal insulin and sliding scale. Patient tolerating po well.  6.Chronic kidney disease stage III.Stable, avoid nephrotoxic medications.  7.Stage III rectal adenocarcinoma.stable, will need follow up as outpatient.   DVT prophylaxis:enoxaparion Code Status:full Family Communication:No family at  the bedside Disposition Plan:psych unit   Consultants:  psych  Procedures:    Antimicrobials      Subjective: Patient with no chest pain, no nausea or vomiting, no dyspnea or chest pain.  Objective: Vitals:   09/28/17 1410 09/28/17 1633 09/28/17 2152 09/29/17 0827  BP: (!) 148/44 (!) 154/60 (!) 159/67 (!) 165/69  Pulse: 74 70 71 67  Resp: 18  20   Temp: 99.5 F (37.5 C)  (!) 97.4 F (36.3 C)   TempSrc: Oral  Oral   SpO2: 95%  92%   Weight:      Height:        Intake/Output Summary (Last 24 hours) at 09/29/2017 1219 Last data filed at 09/29/2017 0900 Gross per 24 hour  Intake 540 ml  Output 1180 ml  Net -640 ml   Filed Weights   09/20/17 1056 09/21/17 0700 09/22/17 0445  Weight: 94.8 kg (208 lb 14.4 oz) 96 kg (211 lb 10.3 oz) 95.6 kg (210 lb 12.2 oz)    Examination:   General: Not in pain or dyspnea, deconditioned Neurology: Awake and alert, non focal  E ENT: no pallor, no icterus, oral mucosa moist Cardiovascular: No JVD. S1-S2 present, rhythmic, no gallops, rubs, or murmurs. No lower extremity edema. Pulmonary: vesicular breath sounds bilaterally, adequate air movement, no wheezing, rhonchi or rales. Gastrointestinal. Abdomen protuberant, no organomegaly, non tender, no rebound or guarding Skin. No rashes Musculoskeletal: no joint deformities/ bilateral BKA.      Data Reviewed: I have personally reviewed following labs and imaging studies  CBC: No results for input(s): WBC, NEUTROABS, HGB, HCT, MCV, PLT in the last 168 hours. Basic Metabolic Panel: Recent Labs  Lab 09/23/17 0618 09/24/17 0732  NA 132* 132*  K 4.3 4.5  CL 98* 100*  CO2 26 22  GLUCOSE 205* 176*  BUN 41* 41*  CREATININE 2.03* 1.88*  CALCIUM 8.4* 8.4*   GFR: Estimated Creatinine Clearance: 38.3 mL/min (A) (by C-G formula based on SCr  of 1.88 mg/dL (H)). Liver Function Tests: No results for input(s): AST, ALT, ALKPHOS, BILITOT, PROT, ALBUMIN in the last 168  hours. No results for input(s): LIPASE, AMYLASE in the last 168 hours. No results for input(s): AMMONIA in the last 168 hours. Coagulation Profile: No results for input(s): INR, PROTIME in the last 168 hours. Cardiac Enzymes: No results for input(s): CKTOTAL, CKMB, CKMBINDEX, TROPONINI in the last 168 hours. BNP (last 3 results) No results for input(s): PROBNP in the last 8760 hours. HbA1C: No results for input(s): HGBA1C in the last 72 hours. CBG: Recent Labs  Lab 09/27/17 2018 09/27/17 2209 09/28/17 1213 09/28/17 2154 09/29/17 0828  GLUCAP 244* 194* 260* 237* 148*   Lipid Profile: No results for input(s): CHOL, HDL, LDLCALC, TRIG, CHOLHDL, LDLDIRECT in the last 72 hours. Thyroid Function Tests: No results for input(s): TSH, T4TOTAL, FREET4, T3FREE, THYROIDAB in the last 72 hours. Anemia Panel: No results for input(s): VITAMINB12, FOLATE, FERRITIN, TIBC, IRON, RETICCTPCT in the last 72 hours.    Radiology Studies: I have reviewed all of the imaging during this hospital visit personally     Scheduled Meds: . amLODipine  10 mg Oral Daily  . aspirin EC  325 mg Oral Daily  . atorvastatin  40 mg Oral QHS  . carvedilol  12.5 mg Oral BID WC  . cloNIDine  0.1 mg Transdermal Weekly  . divalproex  125 mg Oral Q12H  . enoxaparin (LOVENOX) injection  40 mg Subcutaneous Daily  . famotidine  20 mg Oral QHS  . feeding supplement (GLUCERNA SHAKE)  237 mL Oral TID BM  . finasteride  5 mg Oral Daily  . gabapentin  100 mg Oral QHS  . insulin aspart  0-9 Units Subcutaneous TID WC  . insulin glargine  15 Units Subcutaneous QHS  . insulin glargine  5 Units Subcutaneous Daily  . isosorbide mononitrate  30 mg Oral Daily  . Melatonin  3 mg Oral QHS  . mirtazapine  7.5 mg Oral QHS  . polyethylene glycol  17 g Oral Daily  . tamsulosin  0.4 mg Oral Daily   Continuous Infusions:   LOS: 23 days        Annalucia Laino [name], MD [facility] Pager [phone]

## 2017-09-29 NOTE — Progress Notes (Signed)
Patient was complaining of having difficulty urinating despite adequate output today and overnight from condom catheter drainage bag. Nurse bladder scanned patient to see if he was retaining urine. Bladder scan showed 0 mL. Results were explained to patient, who responded with "Shut your damn mouth and get out! You ain't shit you fucking slut." Unit director made aware of situation.

## 2017-09-30 LAB — GLUCOSE, CAPILLARY
GLUCOSE-CAPILLARY: 129 mg/dL — AB (ref 65–99)
GLUCOSE-CAPILLARY: 55 mg/dL — AB (ref 65–99)
Glucose-Capillary: 136 mg/dL — ABNORMAL HIGH (ref 65–99)
Glucose-Capillary: 202 mg/dL — ABNORMAL HIGH (ref 65–99)
Glucose-Capillary: 103 mg/dL — ABNORMAL HIGH (ref 65–99)

## 2017-09-30 MED ORDER — INSULIN GLARGINE 100 UNIT/ML ~~LOC~~ SOLN
10.0000 [IU] | Freq: Every day | SUBCUTANEOUS | Status: DC
Start: 2017-10-01 — End: 2017-10-02
  Administered 2017-10-01: 10 [IU] via SUBCUTANEOUS
  Filled 2017-09-30: qty 0.1

## 2017-09-30 NOTE — Progress Notes (Signed)
PROGRESS NOTE  Shawn Pearson WPY:[id number] DOB: September 23, 1941 DOA: 09/05/2017 PCP: Mabeline Caras, NP  HPI/Recap of past 27 hours: 76 year old male,nursing home resident,who presented with chest pain and uncontrolled hypertension. Patient is known to have dementia, chronic kidney disease andhypertension.Apparently patient hadbeen refusing his medications at the skilled nursing facility. On his initial physical examination blood pressure 170/68, heart rate 83, respiratory 21, oxygen saturation was 99%, temperature 98.His lungs were clear to auscultation bilaterally, heart sounds presentandrhythmic, abdomen was soft nontender, no lower extremity edema. Patient was admitted to the hospital working diagnosis uncontrolled hypertension. While in hospital reported to be suicidal and homicidal.He was seen by psychiatry, recommend IV Depakote,and inpatient geropsychiatric facility. Awaiting placement  Today, pt noted to be agitated and verbally abusive at me when questions were asked. Pt refused any examination and also refused to answer my questions.  Assessment/Plan: Principal Problem:   Dementia Active Problems:   Hypertensive urgency   CKD stage 3 secondary to diabetes (HCC)   Chest pain   Noncompliance with medication regimen   Agitation   Elevated troponin  #Dementia,with suicidal/homicidal ideation Continue sitter one to one plussuicidal precautions.Ondivaloprexandmirtazapine, melatonin. Waiting placement at psychiatric facility  #Hypertension Controlled Continue amlodipine, carvedilol, clonidine, isosorbide.  #Chronic diastolic heart failure Stable with no clinical signs of exacerbation  #Esophagitis Continue withranitidine  #Type 2 diabetes mellitus Continue basal insulin and sliding scale. Patient tolerating po well.  #Chronic kidney disease stage III Stable, avoid nephrotoxic medications.  #Stage III rectal adenocarcinoma. Will need follow up as  outpatient.    Code Status: Full  Family Communication: None at bedside  Disposition Plan: Inpatient psych unit once available   Consultants:  Psych  Procedures:  None  Antimicrobials:  None   DVT prophylaxis:  Lovenox   Objective: Vitals:   09/29/17 1728 09/29/17 2208 09/29/17 2211 09/30/17 0621  BP: (!) 159/58 (!) 133/42 (!) 139/44 (!) 158/60  Pulse: 72 74 72 72  Resp:  (!) 24  (!) 24  Temp:  98.2 F (36.8 C)  98.3 F (36.8 C)  TempSrc:  Oral  Oral  SpO2:  98%  94%  Weight:      Height:        Intake/Output Summary (Last 24 hours) at 09/30/2017 1137 Last data filed at 09/30/2017 0703 Gross per 24 hour  Intake 2280 ml  Output 1780 ml  Net 500 ml   Filed Weights   09/20/17 1056 09/21/17 0700 09/22/17 0445  Weight: 94.8 kg (208 lb 14.4 oz) 96 kg (211 lb 10.3 oz) 95.6 kg (210 lb 12.2 oz)    Exam:   General: Alert, awake, not oriented, agitated, verbally abusive  Cardiovascular: Unable to assess as patient refused examination  Respiratory: Unable to assess as patient refused examination   Abdomen: Unable to assess as patient refused examination   Musculoskeletal: Unable to assess as patient refused examination    Skin: Unable to assess as patient refused examination  Psychiatry: Agitated   Data Reviewed: CBC: No results for input(s): WBC, NEUTROABS, HGB, HCT, MCV, PLT in the last 168 hours. Basic Metabolic Panel: Recent Labs  Lab 09/24/17 0732  NA 132*  K 4.5  CL 100*  CO2 22  GLUCOSE 176*  BUN 41*  CREATININE 1.88*  CALCIUM 8.4*   GFR: Estimated Creatinine Clearance: 38.3 mL/min (A) (by C-G formula based on SCr of 1.88 mg/dL (H)). Liver Function Tests: No results for input(s): AST, ALT, ALKPHOS, BILITOT, PROT, ALBUMIN in the last 168 hours. No results for  input(s): LIPASE, AMYLASE in the last 168 hours. No results for input(s): AMMONIA in the last 168 hours. Coagulation Profile: No results for input(s): INR, PROTIME in the  last 168 hours. Cardiac Enzymes: No results for input(s): CKTOTAL, CKMB, CKMBINDEX, TROPONINI in the last 168 hours. BNP (last 3 results) No results for input(s): PROBNP in the last 8760 hours. HbA1C: No results for input(s): HGBA1C in the last 72 hours. CBG: Recent Labs  Lab 09/29/17 0828 09/29/17 1258 09/29/17 1704 09/29/17 2129 09/30/17 0812  GLUCAP 148* 205* 214* 367* 136*   Lipid Profile: No results for input(s): CHOL, HDL, LDLCALC, TRIG, CHOLHDL, LDLDIRECT in the last 72 hours. Thyroid Function Tests: No results for input(s): TSH, T4TOTAL, FREET4, T3FREE, THYROIDAB in the last 72 hours. Anemia Panel: No results for input(s): VITAMINB12, FOLATE, FERRITIN, TIBC, IRON, RETICCTPCT in the last 72 hours. Urine analysis: No results found for: COLORURINE, APPEARANCEUR, LABSPEC, PHURINE, GLUCOSEU, HGBUR, BILIRUBINUR, KETONESUR, PROTEINUR, UROBILINOGEN, NITRITE, LEUKOCYTESUR Sepsis Labs: @LABRCNTIP (procalcitonin:4,lacticidven:4)  )No results found for this or any previous visit (from the past 240 hour(s)).    Studies: No results found.  Scheduled Meds: . amLODipine  10 mg Oral Daily  . aspirin EC  325 mg Oral Daily  . atorvastatin  40 mg Oral QHS  . carvedilol  12.5 mg Oral BID WC  . cloNIDine  0.1 mg Transdermal Weekly  . divalproex  125 mg Oral Q12H  . enoxaparin (LOVENOX) injection  40 mg Subcutaneous Daily  . famotidine  20 mg Oral QHS  . feeding supplement (GLUCERNA SHAKE)  237 mL Oral TID BM  . finasteride  5 mg Oral Daily  . gabapentin  100 mg Oral QHS  . insulin aspart  0-9 Units Subcutaneous TID WC  . insulin glargine  15 Units Subcutaneous QHS  . insulin glargine  5 Units Subcutaneous Daily  . isosorbide mononitrate  30 mg Oral Daily  . Melatonin  3 mg Oral QHS  . mirtazapine  7.5 mg Oral QHS  . polyethylene glycol  17 g Oral Daily  . tamsulosin  0.4 mg Oral Daily    Continuous Infusions:   LOS: 24 days     Alma Friendly, MD [facility]   If 7PM-7AM, please contact night-coverage [email or web address] Password [facility] 09/30/2017, 11:37 AM

## 2017-10-01 ENCOUNTER — Inpatient Hospital Stay (HOSPITAL_COMMUNITY): Payer: Medicare Other

## 2017-10-01 DIAGNOSIS — R0602 Shortness of breath: Secondary | ICD-10-CM

## 2017-10-01 LAB — CBC WITH DIFFERENTIAL/PLATELET
Basophils Absolute: 0 10*3/uL (ref 0.0–0.1)
Basophils Relative: 0 %
EOS ABS: 0.4 10*3/uL (ref 0.0–0.7)
Eosinophils Relative: 5 %
HEMATOCRIT: 24.8 % — AB (ref 39.0–52.0)
HEMOGLOBIN: 7.7 g/dL — AB (ref 13.0–17.0)
LYMPHS ABS: 0.9 10*3/uL (ref 0.7–4.0)
LYMPHS PCT: 12 %
MCH: 25.1 pg — AB (ref 26.0–34.0)
MCHC: 31 g/dL (ref 30.0–36.0)
MCV: 80.8 fL (ref 78.0–100.0)
MONOS PCT: 16 %
Monocytes Absolute: 1.3 10*3/uL — ABNORMAL HIGH (ref 0.1–1.0)
NEUTROS ABS: 5.4 10*3/uL (ref 1.7–7.7)
NEUTROS PCT: 67 %
Platelets: 299 10*3/uL (ref 150–400)
RBC: 3.07 MIL/uL — AB (ref 4.22–5.81)
RDW: 15.4 % (ref 11.5–15.5)
WBC: 8 10*3/uL (ref 4.0–10.5)

## 2017-10-01 LAB — BASIC METABOLIC PANEL
Anion gap: 11 (ref 5–15)
BUN: 50 mg/dL — ABNORMAL HIGH (ref 6–20)
CALCIUM: 8.4 mg/dL — AB (ref 8.9–10.3)
CO2: 23 mmol/L (ref 22–32)
Chloride: 100 mmol/L — ABNORMAL LOW (ref 101–111)
Creatinine, Ser: 1.87 mg/dL — ABNORMAL HIGH (ref 0.61–1.24)
GFR calc Af Amer: 38 mL/min — ABNORMAL LOW (ref >60)
GFR, EST NON AFRICAN AMERICAN: 33 mL/min — AB (ref >60)
GLUCOSE: 306 mg/dL — AB (ref 65–99)
POTASSIUM: 5 mmol/L (ref 3.5–5.1)
SODIUM: 134 mmol/L — AB (ref 135–145)

## 2017-10-01 LAB — GLUCOSE, CAPILLARY
GLUCOSE-CAPILLARY: 245 mg/dL — AB (ref 65–99)
GLUCOSE-CAPILLARY: 246 mg/dL — AB (ref 65–99)
Glucose-Capillary: 246 mg/dL — ABNORMAL HIGH (ref 65–99)
Glucose-Capillary: 227 mg/dL — ABNORMAL HIGH (ref 65–99)

## 2017-10-01 MED ORDER — SENNOSIDES-DOCUSATE SODIUM 8.6-50 MG PO TABS
1.0000 | ORAL_TABLET | Freq: Two times a day (BID) | ORAL | Status: DC
  Administered 2017-10-01 – 2017-10-09 (×10): 1 via ORAL
  Filled 2017-10-01 (×15): qty 1

## 2017-10-01 MED ORDER — IPRATROPIUM-ALBUTEROL 0.5-2.5 (3) MG/3ML IN SOLN
3.0000 mL | Freq: Four times a day (QID) | RESPIRATORY_TRACT | Status: DC
  Administered 2017-10-01 (×2): 3 mL via RESPIRATORY_TRACT
  Filled 2017-10-01 (×2): qty 3

## 2017-10-01 NOTE — Progress Notes (Signed)
Rn and NT attempted to get pts BP for medications when Rn touched pts arm pt swung at Rn when RNstepped back and asked the pt if he would take his medication this morning the pt replied "no, you ain't doing nothing for me here" when Nurse asked the pt again if he would pleas take his medication for his BP the pt responded "Bitch Fuck you"  Rn  Will try aging to give pt medication after pt calms down

## 2017-10-01 NOTE — Progress Notes (Signed)
Pt refused medication and assessment this morning will continue to monitor

## 2017-10-01 NOTE — Progress Notes (Signed)
PROGRESS NOTE  Shawn Pearson VPX:[id number] DOB: 09-14-1941 DOA: 09/05/2017 PCP: Mabeline Caras, NP  HPI/Recap of past 76 hours: 76 year old male,nursing home resident,who presented with chest pain and uncontrolled hypertension. Patient is known to have dementia, chronic kidney disease andhypertension.Apparently patient hadbeen refusing his medications at the skilled nursing facility. On his initial physical examination blood pressure 170/68, heart rate 83, respiratory 21, oxygen saturation was 99%, temperature 98.His lungs were clear to auscultation bilaterally, heart sounds presentandrhythmic, abdomen was soft nontender, no lower extremity edema. Patient was admitted to the hospital working diagnosis uncontrolled hypertension. While in hospital reported to be suicidal and homicidal.He was seen by psychiatry, recommend IV Depakote,and inpatient geropsychiatric facility. Awaiting placement  Today, examined patient while he was eating. Pt noted to be refusing his meds, intermittent agitation and very verbally abusive to staff/me when questions were asked. Pt also refused to answer my questions. Patient's management severely hampered by patient refusing care and sometimes being aggressive  Assessment/Plan: Principal Problem:   Dementia Active Problems:   Hypertensive urgency   CKD stage 3 secondary to diabetes ([place])   Chest pain   Noncompliance with medication regimen   Agitation   Elevated troponin  #Dementia,with suicidal/homicidal ideation Continue sitter one to one plussuicidal precautions.Ondivaloprexandmirtazapine, melatonin. Waiting placement at psychiatric facility  #Hypertension Controlled Continue amlodipine, carvedilol, clonidine, isosorbide.  #Combined systolic/diastolic CHF Stable with no clinical signs of exacerbation ECHO showed LVEF 40-45%, Grade 2 DD, hypokinesis of the inf wall CXR pending  #Esophagitis Continue withranitidine  #Normocytic  anemia Hgb baseline around 10, currently dropped to 7.7 over 1 month Monitor CBC, no signs of bleeding   #Type 2 diabetes mellitus Continue basal insulin and sliding scale. Patient tolerating po well.  #Chronic kidney disease stage III Stable, avoid nephrotoxic medications.  #Stage III rectal adenocarcinoma. Will need follow up as outpatient.    Code Status: Full  Family Communication: None at bedside  Disposition Plan: Inpatient psych unit once available   Consultants:  Psych  Procedures:  None  Antimicrobials:  None   DVT prophylaxis:  Lovenox   Objective: Vitals:   09/30/17 0621 09/30/17 1522 09/30/17 2132 10/01/17 0527  BP: (!) 158/60 (!) 137/54 100/85 (!) 168/52  Pulse: 72 63 65 70  Resp: (!) 24 20 18 20   Temp: 98.3 F (36.8 C)  97.6 F (36.4 C) 98.9 F (37.2 C)  TempSrc: Oral  Oral Oral  SpO2: 94% 94% 100% 94%  Weight:      Height:        Intake/Output Summary (Last 24 hours) at 10/01/2017 1153 Last data filed at 10/01/2017 0500 Gross per 24 hour  Intake 237 ml  Output 850 ml  Net -613 ml   Filed Weights   09/20/17 1056 09/21/17 0700 09/22/17 0445  Weight: 94.8 kg (208 lb 14.4 oz) 96 kg (211 lb 10.3 oz) 95.6 kg (210 lb 12.2 oz)    Exam:   General: Alert, awake, not oriented, agitated, verbally abusive  Cardiovascular: S1-S2 present, no added hrt sound  Respiratory: Diminished breath sound bilaterally   Abdomen: Soft, non-tender, non-distented, BS present  Musculoskeletal: No pedal edema noted    Skin: Normal  Psychiatry: Agitated, sometimes aggressive   Data Reviewed: CBC: Recent Labs  Lab 10/01/17 0622  WBC 8.0  NEUTROABS 5.4  HGB 7.7*  HCT 24.8*  MCV 80.8  PLT 462   Basic Metabolic Panel: Recent Labs  Lab 10/01/17 0622  NA 134*  K 5.0  CL 100*  CO2 23  GLUCOSE 306*  BUN 50*  CREATININE 1.87*  CALCIUM 8.4*   GFR: Estimated Creatinine Clearance: 38.5 mL/min (A) (by C-G formula based on SCr of 1.87  mg/dL (H)). Liver Function Tests: No results for input(s): AST, ALT, ALKPHOS, BILITOT, PROT, ALBUMIN in the last 168 hours. No results for input(s): LIPASE, AMYLASE in the last 168 hours. No results for input(s): AMMONIA in the last 168 hours. Coagulation Profile: No results for input(s): INR, PROTIME in the last 168 hours. Cardiac Enzymes: No results for input(s): CKTOTAL, CKMB, CKMBINDEX, TROPONINI in the last 168 hours. BNP (last 3 results) No results for input(s): PROBNP in the last 8760 hours. HbA1C: No results for input(s): HGBA1C in the last 72 hours. CBG: Recent Labs  Lab 09/30/17 1223 09/30/17 1656 09/30/17 2123 09/30/17 2219 10/01/17 0804  GLUCAP 202* 129* 55* 103* 245*   Lipid Profile: No results for input(s): CHOL, HDL, LDLCALC, TRIG, CHOLHDL, LDLDIRECT in the last 72 hours. Thyroid Function Tests: No results for input(s): TSH, T4TOTAL, FREET4, T3FREE, THYROIDAB in the last 72 hours. Anemia Panel: No results for input(s): VITAMINB12, FOLATE, FERRITIN, TIBC, IRON, RETICCTPCT in the last 72 hours. Urine analysis: No results found for: COLORURINE, APPEARANCEUR, LABSPEC, PHURINE, GLUCOSEU, HGBUR, BILIRUBINUR, KETONESUR, PROTEINUR, UROBILINOGEN, NITRITE, LEUKOCYTESUR Sepsis Labs: @LABRCNTIP (procalcitonin:4,lacticidven:4)  )No results found for this or any previous visit (from the past 240 hour(s)).    Studies: No results found.  Scheduled Meds: . amLODipine  10 mg Oral Daily  . aspirin EC  325 mg Oral Daily  . atorvastatin  40 mg Oral QHS  . carvedilol  12.5 mg Oral BID WC  . cloNIDine  0.1 mg Transdermal Weekly  . divalproex  125 mg Oral Q12H  . enoxaparin (LOVENOX) injection  40 mg Subcutaneous Daily  . famotidine  20 mg Oral QHS  . feeding supplement (GLUCERNA SHAKE)  237 mL Oral TID BM  . finasteride  5 mg Oral Daily  . gabapentin  100 mg Oral QHS  . insulin aspart  0-9 Units Subcutaneous TID WC  . insulin glargine  10 Units Subcutaneous QHS  .  ipratropium-albuterol  3 mL Nebulization Q6H  . isosorbide mononitrate  30 mg Oral Daily  . Melatonin  3 mg Oral QHS  . mirtazapine  7.5 mg Oral QHS  . polyethylene glycol  17 g Oral Daily  . tamsulosin  0.4 mg Oral Daily    Continuous Infusions:   LOS: 25 days     Alma Friendly, MD [facility]   If 7PM-7AM, please contact night-coverage [email or web address] Password [email or web address] 10/01/2017, 11:53 AM
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
## 2017-10-01 NOTE — Progress Notes (Signed)
Per safety sitter pt refused breathing treatment from RRT

## 2017-10-01 NOTE — Progress Notes (Signed)
Nutrition Follow-up  DOCUMENTATION CODES:   Obesity unspecified  INTERVENTION:  Continue Glucerna Shake po TID, each supplement provides 220 kcal and 10 grams of protein  NUTRITION DIAGNOSIS:   Inadequate oral intake related to lethargy/confusion as evidenced by meal completion < 25%. -ongoing  GOAL:   Patient will meet greater than or equal to 90% of their needs -progressing  MONITOR:   PO intake, Supplement acceptance, Labs, Weight trends, Skin, I & O's  REASON FOR ASSESSMENT:   Malnutrition Screening Tool    ASSESSMENT:   Patient is a 76 year old male with past medical history significant for dementia, CKD, HTN , medical noncompliance wth meds, neg stress test in 2/18. Patient was admitted from SNF with chest pain, uncontrolled blood pressure due to non compliance with medication and headache.  He says he does not like the nursing home he is at and that is why he refuses to take his meds. The patient has been non co operative with the Nursing staff, and still refusing his medication. Psych has been consulted as patient likely has dementia with behavioral problems.    12/1- remeron initiated by psychiatry in attempt to stimulate appetite and decrease agitation 12/3- Ensure supplement changed to Glucerna by nursing staff due to increased CBGS  Patient has been verbally abusive for the past 2 days, refusing medications. Eating most meals, 50-100% Continues on [facility] waitlist.  Weight stable as of 12/4, no new weights currently.  Labs reviewed:  CBGs 246, 245, 103 Na 134, K+ 5.0,  Medications reviewed and include:  Insulin, Melatonin, Remeron, Senokot-S, Miralax  Diet Order:  Diet Heart Room service appropriate? Yes; Fluid consistency: Thin  EDUCATION NEEDS:   Not appropriate for education at this time  Skin:  Skin Assessment: Reviewed RN Assessment  Last BM:  09/29/2017  Height:   Ht Readings from Last 1 Encounters:  09/06/17 6\' 2"  (1.88 m)    Weight:    Wt Readings from Last 1 Encounters:  09/22/17 210 lb 12.2 oz (95.6 kg)    Ideal Body Weight:  75.1 kg  BMI:  Body mass index is 27.06 kg/m.  Estimated Nutritional Needs:   Kcal:  1650-1850  Protein:  80-95 grams  Fluid:  1.6-1.8 L  Satira Anis. Jaritza Duignan, MS, RD LDN Inpatient Clinical Dietitian Pager [phone]

## 2017-10-02 LAB — GLUCOSE, CAPILLARY
GLUCOSE-CAPILLARY: 228 mg/dL — AB (ref 65–99)
GLUCOSE-CAPILLARY: 183 mg/dL — AB (ref 65–99)
Glucose-Capillary: 225 mg/dL — ABNORMAL HIGH (ref 65–99)

## 2017-10-02 LAB — OCCULT BLOOD X 1 CARD TO LAB, STOOL: FECAL OCCULT BLD: POSITIVE — AB

## 2017-10-02 MED ORDER — QUETIAPINE FUMARATE 25 MG PO TABS
12.5000 mg | ORAL_TABLET | Freq: Two times a day (BID) | ORAL | Status: DC
  Administered 2017-10-02 – 2017-10-03 (×3): 12.5 mg via ORAL
  Filled 2017-10-02 (×3): qty 1

## 2017-10-02 MED ORDER — INSULIN GLARGINE 100 UNIT/ML ~~LOC~~ SOLN
15.0000 [IU] | Freq: Every day | SUBCUTANEOUS | Status: DC
  Administered 2017-10-02 – 2017-11-05 (×30): 15 [IU] via SUBCUTANEOUS
  Filled 2017-10-02 (×36): qty 0.15

## 2017-10-02 MED ORDER — OXYCODONE HCL 5 MG PO TABS
5.0000 mg | ORAL_TABLET | Freq: Once | ORAL | Status: AC
  Administered 2017-10-02: 5 mg via ORAL
  Filled 2017-10-02: qty 1

## 2017-10-02 MED ORDER — IPRATROPIUM-ALBUTEROL 0.5-2.5 (3) MG/3ML IN SOLN
3.0000 mL | Freq: Four times a day (QID) | RESPIRATORY_TRACT | Status: DC | PRN
  Administered 2017-10-04 – 2017-10-24 (×2): 3 mL via RESPIRATORY_TRACT
  Filled 2017-10-02 (×3): qty 3

## 2017-10-02 NOTE — Progress Notes (Signed)
Pt refused CBG check after multiple attempts

## 2017-10-02 NOTE — Progress Notes (Signed)
Results for HOUSTON, ZAPIEN (MRN [id number]) as of 10/02/2017 12:11  Ref. Range 10/01/2017 08:04 10/01/2017 12:12 10/01/2017 16:58 10/01/2017 21:36 10/02/2017 08:16  Glucose-Capillary Latest Ref Range: 65 - 99 mg/dL 245 (H) 246 (H) 246 (H) 227 (H) 225 (H)  Noted that blood sugars continue to be greater than 180 mg/dl. Recommend increasing Lantus to 15 units daily if blood sugars continue to be elevated. Will continue to monitor blood sugars while in the hospital.  Harvel Ricks RN BSN CDE Diabetes Coordinator Pager: [phone]  8am-5pm
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
## 2017-10-02 NOTE — Progress Notes (Signed)
Pt refused afternoon and evening dose of insulin. Notified MD

## 2017-10-03 ENCOUNTER — Inpatient Hospital Stay (HOSPITAL_COMMUNITY): Payer: Medicare Other

## 2017-10-03 DIAGNOSIS — C189 Malignant neoplasm of colon, unspecified: Secondary | ICD-10-CM

## 2017-10-03 DIAGNOSIS — K625 Hemorrhage of anus and rectum: Secondary | ICD-10-CM

## 2017-10-03 LAB — CBC
HCT: 22.2 % — ABNORMAL LOW (ref 39.0–52.0)
HEMOGLOBIN: 7 g/dL — AB (ref 13.0–17.0)
MCH: 25.4 pg — AB (ref 26.0–34.0)
MCHC: 31.5 g/dL (ref 30.0–36.0)
MCV: 80.4 fL (ref 78.0–100.0)
Platelets: 334 10*3/uL (ref 150–400)
RBC: 2.76 MIL/uL — AB (ref 4.22–5.81)
RDW: 15.5 % (ref 11.5–15.5)
WBC: 9.3 10*3/uL (ref 4.0–10.5)

## 2017-10-03 LAB — HEMOGLOBIN AND HEMATOCRIT, BLOOD
HCT: 23.6 % — ABNORMAL LOW (ref 39.0–52.0)
Hemoglobin: 7.7 g/dL — ABNORMAL LOW (ref 13.0–17.0)

## 2017-10-03 LAB — ABO/RH: ABO/RH(D): O POS

## 2017-10-03 LAB — GLUCOSE, CAPILLARY
Glucose-Capillary: 91 mg/dL (ref 65–99)
Glucose-Capillary: 231 mg/dL — ABNORMAL HIGH (ref 65–99)

## 2017-10-03 LAB — PREPARE RBC (CROSSMATCH)

## 2017-10-03 MED ORDER — SODIUM CHLORIDE 0.9 % IV SOLN
INTRAVENOUS | Status: DC
  Administered 2017-10-03: 20:00:00 via INTRAVENOUS

## 2017-10-03 MED ORDER — SODIUM CHLORIDE 0.9 % IV SOLN
Freq: Once | INTRAVENOUS | Status: AC
  Administered 2017-10-03: 16:00:00 via INTRAVENOUS
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
## 2017-10-03 NOTE — Progress Notes (Signed)
PROGRESS NOTE  Shawn Pearson OZH:[id number] DOB: [birth year] DOA: 09/05/2017 PCP: Mabeline Caras, NP  HPI/Recap of past 37 hours: 76 year old male,nursing home resident,who presented with chest pain and uncontrolled hypertension. Patient is known to have dementia, chronic kidney disease andhypertension.Apparently patient hadbeen refusing his medications at the skilled nursing facility. On his initial physical examination blood pressure 170/68, heart rate 83, respiratory 21, oxygen saturation was 99%, temperature 98.His lungs were clear to auscultation bilaterally, heart sounds presentandrhythmic, abdomen was soft nontender, no lower extremity edema. Patient was admitted to the hospital working diagnosis uncontrolled hypertension. While in hospital reported to be suicidal and homicidal.He was seen by psychiatry, recommend IV Depakote,and inpatient geropsychiatric facility. Awaiting placement  Today, pt noted to be more calm, had 2 BM with blood+clots, c/o rectal pain. Pt denies chest pain, SOB, abdominal pain, N/V/D/C, fever/chills. Patient's management severely hampered by patient refusing care and sometimes being aggressive  Assessment/Plan: Principal Problem:   Dementia Active Problems:   Hypertensive urgency   CKD stage 3 secondary to diabetes ([place])   Chest pain   Noncompliance with medication regimen   Agitation   Elevated troponin  #Lower GI Bleed with drop in Hgb Likely due to bleeding rectal Ca diagnosed in 02/2017 Pt had 2 episodes of BRBPR + clots mixed in stool Hgb 7, slowing trending down over the past month Type and screen, transfuse 1U of PRBC GI consulted, apprec recs Daily CBC  #Dementia,with suicidal/homicidal ideation Continue sitter one to one plussuicidal precautions.Ondivaloprexandmirtazapine, melatonin. Waiting placement at psychiatric facility  #Hypertension Controlled Continue amlodipine, carvedilol, clonidine, isosorbide.  #Combined  systolic/diastolic CHF Stable with no clinical signs of exacerbation ECHO showed LVEF 40-45%, Grade 2 DD, hypokinesis of the inf wall CXR: No acute cardiopulmonary changes  #Esophagitis Continue withranitidine  #Normocytic anemia Hgb 7, will plan to transfuse Hgb baseline around 10, currently dropped to 7.7 over 1 month Monitor CBC   #Type 2 diabetes mellitus Continue basal insulin and sliding scale. Patient tolerating po well.  #Chronic kidney disease stage III Stable, avoid nephrotoxic medications.  #Stage III rectal adenocarcinoma. GI consulted for further management due to bleed    Code Status: Full  Family Communication: None at bedside  Disposition Plan: Inpatient psych unit once available   Consultants:  Psych  Procedures:  None  Antimicrobials:  None   DVT prophylaxis:  SCDs for now   Objective: Vitals:   10/02/17 1447 10/03/17 0431 10/03/17 0846 10/03/17 1457  BP: (!) 138/47 (!) 156/58 (!) 159/70 (!) 140/51  Pulse: (!) 59 64 69 75  Resp: 16 18  (!) 26  Temp: (!) 97.5 F (36.4 C) 98.4 F (36.9 C)  98.3 F (36.8 C)  TempSrc: Oral Oral  Oral  SpO2: 100% 100%  95%  Weight:      Height:        Intake/Output Summary (Last 24 hours) at 10/03/2017 1504 Last data filed at 10/03/2017 1442 Gross per 24 hour  Intake 1077 ml  Output 700 ml  Net 377 ml   Filed Weights   09/20/17 1056 09/21/17 0700 09/22/17 0445  Weight: 94.8 kg (208 lb 14.4 oz) 96 kg (211 lb 10.3 oz) 95.6 kg (210 lb 12.2 oz)    Exam:   General: Alert, awake, oriented to self, agitated  Cardiovascular: S1-S2 present, no added hrt sound  Respiratory: Diminished breath sound bilaterally   Abdomen: Soft, non-tender, non-distented, BS present  Musculoskeletal: No pedal edema noted    Skin: Normal  Psychiatry: Agitated, sometimes aggressive  Data Reviewed: CBC: Recent Labs  Lab 10/01/17 0622 10/03/17 0611  WBC 8.0 9.3  NEUTROABS 5.4  --   HGB 7.7* 7.0*    HCT 24.8* 22.2*  MCV 80.8 80.4  PLT 299 468   Basic Metabolic Panel: Recent Labs  Lab 10/01/17 0622  NA 134*  K 5.0  CL 100*  CO2 23  GLUCOSE 306*  BUN 50*  CREATININE 1.87*  CALCIUM 8.4*   GFR: Estimated Creatinine Clearance: 38.5 mL/min (A) (by C-G formula based on SCr of 1.87 mg/dL (H)). Liver Function Tests: No results for input(s): AST, ALT, ALKPHOS, BILITOT, PROT, ALBUMIN in the last 168 hours. No results for input(s): LIPASE, AMYLASE in the last 168 hours. No results for input(s): AMMONIA in the last 168 hours. Coagulation Profile: No results for input(s): INR, PROTIME in the last 168 hours. Cardiac Enzymes: No results for input(s): CKTOTAL, CKMB, CKMBINDEX, TROPONINI in the last 168 hours. BNP (last 3 results) No results for input(s): PROBNP in the last 8760 hours. HbA1C: No results for input(s): HGBA1C in the last 72 hours. CBG: Recent Labs  Lab 10/01/17 2136 10/02/17 0816 10/02/17 1834 10/02/17 2132 10/03/17 0753  GLUCAP 227* 225* 228* 183* 91   Lipid Profile: No results for input(s): CHOL, HDL, LDLCALC, TRIG, CHOLHDL, LDLDIRECT in the last 72 hours. Thyroid Function Tests: No results for input(s): TSH, T4TOTAL, FREET4, T3FREE, THYROIDAB in the last 72 hours. Anemia Panel: No results for input(s): VITAMINB12, FOLATE, FERRITIN, TIBC, IRON, RETICCTPCT in the last 72 hours. Urine analysis: No results found for: COLORURINE, APPEARANCEUR, LABSPEC, PHURINE, GLUCOSEU, HGBUR, BILIRUBINUR, KETONESUR, PROTEINUR, UROBILINOGEN, NITRITE, LEUKOCYTESUR Sepsis Labs: @LABRCNTIP (procalcitonin:4,lacticidven:4)  )No results found for this or any previous visit (from the past 240 hour(s)).    Studies: No results found.  Scheduled Meds: . amLODipine  10 mg Oral Daily  . aspirin EC  325 mg Oral Daily  . atorvastatin  40 mg Oral QHS  . carvedilol  12.5 mg Oral BID WC  . cloNIDine  0.1 mg Transdermal Weekly  . divalproex  125 mg Oral Q12H  . famotidine  20 mg Oral  QHS  . feeding supplement (GLUCERNA SHAKE)  237 mL Oral TID BM  . finasteride  5 mg Oral Daily  . gabapentin  100 mg Oral QHS  . insulin aspart  0-9 Units Subcutaneous TID WC  . insulin glargine  15 Units Subcutaneous QHS  . isosorbide mononitrate  30 mg Oral Daily  . Melatonin  3 mg Oral QHS  . mirtazapine  7.5 mg Oral QHS  . polyethylene glycol  17 g Oral Daily  . senna-docusate  1 tablet Oral BID  . tamsulosin  0.4 mg Oral Daily    Continuous Infusions: . sodium chloride       LOS: 27 days     Alma Friendly, MD [facility]   If 7PM-7AM, please contact night-coverage [email or web address] Password [email or web address] 10/03/2017, 3:04 PM

## 2017-10-03 NOTE — Progress Notes (Signed)
Patient had two occurences of bloody stool this AM. Hem 7.0. MD notified. Working to obtain IV access. Will continue to monitor.

## 2017-10-03 NOTE — Consult Note (Signed)
Consultation  Referring Provider: [facility] hospitalist/ Mathews MD Primary Care Physician:  Mabeline Caras, NP Primary Gastroenterologist:  Dr  Kaleen Mask  -[facility]  Reason for Consultation:  Anemia, bloody stool  HPI: Shawn Pearson is a 76 y.o. male , Nursing home resident who was admitted here on 09/05/2017. Initial complaint was chest pain and severe hypertension. He had apparently been refusing meds at the nursing home. He has history of vascular dementia, chronic kidney disease, hypertension, adult-onset diabetes mellitus, and coronary artery disease status post CABG. Also with an ischemic cardiomyopathy with EF of 40-45% for which she is status post ICD, severe peripheral vascular disease status post bilateral lower extremity amputations. Most of his hospitalization over the past month has been secondary to psychiatric issues with severe agitation and suicidal and homicidal behavior. He is under the care of psychiatry but by notes continues to be agitated and combative with staff. Review of his outside records shows that he underwent GI evaluation in [place] per Dr. Kaleen Mask with initial evaluation in the fall of 2017. He eventually underwent colonoscopy in May 2018 with finding of 5 polyps 3 of which were 8-20 mm in size and all resected, 2 were 10-15 mm in size and resected and also found to have a friable large large sessile appearing polypoid mass in the rectum which was biopsied and partially removed. 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 He had CT of the abdomen and pelvis done on 03/19/2017 showing small aortocaval nodes, and wall thickening in the rectum and haziness in the perirectal fat cannot exclude infiltration beyond the serosa. He had an episode of GI bleeding and had sigmoidoscopy in June 2018 and was found to be bleeding from the rectal mass this was found to be ulcerated and treated with APC ablation. Patient has history of a  chronic anemia, hemoglobin on 08/20/2017 was 10.1 hematocrit 30.8, on 1213 hemoglobin 7.7 and this morning hemoglobin down to 7. Earlier this admission iron 18 TIBC of 252 and iron saturation of 7. Patient was reported by the nurse to have 2 bloody-appearing bowel movements this morning. He has been refusing to have labs done.  Pt is aware that he has a rectal cancer, and says he almost bled to death from it this summer. He admits to some rectal pain, not severe, ongoing. Denies any abdominal  pain currently. After explanation about need for blood transfusion he is agreeable to have labs drawn.       Past Medical History:  Diagnosis Date  . Aortic atherosclerosis ([place])   . Dementia   . Diabetes mellitus without complication ([place])   . History of angina   . Hypertension   . Renal disorder    CKD stage 3    Past Surgical History:  Procedure Laterality Date  . BELOW KNEE LEG AMPUTATION Bilateral     Prior to Admission medications   Medication Sig Start Date End Date Taking? Authorizing Provider  amLODipine (NORVASC) 10 MG tablet Take 10 mg by mouth daily.   Yes [provider]  atorvastatin (LIPITOR) 40 MG tablet Take 40 mg by mouth at bedtime. 03/30/17 03/30/18 Yes [provider]  finasteride (PROSCAR) 5 MG tablet Take 5 mg by mouth daily.   Yes [provider]  furosemide (LASIX) 40 MG tablet Take 60 mg by mouth daily. 08/13/17  Yes [provider]  haloperidol lactate (HALDOL) 5 MG/ML injection Inject 2 mg every 6 (six) hours  as needed into the muscle.   Yes [provider]  Insulin Glargine (BASAGLAR KWIKPEN) 100 UNIT/ML SOPN Inject 6 Units into the skin at bedtime. 03/30/17  Yes [provider]  losartan (COZAAR) 50 MG tablet Take 50 mg by mouth daily.   Yes [provider]  nitroGLYCERIN (NITROSTAT) 0.4 MG SL tablet Place 0.4 mg as needed under the tongue for chest pain.  08/13/17  Yes [provider]    polyethylene glycol powder (GLYCOLAX/MIRALAX) powder Take 17 g by mouth daily. 08/13/17  Yes Forde Dandy, MD  tamsulosin (FLOMAX) 0.4 MG CAPS capsule Take 0.4 mg daily by mouth.  08/13/17  Yes [provider]  carvedilol (COREG) 12.5 MG tablet Take 1 tablet (12.5 mg total) 2 (two) times daily with a meal by mouth. Patient not taking: Reported on 09/05/2017 09/01/17   Raiford Noble Latif, DO  feeding supplement, GLUCERNA SHAKE, (GLUCERNA SHAKE) LIQD Take 237 mLs 2 (two) times daily between meals by mouth. Patient not taking: Reported on 09/05/2017 09/02/17   Raiford Noble Latif, DO    Current Facility-Administered Medications  Medication Dose Route Frequency Provider Last Rate Last Dose  . 0.9 %  sodium chloride infusion   Intravenous Once Alma Friendly, MD      . acetaminophen (TYLENOL) tablet 650 mg  650 mg Oral Q4H PRN Phillips Grout, MD   650 mg at 10/01/17 2157  . amLODipine (NORVASC) tablet 10 mg  10 mg Oral Daily Patrecia Pour, MD   10 mg at 10/03/17 0848  . aspirin EC tablet 325 mg  325 mg Oral Daily Phillips Grout, MD   325 mg at 10/03/17 0847  . atorvastatin (LIPITOR) tablet 40 mg  40 mg Oral QHS Phillips Grout, MD   40 mg at 10/02/17 2132  . carvedilol (COREG) tablet 12.5 mg  12.5 mg Oral BID WC Alphonzo Grieve, MD   12.5 mg at 10/03/17 0848  . cloNIDine (CATAPRES - Dosed in mg/24 hr) patch 0.1 mg  0.1 mg Transdermal Weekly Regalado, Belkys A, MD   0.1 mg at 10/01/17 1210  . divalproex (DEPAKOTE SPRINKLE) capsule 125 mg  125 mg Oral Q12H Patrecia Pour, MD   125 mg at 10/03/17 0847  . enoxaparin (LOVENOX) injection 40 mg  40 mg Subcutaneous Daily Theodis Blaze, MD   40 mg at 10/03/17 0849  . famotidine (PEPCID) tablet 20 mg  20 mg Oral QHS Arrien, Jimmy Picket, MD   20 mg at 10/02/17 2131  . feeding supplement (GLUCERNA SHAKE) (GLUCERNA SHAKE) liquid 237 mL  237 mL Oral TID BM Mariel Aloe, MD   237 mL at 10/03/17 0855  . finasteride (PROSCAR) tablet 5 mg   5 mg Oral Daily Derrill Kay A, MD   5 mg at 10/03/17 0848  . gabapentin (NEURONTIN) capsule 100 mg  100 mg Oral QHS Vance Gather B, MD   100 mg at 10/02/17 2132  . hydrocortisone (ANUSOL-HC) 2.5 % rectal cream   Rectal PRN Patrecia Pour, MD      . insulin aspart (novoLOG) injection 0-9 Units  0-9 Units Subcutaneous TID WC Theodis Blaze, MD   3 Units at 10/02/17 1847  . insulin glargine (LANTUS) injection 15 Units  15 Units Subcutaneous QHS Georgette Shell, MD   15 Units at 10/02/17 2133  . ipratropium-albuterol (DUONEB) 0.5-2.5 (3) MG/3ML nebulizer solution 3 mL  3 mL Nebulization Q6H PRN Alma Friendly, MD      .  isosorbide mononitrate (IMDUR) 24 hr tablet 30 mg  30 mg Oral Daily Regalado, Belkys A, MD   30 mg at 10/03/17 0846  . Melatonin TABS 3 mg  3 mg Oral QHS Regalado, Belkys A, MD   3 mg at 10/02/17 2132  . mirtazapine (REMERON) tablet 7.5 mg  7.5 mg Oral QHS Patrecia Pour, MD   7.5 mg at 10/02/17 2131  . polyethylene glycol (MIRALAX / GLYCOLAX) packet 17 g  17 g Oral Daily Janett Billow, RPH   17 g at 10/03/17 0848  . polyvinyl alcohol (LIQUIFILM TEARS) 1.4 % ophthalmic solution 1 drop  1 drop Both Eyes PRN Dana Allan I, MD   1 drop at 10/02/17 1959  . senna-docusate (Senokot-S) tablet 1 tablet  1 tablet Oral BID Alma Friendly, MD   1 tablet at 10/03/17 0848  . simethicone (MYLICON) chewable tablet 80 mg  80 mg Oral QID PRN Tawni Millers, MD   80 mg at 09/27/17 1702  . tamsulosin (FLOMAX) capsule 0.4 mg  0.4 mg Oral Daily Derrill Kay A, MD   0.4 mg at 10/03/17 0847    Allergies as of 09/05/2017  . (No Known Allergies)    History reviewed. No pertinent family history.  Social History   Socioeconomic History  . Marital status: Widowed    Spouse name: Not on file  . Number of children: Not on file  . Years of education: Not on file  . Highest education level: Not on file  Social Needs  . Financial resource strain: Not on file  . Food  insecurity - worry: Not on file  . Food insecurity - inability: Not on file  . Transportation needs - medical: Not on file  . Transportation needs - non-medical: Not on file  Occupational History  . Not on file  Tobacco Use  . Smoking status: Never Smoker  . Smokeless tobacco: Never Used  Substance and Sexual Activity  . Alcohol use: No  . Drug use: No  . Sexual activity: No  Other Topics Concern  . Not on file  Social History Narrative  . Not on file    Review of Systems: Pt unable to offer ROS  Physical Exam: Vital signs in last 24 hours: Temp:  [97.5 F (36.4 C)-98.4 F (36.9 C)] 98.4 F (36.9 C) (12/15 0431) Pulse Rate:  [59-69] 69 (12/15 0846) Resp:  [16-18] 18 (12/15 0431) BP: (138-159)/(47-70) 159/70 (12/15 0846) SpO2:  [100 %] 100 % (12/15 0431) Last BM Date: 10/03/17 General:   Alert,  Well-developed, ill-appearing African-American male elderly status post bilateral BKA. He is conversant but irritable Head:  Normocephalic and atraumatic. Eyes:  Sclera clear, no icterus.   Conjunctiva pink. Ears:  Normal auditory acuity. Nose:  No deformity, discharge,  or lesions. Mouth:  No deformity or lesions.   Neck:  Supple; no masses or thyromegaly. Lungs:  Clear throughout to auscultation.   No wheezes, crackles, or rhonchi. Heart:  Regular rate and rhythm; no murmurs, clicks, rubs,  or gallops. Abdomen:  Soft, obese, no focal tenderness, BS active,nonpalp mass or hsm.   Rectal:  Deferred  Msk:  Symmetrical status post bilateral BKA amputations. . Pulses:  Normal pulses noted. . Neurologic:  Alert and  oriented x4;  grossly normal neurologically. Skin:  Intact without significant lesions or rashes.. Psych:  Alert and cooperative. Irritable but appropriate  Intake/Output from previous day: 12/14 0701 - 12/15 0700 In: 237 [P.O.:237] Out: 700 [Urine:700] Intake/Output this  shift: Total I/O In: 600 [P.O.:600] Out: -   Lab Results: Recent Labs    10/01/17 0622  10/03/17 0611  WBC 8.0 9.3  HGB 7.7* 7.0*  HCT 24.8* 22.2*  PLT 299 334   BMET Recent Labs    10/01/17 0622  NA 134*  K 5.0  CL 100*  CO2 23  GLUCOSE 306*  BUN 50*  CREATININE 1.87*  CALCIUM 8.4*     IMPRESSION:  #59 76 year old African-American male with vascular dementia, nursing home resident, hospitalized over the past month, initially with chest pain and severe hypertension due to noncompliance with meds. He has had significant psychiatric issues due to his dementia which have required inpatient care. He had been homicidal and suicidal, has had ongoing psychiatric input, remains agitated and plans are for discharge to a psychiatric facility when stable. #2 acute rectal bleeding onset today-patient has a known rectal adenocarcinoma which was diagnosed in May 2018 at time of colonoscopy. He had a previous GI bleed in June 2018 and underwent sigmoidoscopy with APC ablation to the bleeding tumor. Patient says he had subsequent radiation therapy.  Recurrent bleeding is very likely secondary to the rectal cancer-, we'll also need to rule out progression  #2 chronic anemia with a 3 g drift over the past month #3 coronary artery disease status post CABG #4 chronic kidney disease #5 adult-onset diabetes mellitus #6 severe peripheral vascular disease with bilateral BKA's #7 congestive heart failure/ischemic cardiomyopathy with EF 40-45% status post ICD  Plan; Serial hemoglobins and transfuse to keep hemoglobin 8  Stop Lovenox Will discuss further with Dr. Havery Moros regarding sigmoidoscopy, as generally endoscopic intervention is ineffective for tumor bleeding. I believe he will need repeat imaging with CT of the abdomen and pelvis, and may need IR consultation for angiogram and possible embolization if he develops significant GI bleeding.   Amy Esterwood  10/03/2017, 1:23 PM

## 2017-10-03 NOTE — Progress Notes (Signed)
Patient refused his bedtime medication and for his CBG to be checked.  When this nurse attempted to encourage the patient to take his med, the patient begin yelling and cursing.  Patient begin speak  inappropriately to the nurse by stating " I want to lick your p---y and then made grabbing gestures towards RN groin area.  Nurse told patient that his behavior was inappropriate and will not be tolerate.  Patient became upset and started cursing loudly but immediately calm down whenever transportation came into the room for the CT of abdomen. Notified the charged nurse of patient's action.  Will continue to monitor the patient and notify as needed.

## 2017-10-04 DIAGNOSIS — K625 Hemorrhage of anus and rectum: Secondary | ICD-10-CM

## 2017-10-04 DIAGNOSIS — C189 Malignant neoplasm of colon, unspecified: Secondary | ICD-10-CM

## 2017-10-04 LAB — GLUCOSE, CAPILLARY
GLUCOSE-CAPILLARY: 161 mg/dL — AB (ref 65–99)
GLUCOSE-CAPILLARY: 246 mg/dL — AB (ref 65–99)

## 2017-10-04 NOTE — Progress Notes (Signed)
Pt  Refused his blood sugar check in the morning and in the evening. He states "I will the woman sitting over there." Referring to the sitter when she left for a bathroom break. Advice sitter not to go near him. Will continue to monitor.

## 2017-10-04 NOTE — Progress Notes (Signed)
Patient continues to call out requesting for the nurse to come to the room.  When this nurse approach the patient to inquire about his needs, the patient ignores the nurse.  The patient continues to be verbal  abusive to the nurse tech and nurse.  Patient threatened to pull his IV out.  Educated the patient on the importance of his  intravenous fluids but the patient began calling this nurse names and tried to push the IV pole over.  Advised the patient to remain calm and the patient closed his eyes.  Will continue to monitor the patient and notify as needed

## 2017-10-04 NOTE — Progress Notes (Signed)
Patient ID: Shawn Pearson, male   DOB: 1941/09/21, 76 y.o.   MRN: [id number]    Progress Note   Subjective   Sleeping- arouses easily - denies any abdominal pain, no further rectal bleeding today per nursing  Had 2 units yesterday - refused labs today but says its; fine to do them now.   Objective   Vital signs in last 24 hours: Temp:  [98 F (36.7 C)-98.5 F (36.9 C)] 98.3 F (36.8 C) (12/15 2149) Pulse Rate:  [65-75] 65 (12/15 2149) Resp:  [18-26] 26 (12/15 2149) BP: (140-160)/(51-64) 160/64 (12/15 2149) SpO2:  [94 %-100 %] 98 % (12/16 1125) Last BM Date: 10/03/17 General:    AA male  in NAD Heart:  Regular rate and rhythm; no murmurs Lungs: Respirations even and unlabored, lungs CTA bilaterally Abdomen:  Soft, large nontender and nondistended. Normal bowel sounds. Extremities:  Without edema. Neurologic:  Alert and oriented,  grossly normal neurologically. Psych:  Cooperative. t.  Intake/Output from previous day: 12/15 0701 - 12/16 0700 In: 1781.3 [P.O.:1340; I.V.:5.3; Blood:436] Out: 1220 [Urine:1220] Intake/Output this shift: Total I/O In: 480 [P.O.:480] Out: -   Lab Results: Recent Labs    10/03/17 0611 10/03/17 2228  WBC 9.3  --   HGB 7.0* 7.7*  HCT 22.2* 23.6*  PLT 334  --    BMET No results for input(s): NA, K, CL, CO2, GLUCOSE, BUN, CREATININE, CALCIUM in the last 72 hours. LFT No results for input(s): PROT, ALBUMIN, AST, ALT, ALKPHOS, BILITOT, BILIDIR, IBILI in the last 72 hours. PT/INR No results for input(s): LABPROT, INR in the last 72 hours.  Studies/Results: Ct Abdomen Pelvis Wo Contrast  Result Date: 10/04/2017 CLINICAL DATA:  History of rectal cancer GI bleed EXAM: CT CHEST, ABDOMEN AND PELVIS WITHOUT CONTRAST TECHNIQUE: Multidetector CT imaging of the chest, abdomen and pelvis was performed following the standard protocol without IV contrast. COMPARISON:  None. FINDINGS: CT CHEST FINDINGS Cardiovascular: Normal heart size. No pericardial  effusion. Aortic atherosclerosis. Calcification in the LAD and RCA and left circumflex coronary artery. Mediastinum/Nodes: The trachea appears patent and is midline. Right paratracheal lymph node measures 1.2 cm. There is a left paratracheal lymph node measuring 1.3 cm. No axillary or supraclavicular adenopathy. Lungs/Pleura: Small right pleural effusion with overlying calcifications identified. No suspicious pulmonary nodules identified. Musculoskeletal: No aggressive lytic or sclerotic bone lesions. CT ABDOMEN PELVIS FINDINGS Hepatobiliary: Within the limitations of unenhanced technique there is no focal liver abnormality. Pancreas: Unremarkable. No pancreatic ductal dilatation or surrounding inflammatory changes. Spleen: Normal in size without focal abnormality. Adrenals/Urinary Tract: Normal appearance of the left adrenal gland. Right adrenal nodule is identified measuring 3.4 cm and 12 HU, image 67 of series 3. Normal appearance of the right kidney. Unremarkable appearance of the left kidney. Left kidney calcifications are likely vascular in etiology. No mass or hydronephrosis identified. Stomach/Bowel: Stomach is normal. There is no pathologic dilatation of the small bowel loops. Moderate stool burden identified throughout the colon. No pathologic dilatation of the large bowel loops. Circumferential wall thickening involving the rectum is identified with wall thickness measuring up to 11 mm no obstructing mass. Vascular/Lymphatic: Aortic atherosclerosis. 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 Infrarenal abdominal aorta is increased in caliber measuring 2.7 cm. No pathologically enlarged upper abdominal lymph nodes. No pelvic or inguinal adenopathy identified. Reproductive: Prostate is unremarkable. Other: Postoperative changes involving the right common femoral artery identified. No free fluid or fluid collections within the abdomen or pelvis. Musculoskeletal: No aggressive lytic or  sclerotic  bone lesions. IMPRESSION: 1. Wall thickening involving the rectum is identified compatible with clinical history of rectal cancer. No obstructing mass identified. 2. There are no specific findings identified to suggest metastatic disease within the pelvis or distant metastatic disease to the abdomen or chest. 3. Aortic Atherosclerosis (ICD10-I70.0). Three vessel coronary artery calcifications noted. 4. Ectatic abdominal aorta at risk for aneurysm development. Recommend followup by ultrasound in 5 years. This recommendation follows ACR consensus guidelines: White Paper of the ACR Incidental Findings Committee II on Vascular Findings. J Am Coll Radiol 2013; 10:789-794. 5. 2 cm splenic artery aneurysm. 6. Small right pleural effusion with overlying pleural calcifications noted. Electronically Signed   By: Kerby Moors M.D.   On: 10/04/2017 12:07   Ct Chest Wo Contrast  Result Date: 10/04/2017 CLINICAL DATA:  History of rectal cancer GI bleed EXAM: CT CHEST, ABDOMEN AND PELVIS WITHOUT CONTRAST TECHNIQUE: Multidetector CT imaging of the chest, abdomen and pelvis was performed following the standard protocol without IV contrast. COMPARISON:  None. FINDINGS: CT CHEST FINDINGS Cardiovascular: Normal heart size. No pericardial effusion. Aortic atherosclerosis. Calcification in the LAD and RCA and left circumflex coronary artery. Mediastinum/Nodes: The trachea appears patent and is midline. Right paratracheal lymph node measures 1.2 cm. There is a left paratracheal lymph node measuring 1.3 cm. No axillary or supraclavicular adenopathy. Lungs/Pleura: Small right pleural effusion with overlying calcifications identified. No suspicious pulmonary nodules identified. Musculoskeletal: No aggressive lytic or sclerotic bone lesions. CT ABDOMEN PELVIS FINDINGS Hepatobiliary: Within the limitations of unenhanced technique there is no focal liver abnormality. Pancreas: Unremarkable. No pancreatic ductal dilatation or surrounding  inflammatory changes. Spleen: Normal in size without focal abnormality. Adrenals/Urinary Tract: Normal appearance of the left adrenal gland. Right adrenal nodule is identified measuring 3.4 cm and 12 HU, image 67 of series 3. Normal appearance of the right kidney. Unremarkable appearance of the left kidney. Left kidney calcifications are likely vascular in etiology. No mass or hydronephrosis identified. Stomach/Bowel: Stomach is normal. There is no pathologic dilatation of the small bowel loops. Moderate stool burden identified throughout the colon. No pathologic dilatation of the large bowel loops. Circumferential wall thickening involving the rectum is identified with wall thickness measuring up to 11 mm no obstructing mass. Vascular/Lymphatic: Aortic atherosclerosis. 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 Infrarenal abdominal aorta is increased in caliber measuring 2.7 cm. No pathologically enlarged upper abdominal lymph nodes. No pelvic or inguinal adenopathy identified. Reproductive: Prostate is unremarkable. Other: Postoperative changes involving the right common femoral artery identified. No free fluid or fluid collections within the abdomen or pelvis. Musculoskeletal: No aggressive lytic or sclerotic bone lesions. IMPRESSION: 1. Wall thickening involving the rectum is identified compatible with clinical history of rectal cancer. No obstructing mass identified. 2. There are no specific findings identified to suggest metastatic disease within the pelvis or distant metastatic disease to the abdomen or chest. 3. Aortic Atherosclerosis (ICD10-I70.0). Three vessel coronary artery calcifications noted. 4. Ectatic abdominal aorta at risk for aneurysm development. Recommend followup by ultrasound in 5 years. This recommendation follows ACR consensus guidelines: White Paper of the ACR Incidental Findings Committee II on Vascular Findings. J Am Coll Radiol 2013; 10:789-794. 5. 2 cm splenic artery  aneurysm. 6. Small right pleural effusion with overlying pleural calcifications noted. Electronically Signed   By: Kerby Moors M.D.   On: 10/04/2017 12:07       Assessment / Plan:    #1 76 yo AA male with rectal adenocarcinoma dx 02/2017 on colonoscopy (  [place])  Who we are seeing for drifting hgb and rectal bleeding with bloody stool x 2 yesterday   He had Flex 03/2017 [facility]) for acute bleeding and APC of partially resected rectal mass He had radiation therafter -unclear how may sessions   Ct scans yesterday do not show any metastatic disease , rectal  mass present -non obstruction  He is stable, no active bleeding  Will check hgb  Advance diet  Please ask Radiation Oncology to see him and determine if further radiation appropriate Leave off Lovenox      Contact  Amy [place], P.A.-C               [phone]      Principal Problem:   Dementia Active Problems:   Hypertensive urgency   CKD stage 3 secondary to diabetes ([place])   Chest pain   Noncompliance with medication regimen   Agitation   Elevated troponin   Malignant neoplasm of colon ([place])   Rectal bleeding     LOS: 28 days   Amy Esterwood  10/04/2017, 12:12 PM

## 2017-10-04 NOTE — Progress Notes (Signed)
Pt call out several times for nurse and when nurse approaches, he demands for nothing, but becomes verbally abusive. He refused to take his med. Educated patient on how important it is to take his med, he still insists on not accepting med.  Will continue to monitor.

## 2017-10-04 NOTE — Progress Notes (Signed)
Pt called to complain of pain on his neck, able to talk pt into taking pain med, and all morning med given along pain med. Pt accepted his morning med at this time.

## 2017-10-04 NOTE — Progress Notes (Signed)
PROGRESS NOTE  Shawn Pearson PFX:[id number] DOB: 16-Jan-1941 DOA: 09/05/2017 PCP: Mabeline Caras, NP  HPI/Recap of past 77 hours: 76 year old male,nursing home resident,who presented with chest pain and uncontrolled hypertension. Patient is known to have dementia, chronic kidney disease andhypertension.Apparently patient hadbeen refusing his medications at the skilled nursing facility. On his initial physical examination blood pressure 170/68, heart rate 83, respiratory 21, oxygen saturation was 99%, temperature 98.His lungs were clear to auscultation bilaterally, heart sounds presentandrhythmic, abdomen was soft nontender, no lower extremity edema. Patient was admitted to the hospital working diagnosis uncontrolled hypertension. While in hospital reported to be suicidal and homicidal.He was seen by psychiatry, recommend IV Depakote,and inpatient geropsychiatric facility. Awaiting placement  Today, pt reported he wanted to see a male doctor only, but I insisted. Pt reports rectal pain, denies chest pain, SOB, abdominal pain, N/V/D/C, fever/chills. Patient's management severely hampered by patient refusing care and sometimes being aggressive  Assessment/Plan: Principal Problem:   Dementia Active Problems:   Hypertensive urgency   CKD stage 3 secondary to diabetes ([place])   Chest pain   Noncompliance with medication regimen   Agitation   Elevated troponin   Malignant neoplasm of colon (HCC)   Rectal bleeding  #Lower GI Bleed with drop in Hgb Likely due to bleeding rectal Ca diagnosed in 02/2017 Pt had 2 episodes of BRBPR + clots mixed in stool Hgb 7, slowing trending down over the past month Type and screen, s/p 1U of PRBC on 10/03/17 GI consulted, apprec recs: Ordered CT abd/pelvis/chest which is pending result. Considering flex sig with hemospray for now Daily CBC  #Dementia,with suicidal/homicidal ideation Continue sitter one to one plussuicidal  precautions.Ondivaloprexandmirtazapine, melatonin. Waiting placement at psychiatric facility  #Hypertension Controlled Continue amlodipine, carvedilol, clonidine, isosorbide.  #Combined systolic/diastolic CHF Stable with no clinical signs of exacerbation ECHO showed LVEF 40-45%, Grade 2 DD, hypokinesis of the inf wall CXR: No acute cardiopulmonary changes  #Esophagitis Continue withranitidine  #Normocytic anemia S/p 1U of PRBC on 10/03/17 Hgb baseline around 10, currently dropped to 7.7 over 1 month Monitor CBC   #Type 2 diabetes mellitus Continue basal insulin and sliding scale. Patient tolerating po well.  #Chronic kidney disease stage III Stable, avoid nephrotoxic medications.  #Stage III rectal adenocarcinoma. GI consulted for further management due to bleed    Code Status: Full  Family Communication: None at bedside  Disposition Plan: Inpatient psych unit once available   Consultants:  Psych  Procedures:  None  Antimicrobials:  None   DVT prophylaxis:  SCDs for now   Objective: Vitals:   10/03/17 1645 10/03/17 2013 10/03/17 2149 10/04/17 1125  BP: (!) 148/55 (!) 153/64 (!) 160/64   Pulse: 66 65 65   Resp: (!) 23 18 (!) 26   Temp: 98 F (36.7 C) 98.1 F (36.7 C) 98.3 F (36.8 C)   TempSrc: Oral Oral Oral   SpO2: 96% 100% 94% 98%  Weight:      Height:        Intake/Output Summary (Last 24 hours) at 10/04/2017 1139 Last data filed at 10/04/2017 0923 Gross per 24 hour  Intake 1661.33 ml  Output 1220 ml  Net 441.33 ml   Filed Weights   09/20/17 1056 09/21/17 0700 09/22/17 0445  Weight: 94.8 kg (208 lb 14.4 oz) 96 kg (211 lb 10.3 oz) 95.6 kg (210 lb 12.2 oz)    Exam:   General: Alert, awake, oriented to self, agitated  Cardiovascular: S1-S2 present, no added hrt sound  Respiratory: Diminished breath  sound bilaterally   Abdomen: Soft, non-tender, non-distented, BS present  Musculoskeletal: No pedal edema noted     Skin: Normal  Psychiatry: Agitated, sometimes aggressive   Data Reviewed: CBC: Recent Labs  Lab 10/01/17 0622 10/03/17 0611 10/03/17 2228  WBC 8.0 9.3  --   NEUTROABS 5.4  --   --   HGB 7.7* 7.0* 7.7*  HCT 24.8* 22.2* 23.6*  MCV 80.8 80.4  --   PLT 299 334  --    Basic Metabolic Panel: Recent Labs  Lab 10/01/17 0622  NA 134*  K 5.0  CL 100*  CO2 23  GLUCOSE 306*  BUN 50*  CREATININE 1.87*  CALCIUM 8.4*   GFR: Estimated Creatinine Clearance: 38.5 mL/min (A) (by C-G formula based on SCr of 1.87 mg/dL (H)). Liver Function Tests: No results for input(s): AST, ALT, ALKPHOS, BILITOT, PROT, ALBUMIN in the last 168 hours. No results for input(s): LIPASE, AMYLASE in the last 168 hours. No results for input(s): AMMONIA in the last 168 hours. Coagulation Profile: No results for input(s): INR, PROTIME in the last 168 hours. Cardiac Enzymes: No results for input(s): CKTOTAL, CKMB, CKMBINDEX, TROPONINI in the last 168 hours. BNP (last 3 results) No results for input(s): PROBNP in the last 8760 hours. HbA1C: No results for input(s): HGBA1C in the last 72 hours. CBG: Recent Labs  Lab 10/02/17 0816 10/02/17 1834 10/02/17 2132 10/03/17 0753 10/03/17 1632  GLUCAP 225* 228* 183* 91 231*   Lipid Profile: No results for input(s): CHOL, HDL, LDLCALC, TRIG, CHOLHDL, LDLDIRECT in the last 72 hours. Thyroid Function Tests: No results for input(s): TSH, T4TOTAL, FREET4, T3FREE, THYROIDAB in the last 72 hours. Anemia Panel: No results for input(s): VITAMINB12, FOLATE, FERRITIN, TIBC, IRON, RETICCTPCT in the last 72 hours. Urine analysis: No results found for: COLORURINE, APPEARANCEUR, LABSPEC, PHURINE, GLUCOSEU, HGBUR, BILIRUBINUR, KETONESUR, PROTEINUR, UROBILINOGEN, NITRITE, LEUKOCYTESUR Sepsis Labs: @LABRCNTIP (procalcitonin:4,lacticidven:4)  )No results found for this or any previous visit (from the past 240 hour(s)).    Studies: No results found.  Scheduled  Meds: . amLODipine  10 mg Oral Daily  . aspirin EC  325 mg Oral Daily  . atorvastatin  40 mg Oral QHS  . carvedilol  12.5 mg Oral BID WC  . cloNIDine  0.1 mg Transdermal Weekly  . divalproex  125 mg Oral Q12H  . famotidine  20 mg Oral QHS  . feeding supplement (GLUCERNA SHAKE)  237 mL Oral TID BM  . finasteride  5 mg Oral Daily  . gabapentin  100 mg Oral QHS  . insulin aspart  0-9 Units Subcutaneous TID WC  . insulin glargine  15 Units Subcutaneous QHS  . isosorbide mononitrate  30 mg Oral Daily  . Melatonin  3 mg Oral QHS  . mirtazapine  7.5 mg Oral QHS  . polyethylene glycol  17 g Oral Daily  . senna-docusate  1 tablet Oral BID  . tamsulosin  0.4 mg Oral Daily    Continuous Infusions:    LOS: 28 days     Alma Friendly, MD [facility]   If 7PM-7AM, please contact night-coverage [email or web address] Password [facility] 10/04/2017, 11:39 AM

## 2017-10-04 NOTE — Progress Notes (Signed)
Pt complains of SOB. Respiratory therapist notified to administer breathing treatments.

## 2017-10-05 DIAGNOSIS — C20 Malignant neoplasm of rectum: Secondary | ICD-10-CM

## 2017-10-05 DIAGNOSIS — D649 Anemia, unspecified: Secondary | ICD-10-CM

## 2017-10-05 DIAGNOSIS — R109 Unspecified abdominal pain: Secondary | ICD-10-CM

## 2017-10-05 LAB — GLUCOSE, CAPILLARY
GLUCOSE-CAPILLARY: 122 mg/dL — AB (ref 65–99)
GLUCOSE-CAPILLARY: 276 mg/dL — AB (ref 65–99)
Glucose-Capillary: 181 mg/dL — ABNORMAL HIGH (ref 65–99)
Glucose-Capillary: 226 mg/dL — ABNORMAL HIGH (ref 65–99)

## 2017-10-05 LAB — HEMOGLOBIN AND HEMATOCRIT, BLOOD
HCT: 24.4 % — ABNORMAL LOW (ref 39.0–52.0)
HEMOGLOBIN: 7.8 g/dL — AB (ref 13.0–17.0)

## 2017-10-05 MED ORDER — CLONIDINE HCL 0.2 MG/24HR TD PTWK
0.2000 mg | MEDICATED_PATCH | TRANSDERMAL | Status: DC
  Administered 2017-10-19 – 2017-11-02 (×3): 0.2 mg via TRANSDERMAL
  Filled 2017-10-05 (×6): qty 1

## 2017-10-05 NOTE — Progress Notes (Signed)
          Daily Rounding Note  10/05/2017, 12:49 PM  LOS: 29 days   SUBJECTIVE:     Small dark stool today  OBJECTIVE:         Vital signs in last 24 hours:    Temp:  [97.8 F (36.6 C)-98.3 F (36.8 C)] 97.8 F (36.6 C) (12/17 0440) Pulse Rate:  [65-72] 70 (12/17 0440) Resp:  [18-30] 30 (12/17 0440) BP: (164-177)/(55-68) 177/68 (12/17 0440) SpO2:  [93 %-97 %] 96 % (12/17 0440) Last BM Date: 10/04/17 Filed Weights   09/20/17 1056 09/21/17 0700 09/22/17 0445  Weight: 94.8 kg (208 lb 14.4 oz) 96 kg (211 lb 10.3 oz) 95.6 kg (210 lb 12.2 oz)   General: resting, did not awaken pt for discussion and did not awaken for exam   Heart: RRR Chest: clear bil.  snoring Abdomen: soft, active BS.  NT  Extremities: BKA bil.  Stumps incisions well healed Neuro/Psych:  Did not awaken pt who was agitated earlier.     Lab Results: Recent Labs    10/03/17 0611 10/03/17 2228 10/05/17 0205  WBC 9.3  --   --   HGB 7.0* 7.7* 7.8*  HCT 22.2* 23.6* 24.4*  PLT 334  --   --     Studies/Results:   ASSESMENT:   RECTAL CANCER - partially treated with XRT x 5 days (he says)   *  Bloody stool.  Hx multiple colon polyps 02/2017, one of these was confirmed rectal adenocarcinoma.  By CT there was possible invasion beyond Serosa. Radiation performed not a surgical candidate.  .  Bleeding from rectal mass in 03/25/2017 sigmoidoscopy, treated with APC ablation.   10/03/17 CT:  Rectal wall thickening.  No obvious mets to abdomen or chest.  CAD, aortic PAD, ectatic aorta.    *  Acute on chronic anemia. S/p PRBC x 1 U on 12/15.  Hgb stable.      *  Vascular dementia and psychriatric issues with agitation.  Plan is for discharge to psych when stable.    *  CHF.  EF 40%.  PAD, s/p bil BKAs.  CKD.  NIDDM  *  AKI, CKD.  Improved.     PLAN   *  Flex sig??.  Dr Carlean Purl will be following pt this week.   *  Pt is full code  Should he be seen by pall  care for a goals of care discussion?  Is pt even capable of decision making and/or does he have next of kin.         Azucena Freed  10/05/2017, 12:49 PM Pager: [phone]    [facility] Attending   I have taken an interval history, reviewed the chart and examined the patient. I agree with the Advanced Practitioner's note, impression and recommendations.     I saw and interviewed him.  I have little to nothing to offer this man.  ? Is whether or not he can or will have additional treatment for his rectal cancer. Psych issues greatly impact that.  If he has severe bleeding could attempt to stop it with therapeutic flex sig but that is not the case.  Overall prognosis is poor for multiple reasons.  Maybe a better conversation about goals of care could be had after psych meds help him.  Signing off  Gatha Mayer, MD, [facility] [phone] (pager) 10/05/2017 4:56 PM

## 2017-10-05 NOTE — Progress Notes (Signed)
Pt verbally combative, refused lunch time insulin coverage for blood glucose of 181.

## 2017-10-05 NOTE — Progress Notes (Signed)
Pt very particular about what he drinks, refusing water and wants strawberry ensure to take medications and argued with staff.  Limited stock on floor delayed delivery of the strawberry ensure to the patient.  Once delivered the patient became cooperative and took all the morning medications given.

## 2017-10-05 NOTE — Progress Notes (Signed)
Pt is argumentative about any interaction, complaints of drinking out of styrofoam cups, current safety sitter is giving patient the call bell to keep calling staff to the room.

## 2017-10-05 NOTE — Progress Notes (Signed)
Pt refusing all HS medications.

## 2017-10-05 NOTE — Progress Notes (Addendum)
Explained to patient procedure prior to initiation, pt verbally agrees to plan.  Sitter in room and supportive of nurse Regulatory affairs officer) Pt unable to void, bladder scan shows 36ms,  in/out cath ordered obtained from MD.  Pt has hypospadias, met resistance with insertion of catheter, was able to advance to get urine return, flow stopped,   obtained back 30 cc yellow urine, pt complaint of pain, catheter removed, pt was cooperative at this time, will continue to monitor.

## 2017-10-05 NOTE — Care Management Important Message (Signed)
Important Message  Patient Details  Name: Shawn Pearson MRN: [id number] Date of Birth: 1941/05/08   Medicare Important Message Given:  Yes    Beverlie Kurihara Abena 10/05/2017, 10:40 AM

## 2017-10-05 NOTE — Progress Notes (Signed)
Patient refused 1 unit of  insulin needle for POCT blood glucose 122.

## 2017-10-05 NOTE — Progress Notes (Signed)
Pt complaint abd pain, security at bedside, pt agreed to take Tylenol (see EMAR), will continue to monitor.

## 2017-10-05 NOTE — Progress Notes (Signed)
Pt complaint of need to void, has not voided at this time, MD made aware, awaiting orders.

## 2017-10-05 NOTE — Consult Note (Addendum)
[facility] Psych Consult Progress Note  10/05/2017 4:08 PM Fredrick Geoghegan  MRN:  [id number] Subjective:   Mr. Treadway was asleep upon interview. He briefly opened his eyes to report belly pain. His sitter reports that he has been appropriate with her but she did witness him being aggressive to other staff prior to her shift.   Principal Problem: Dementia Diagnosis:   Patient Active Problem List   Diagnosis Date Noted  . Malignant neoplasm of colon ([place]) [C18.9]   . Rectal bleeding [K62.5]   . Elevated troponin [R74.8]   . Agitation [R45.1]   . Dementia [F03.90] 09/06/2017  . Noncompliance with medication regimen [Z91.14] 09/06/2017  . Anemia [D64.9] 08/30/2017  . Hypertension [I10] 08/30/2017  . Hypertensive urgency [I16.0] 08/21/2017  . DM2 (diabetes mellitus, type 2) ([place]) [E11.9] 08/21/2017  . CKD stage 3 secondary to diabetes ([place]) [S97.02, N18.3] 08/21/2017  . Adenocarcinoma of rectum, stage 3 ([place]) [C20] 08/21/2017  . Hypertensive emergency [I16.1] 08/21/2017  . Chest pain [R07.9]    Total Time spent with patient: 15 minutes  Past Psychiatric History: Dementia  Past Medical History:  Past Medical History:  Diagnosis Date  . Aortic atherosclerosis ([place])   . Dementia   . Diabetes mellitus without complication ([place])   . History of angina   . Hypertension   . Renal disorder    CKD stage 3    Past Surgical History:  Procedure Laterality Date  . BELOW KNEE LEG AMPUTATION Bilateral    Family History: History reviewed. No pertinent family history. Family Psychiatric  History: Unknown  Social History:  Social History   Substance and Sexual Activity  Alcohol Use No     Social History   Substance and Sexual Activity  Drug Use No    Social History   Socioeconomic History  . Marital status: Widowed    Spouse name: None  . Number of children: None  . Years of education: None  . Highest education level: None  Social Needs  . Financial resource strain: None  . Food  insecurity - worry: None  . Food insecurity - inability: None  . Transportation needs - medical: None  . Transportation needs - non-medical: None  Occupational History  . None  Tobacco Use  . Smoking status: Never Smoker  . Smokeless tobacco: Never Used  Substance and Sexual Activity  . Alcohol use: No  . Drug use: No  . Sexual activity: No  Other Topics Concern  . None  Social History Narrative  . None    Sleep: Good  Appetite:  Poor  Current Medications: Current Facility-Administered Medications  Medication Dose Route Frequency Provider Last Rate Last Dose  . acetaminophen (TYLENOL) tablet 650 mg  650 mg Oral Q4H PRN Phillips Grout, MD   650 mg at 10/05/17 1405  . amLODipine (NORVASC) tablet 10 mg  10 mg Oral Daily Patrecia Pour, MD   10 mg at 10/05/17 0907  . aspirin EC tablet 325 mg  325 mg Oral Daily Phillips Grout, MD   325 mg at 10/05/17 0908  . atorvastatin (LIPITOR) tablet 40 mg  40 mg Oral QHS Derrill Kay A, MD   40 mg at 10/04/17 2204  . carvedilol (COREG) tablet 12.5 mg  12.5 mg Oral BID WC Alphonzo Grieve, MD   12.5 mg at 10/05/17 0907  . cloNIDine (CATAPRES - Dosed in mg/24 hr) patch 0.2 mg  0.2 mg Transdermal Weekly Alma Friendly, MD      .  divalproex (DEPAKOTE SPRINKLE) capsule 125 mg  125 mg Oral Q12H Patrecia Pour, MD   125 mg at 10/05/17 1610  . famotidine (PEPCID) tablet 20 mg  20 mg Oral QHS Arrien, Jimmy Picket, MD   20 mg at 10/04/17 2205  . feeding supplement (GLUCERNA SHAKE) (GLUCERNA SHAKE) liquid 237 mL  237 mL Oral TID BM Mariel Aloe, MD   237 mL at 10/05/17 1300  . finasteride (PROSCAR) tablet 5 mg  5 mg Oral Daily Derrill Kay A, MD   5 mg at 10/05/17 0907  . gabapentin (NEURONTIN) capsule 100 mg  100 mg Oral QHS Patrecia Pour, MD   100 mg at 10/04/17 2205  . hydrocortisone (ANUSOL-HC) 2.5 % rectal cream   Rectal PRN Patrecia Pour, MD      . insulin aspart (novoLOG) injection 0-9 Units  0-9 Units Subcutaneous TID WC Theodis Blaze, MD   2 Units at 10/04/17 1224  . insulin glargine (LANTUS) injection 15 Units  15 Units Subcutaneous QHS Georgette Shell, MD   15 Units at 10/04/17 2205  . ipratropium-albuterol (DUONEB) 0.5-2.5 (3) MG/3ML nebulizer solution 3 mL  3 mL Nebulization Q6H PRN Alma Friendly, MD   3 mL at 10/04/17 1211  . isosorbide mononitrate (IMDUR) 24 hr tablet 30 mg  30 mg Oral Daily Regalado, Belkys A, MD   30 mg at 10/05/17 0907  . Melatonin TABS 3 mg  3 mg Oral QHS Regalado, Belkys A, MD   3 mg at 10/04/17 2205  . mirtazapine (REMERON) tablet 7.5 mg  7.5 mg Oral QHS Patrecia Pour, MD   7.5 mg at 10/04/17 2205  . polyethylene glycol (MIRALAX / GLYCOLAX) packet 17 g  17 g Oral Daily Janett Billow, RPH   17 g at 10/05/17 9604  . polyvinyl alcohol (LIQUIFILM TEARS) 1.4 % ophthalmic solution 1 drop  1 drop Both Eyes PRN Dana Allan I, MD   1 drop at 10/04/17 0310  . senna-docusate (Senokot-S) tablet 1 tablet  1 tablet Oral BID Alma Friendly, MD   1 tablet at 10/05/17 5409  . simethicone (MYLICON) chewable tablet 80 mg  80 mg Oral QID PRN Tawni Millers, MD   80 mg at 09/27/17 1702  . tamsulosin (FLOMAX) capsule 0.4 mg  0.4 mg Oral Daily Derrill Kay A, MD   0.4 mg at 10/05/17 0907    Lab Results:  Results for orders placed or performed during the hospital encounter of 09/05/17 (from the past 48 hour(s))  Glucose, capillary     Status: Abnormal   Collection Time: 10/03/17  4:32 PM  Result Value Ref Range   Glucose-Capillary 231 (H) 65 - 99 mg/dL  Hemoglobin and hematocrit, blood     Status: Abnormal   Collection Time: 10/03/17 10:28 PM  Result Value Ref Range   Hemoglobin 7.7 (L) 13.0 - 17.0 g/dL   HCT 23.6 (L) 39.0 - 52.0 %  Glucose, capillary     Status: Abnormal   Collection Time: 10/04/17 12:09 PM  Result Value Ref Range   Glucose-Capillary 161 (H) 65 - 99 mg/dL  Glucose, capillary     Status: Abnormal   Collection Time: 10/04/17  8:41 PM  Result Value Ref  Range   Glucose-Capillary 246 (H) 65 - 99 mg/dL  Hemoglobin and hematocrit, blood     Status: Abnormal   Collection Time: 10/05/17  2:05 AM  Result Value Ref Range   Hemoglobin 7.8 (L)  13.0 - 17.0 g/dL   HCT 24.4 (L) 39.0 - 52.0 %  Glucose, capillary     Status: Abnormal   Collection Time: 10/05/17  7:53 AM  Result Value Ref Range   Glucose-Capillary 122 (H) 65 - 99 mg/dL  Glucose, capillary     Status: Abnormal   Collection Time: 10/05/17 11:50 AM  Result Value Ref Range   Glucose-Capillary 181 (H) 65 - 99 mg/dL    Musculoskeletal: Strength & Muscle Tone:Decreased secondary to physical deconditioning. Gait & Station:He hasbilateral BKA. Patient leans:N/A  Psychiatric Specialty Exam: Physical Exam  Nursing note and vitals reviewed. Constitutional: He appears well-developed and well-nourished.  HENT:  Head: Normocephalic and atraumatic.  Neck: Normal range of motion.  Respiratory: Effort normal.  Musculoskeletal: Normal range of motion.  Neurological:  Patient was asleep and only briefly awakened.  Psychiatric: His speech is normal. His affect is labile. He is agitated. Cognition and memory are impaired. He expresses impulsivity.    Review of Systems  Gastrointestinal: Positive for abdominal pain.  Unable to fully assess since patient was briefly awake for interview.   Blood pressure (!) 150/53, pulse 70, temperature 97.8 F (36.6 C), temperature source Oral, resp. rate (!) 22, height 6\' 2"  (1.88 m), weight 95.6 kg (210 lb 12.2 oz), SpO2 98 %.Body mass index is 27.06 kg/m.  General Appearance: Fairly Groomed  Eye Contact:  Poor  Speech:  Normal Rate  Volume:  Normal  Mood:  Did not state  Affect:  Labile  Thought Process:  Linear  Orientation:  Other:  To person.  Thought Content:  Logical  Suicidal Thoughts:  UTA  Homicidal Thoughts:  UTA  Memory:  Immediate;   Poor Recent;   Poor Remote;   Poor  Judgement:  Impaired  Insight:  Lacking  Psychomotor  Activity:  Decreased  Concentration:  Concentration: Poor and Attention Span: Poor  Recall:  Poor  Fund of Knowledge:  NA  Language:  Fair  Akathisia:  No  Handed:  Right  AIMS (if indicated):   N/A  Assets:  Housing  ADL's:  Impaired  Cognition: Impaired due to dementia.   Sleep:   Okay    Assessment:  Saladin Wedin is a 76 y.o. male who was admitted with withchest pain in the setting of uncontrolled blood pressure secondary to poor medication compliance.He has episodic periods of improved behavior but he generally seems to be doing better since consistently taking his psychotropic medications. He still becomes irritated and aggressive at times and continues to warrant geropsychiatric placement.    Treatment Plan Summary: -ContinueDepakote125 mg BIDfor agitation.Can titrate evening dose by 125 mg increments as clinically indicated for worsening agitation.  -ContinueRemeron 7.5 mg qhs for irritability and appetite stimulation. -Avoid medications that can worsen confusion and agitation such as antihistamines, anticholingerics and benzodiazepines. Benzodiazpeines can alsocause disinhibition. -ContinueMelatonin3mg  qhs to regulate sleep/wake cycle. Encourage patient to stay awake in the daytime so that he may sleep at night. -Antipsychotic use is not advised given prolonged QTc and risk for arrhthymias (QTc 559on 11/23, increased from 526 on 11/17). -Usesoft restraints as needed for imminent risk of harming self or others and if needed for emergent treatment. -Recommend geropsychiatric hospitalization for further stabilization and treatment. -Psychiatry will follow as clinically needed.    Faythe Dingwall, DO 10/05/2017, 4:08 PM

## 2017-10-05 NOTE — Progress Notes (Signed)
Pt screaming at nursing staff about blood sugars, not following direction, not able to re-direct, no acute distress observed.

## 2017-10-05 NOTE — Progress Notes (Signed)
Pt combative hit nursing staff when trying to give insulin, explained to pt that behavior will not be tolerated, pt refusing insulin.

## 2017-10-05 NOTE — Progress Notes (Signed)
CSW confirmed that patient is still on [facility] waitlist.   [name] [facility] [phone]

## 2017-10-05 NOTE — Progress Notes (Signed)
PROGRESS NOTE  Shawn Pearson VQM:[id number] DOB: Sep 09, 1941 DOA: 09/05/2017 PCP: Mabeline Caras, NP  HPI/Recap of past 38 hours: 76 year old male,nursing home resident,who presented with chest pain and uncontrolled hypertension. Patient is known to have dementia, chronic kidney disease andhypertension.Apparently patient hadbeen refusing his medications at the skilled nursing facility. On his initial physical examination blood pressure 170/68, heart rate 83, respiratory 21, oxygen saturation was 99%, temperature 98.His lungs were clear to auscultation bilaterally, heart sounds presentandrhythmic, abdomen was soft nontender, no lower extremity edema. Patient was admitted to the hospital working diagnosis uncontrolled hypertension. While in hospital reported to be suicidal and homicidal.He was seen by psychiatry, recommend IV Depakote,and inpatient geropsychiatric facility. Awaiting placement  Today, pt noted to be sleeping, easily arousable, refused to answer my questions. Denies chest pain, SOB, abdominal pain, N/V/D/C, fever/chills. Patient's management severely hampered by patient refusing care and sometimes being aggressive/abusive to staff  Assessment/Plan: Principal Problem:   Dementia Active Problems:   Hypertensive urgency   CKD stage 3 secondary to diabetes [facility])   Chest pain   Noncompliance with medication regimen   Agitation   Elevated troponin   Malignant neoplasm of colon (HCC)   Rectal bleeding  #Lower GI Bleed with drop in Hgb Stable Likely due to bleeding rectal Ca diagnosed in 02/2017 Pt had 2 episodes of BRBPR + clots mixed in stool on 10/03/17 Hgb 7, slowing trending down over the past month Type and screen, s/p 1U of PRBC on 10/03/17 CT abd/pelvis/chest: Non-obstructing rectal mass, no evidence of mets in the body GI consulted, apprec recs Daily CBC  #Dementia,with suicidal/homicidal ideation Continue sitter one to one plussuicidal  precautions.Ondivaloprexandmirtazapine, melatonin. Waiting placement at psychiatric facility  #Hypertension Controlled Continue amlodipine, carvedilol, clonidine, isosorbide.  #Combined systolic/diastolic CHF Stable with no clinical signs of exacerbation ECHO showed LVEF 40-45%, Grade 2 DD, hypokinesis of the inf wall CXR: No acute cardiopulmonary changes  #Esophagitis Continue withranitidine  #Normocytic anemia Stable S/p 1U of PRBC on 10/03/17 Hgb baseline around 10, currently dropped to 7.7 over 1 month Monitor CBC   #Type 2 diabetes mellitus Continue basal insulin and sliding scale. Patient tolerating po well.  #Chronic kidney disease stage III Stable, avoid nephrotoxic medications.  #Stage III rectal adenocarcinoma. GI consulted for further management due to bleed    Code Status: Full  Family Communication: None at bedside  Disposition Plan: Inpatient psych unit once available   Consultants:  Psych  GI  Procedures:  None  Antimicrobials:  None   DVT prophylaxis:  SCDs ordered, bilateral amputation   Objective: Vitals:   10/04/17 1213 10/04/17 1522 10/04/17 2039 10/05/17 0440  BP:  (!) 166/65 (!) 164/55 (!) 177/68  Pulse:  72 65 70  Resp:  (!) 22 18 (!) 30  Temp:  98.3 F (36.8 C) 97.8 F (36.6 C) 97.8 F (36.6 C)  TempSrc:  Oral Oral Oral  SpO2: 97% 97% 93% 96%  Weight:      Height:        Intake/Output Summary (Last 24 hours) at 10/05/2017 1131 Last data filed at 10/05/2017 0900 Gross per 24 hour  Intake 720 ml  Output 1126 ml  Net -406 ml   Filed Weights   09/20/17 1056 09/21/17 0700 09/22/17 0445  Weight: 94.8 kg (208 lb 14.4 oz) 96 kg (211 lb 10.3 oz) 95.6 kg (210 lb 12.2 oz)    Exam:   General: Alert, awake, oriented to self, agitated  Cardiovascular: S1-S2 present, no added hrt sound  Respiratory:  Diminished breath sound bilaterally   Abdomen: Soft, non-tender, non-distented, BS  present  Musculoskeletal: No pedal edema noted    Skin: Normal  Psychiatry: Agitated, sometimes aggressive   Data Reviewed: CBC: Recent Labs  Lab 10/01/17 0622 10/03/17 0611 10/03/17 2228 10/05/17 0205  WBC 8.0 9.3  --   --   NEUTROABS 5.4  --   --   --   HGB 7.7* 7.0* 7.7* 7.8*  HCT 24.8* 22.2* 23.6* 24.4*  MCV 80.8 80.4  --   --   PLT 299 334  --   --    Basic Metabolic Panel: Recent Labs  Lab 10/01/17 0622  NA 134*  K 5.0  CL 100*  CO2 23  GLUCOSE 306*  BUN 50*  CREATININE 1.87*  CALCIUM 8.4*   GFR: Estimated Creatinine Clearance: 38.5 mL/min (A) (by C-G formula based on SCr of 1.87 mg/dL (H)). Liver Function Tests: No results for input(s): AST, ALT, ALKPHOS, BILITOT, PROT, ALBUMIN in the last 168 hours. No results for input(s): LIPASE, AMYLASE in the last 168 hours. No results for input(s): AMMONIA in the last 168 hours. Coagulation Profile: No results for input(s): INR, PROTIME in the last 168 hours. Cardiac Enzymes: No results for input(s): CKTOTAL, CKMB, CKMBINDEX, TROPONINI in the last 168 hours. BNP (last 3 results) No results for input(s): PROBNP in the last 8760 hours. HbA1C: No results for input(s): HGBA1C in the last 72 hours. CBG: Recent Labs  Lab 10/03/17 0753 10/03/17 1632 10/04/17 1209 10/04/17 2041 10/05/17 0753  GLUCAP 91 231* 161* 246* 122*   Lipid Profile: No results for input(s): CHOL, HDL, LDLCALC, TRIG, CHOLHDL, LDLDIRECT in the last 72 hours. Thyroid Function Tests: No results for input(s): TSH, T4TOTAL, FREET4, T3FREE, THYROIDAB in the last 72 hours. Anemia Panel: No results for input(s): VITAMINB12, FOLATE, FERRITIN, TIBC, IRON, RETICCTPCT in the last 72 hours. Urine analysis: No results found for: COLORURINE, APPEARANCEUR, LABSPEC, PHURINE, GLUCOSEU, HGBUR, BILIRUBINUR, KETONESUR, PROTEINUR, UROBILINOGEN, NITRITE, LEUKOCYTESUR Sepsis Labs: @LABRCNTIP (procalcitonin:4,lacticidven:4)  )No results found for this or any  previous visit (from the past 240 hour(s)).    Studies: No results found.  Scheduled Meds: . amLODipine  10 mg Oral Daily  . aspirin EC  325 mg Oral Daily  . atorvastatin  40 mg Oral QHS  . carvedilol  12.5 mg Oral BID WC  . cloNIDine  0.2 mg Transdermal Weekly  . divalproex  125 mg Oral Q12H  . famotidine  20 mg Oral QHS  . feeding supplement (GLUCERNA SHAKE)  237 mL Oral TID BM  . finasteride  5 mg Oral Daily  . gabapentin  100 mg Oral QHS  . insulin aspart  0-9 Units Subcutaneous TID WC  . insulin glargine  15 Units Subcutaneous QHS  . isosorbide mononitrate  30 mg Oral Daily  . Melatonin  3 mg Oral QHS  . mirtazapine  7.5 mg Oral QHS  . polyethylene glycol  17 g Oral Daily  . senna-docusate  1 tablet Oral BID  . tamsulosin  0.4 mg Oral Daily    Continuous Infusions:    LOS: 29 days     Alma Friendly, MD [facility]   If 7PM-7AM, please contact night-coverage [email or web address] Password [email or web address] 10/05/2017, 11:31 AM

## 2017-10-05 NOTE — Progress Notes (Signed)
   10/05/17 1336  Urine Characteristics  Bladder Scan Volume (mL) 300 mL  Intermittent/Straight Cath (mL) 30 mL

## 2017-10-06 LAB — GLUCOSE, CAPILLARY
GLUCOSE-CAPILLARY: 268 mg/dL — AB (ref 65–99)
Glucose-Capillary: 204 mg/dL — ABNORMAL HIGH (ref 65–99)

## 2017-10-06 MED ORDER — HYDRALAZINE HCL 20 MG/ML IJ SOLN
5.0000 mg | INTRAMUSCULAR | Status: DC | PRN
  Administered 2017-10-07 – 2017-10-24 (×9): 5 mg via INTRAVENOUS
  Filled 2017-10-06 (×10): qty 1

## 2017-10-06 NOTE — Progress Notes (Signed)
PROGRESS NOTE    Shawn Pearson  [id number]:[id number] DOB: 12-26-1940 DOA: 09/05/2017 PCP: Mabeline Caras, NP   Specialists:     Brief Narrative:  76 year old male HTN Diabetes mellitus type 2 Dementia Chronic chest pain-CHB s/p PPM-ICD CAD ICD Stage III rectal cancer Vascular dementia  2 admissions in the past month and a half discharge 11/3, 11/13 chest pain Readmitted 11/18With hypertensive urgency found to have stable CKD stage III dementia and noncompliance with meds Developed new onset rectal bleeding 12/15-diagnosed with adenocarcinoma 02/2017 at time of colonoscopy and previous GI bleed 03/2017 underwent sigmoidoscopy with APC ablation to the bleeding tumor +/- radiation therapy GI was consulted and felt there was nothing different we would offer. Severe pvd w bilat LE amputation  Patient care has been severely hampered by patient inability to coordinate and agree with medical care and psychiatry has been consulted and are recommending [place] psychiatry facility placement  Assessment & Plan:   Principal Problem:   Dementia Active Problems:   Hypertensive urgency   CKD stage 3 secondary to diabetes [facility])   Rectal cancer ([place])   Chest pain   Acute on chronic anemia   Noncompliance with medication regimen   Agitation   Elevated troponin   Malignant neoplasm of colon (HCC)   Rectal bleeding    Lower GI Bleed with drop in Hgb Stable Likely due to bleeding rectal Ca diagnosed in 02/2017 Pt had 2 episodes of BRBPR + clots mixed in stool on 10/03/17 Hgb 7, slowing trending down over the past month Type and screen, s/p 1U of PRBC on 10/03/17 CT abd/pelvis/chest: Non-obstructing rectal mass, no evidence of mets in the body GI consulted-not a good op candidate-still having some amounts of bleeding Daily CBC shows trend remaining in the 7 range  #Dementia,with suicidal/homicidal ideation Continuesitter one to one plussuicidal  precautions. Ondivaloprexandmirtazapine, melatonin. Waiting placement at psychiatric facility  #Hypertension Controlled Continueamlodipine, carvedilol, clonidine, isosorbide.  #Combined systolic/diastolic CHF Stable with no clinical signs of exacerbation ECHO showed LVEF 40-45%, Grade 2 DD, hypokinesis of the inf wall CXR: No acute cardiopulmonary changes  #Esophagitis Continue withranitidine  #Normocytic anemia Stable S/p 1U of PRBC on 10/03/17 Hgb baseline around 10, currently dropped to 7.7 over 1 month Monitor CBC   #Type 2 diabetes mellitus Continue basal insulin and sliding scale. Patient tolerating po well.  #Chronic kidney disease stage III Stable, avoid nephrotoxic medications.  #Stage III rectal adenocarcinoma. GI consulted for further management due to bleed Have asked Pallaitve care to weigh in regarding the same and GOC    DVT prophylaxis: scd Code Status: full Family Communication: called but anable to reach wife to discuss [place] Disposition Plan: inpatient pedning resolution   Consultants:   gi  Procedures:   none  Antimicrobials:   none    Subjective:  Awake confused alert Asking about rectum and bleeding and other issues  Objective: Vitals:   10/05/17 0440 10/05/17 1341 10/05/17 2312 10/06/17 0622  BP: (!) 177/68 (!) 150/53 (!) 169/58 (!) 182/66  Pulse: 70 70 75 72  Resp: (!) 30 (!) 22 (!) 28 19  Temp: 97.8 F (36.6 C)  98.4 F (36.9 C) 98.6 F (37 C)  TempSrc: Oral  Oral Oral  SpO2: 96% 98% 99% 100%  Weight:      Height:        Intake/Output Summary (Last 24 hours) at 10/06/2017 0817 Last data filed at 10/06/2017 0611 Gross per 24 hour  Intake 717 ml  Output 1086 ml  Net -  369 ml   Filed Weights   09/20/17 1056 09/21/17 0700 09/22/17 0445  Weight: 94.8 kg (208 lb 14.4 oz) 96 kg (211 lb 10.3 oz) 95.6 kg (210 lb 12.2 oz)    Examination:  Alert obese but not oriented Thinks 1980 bno ict no pallor cta  b abd soft nt nd  Noted blood in sheets 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 Liver Function Tests: No results for input(s): AST, ALT, ALKPHOS, BILITOT, PROT, ALBUMIN in the last 168 hours. No results for input(s): LIPASE, AMYLASE in the last 168 hours. No results for input(s): AMMONIA in the last 168 hours. Coagulation Profile: No results for input(s): INR, PROTIME in the last 168 hours. Cardiac Enzymes: No results for input(s): CKTOTAL, CKMB, CKMBINDEX, TROPONINI in the last 168 hours. BNP (last 3 results) No results for input(s): PROBNP in the last 8760 hours. HbA1C: No results for input(s): HGBA1C in the last 72 hours. CBG: Recent Labs  Lab 10/04/17 2041 10/05/17 0753 10/05/17 1150 10/05/17 1655 10/05/17 2316  GLUCAP 246* 122* 181* 226* 276*   Lipid Profile: No results for input(s): CHOL, HDL, LDLCALC, TRIG, CHOLHDL, LDLDIRECT in the last 72 hours. Thyroid Function Tests: No results for input(s): TSH, T4TOTAL, FREET4, T3FREE, THYROIDAB in the last 72 hours. Anemia Panel: No results for input(s): VITAMINB12, FOLATE, FERRITIN, TIBC, IRON, RETICCTPCT in the last 72 hours. Urine analysis: No results found for: COLORURINE, APPEARANCEUR, LABSPEC, [place], GLUCOSEU, [place], [place], [place], [place], [place], NITRITE, [place]   Radiology Studies: Reviewed images personally in health database     Scheduled Meds: . amLODipine  10 mg Oral Daily  . aspirin EC  325 mg Oral Daily  . atorvastatin  40 mg Oral QHS  . carvedilol  12.5 mg Oral BID WC  . cloNIDine  0.2 mg Transdermal Weekly  . divalproex  125 mg Oral Q12H  . famotidine  20 mg Oral QHS  . feeding supplement (GLUCERNA SHAKE)  237 mL Oral TID BM  . finasteride  5 mg Oral Daily  . gabapentin  100 mg Oral QHS  . insulin aspart  0-9 Units Subcutaneous TID WC  . insulin glargine  15 Units Subcutaneous QHS  . isosorbide mononitrate  30 mg Oral Daily  . Melatonin  3 mg Oral QHS  . mirtazapine  7.5 mg Oral QHS  . polyethylene glycol  17 g Oral Daily  . senna-docusate  1 tablet Oral BID  . tamsulosin  0.4 mg Oral Daily   Continuous Infusions:   LOS: 30 days    Time spent: [place], MD [facility] ([facility]   If 7PM-7AM, please contact night-coverage [email or web address] Password [email or web address] 10/06/2017, 8:17 AM
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
## 2017-10-06 NOTE — Progress Notes (Signed)
Incontinent of urine, saturated towel. In and out cath not performed. Condom cath placed.

## 2017-10-06 NOTE — Progress Notes (Signed)
Pt BP 184/77, pt asymptomatic. Baltazar Najjar, NP notified.

## 2017-10-06 NOTE — Progress Notes (Signed)
Patient refused morning CBG check.  Also refused all morning medications.  Patient educated on importance of each medication.  Will continue to monitor.

## 2017-10-06 NOTE — Progress Notes (Signed)
Pt C/O lower abdominal pain and feeling bladder fullness. Bladder scan only showing 56 ml.  MD notified. Orders to in and out cath.

## 2017-10-07 DIAGNOSIS — Z66 Do not resuscitate: Secondary | ICD-10-CM

## 2017-10-07 LAB — TYPE AND SCREEN
ABO/RH(D): O POS
Antibody Screen: NEGATIVE
Unit division: 0
Unit division: 0

## 2017-10-07 LAB — CBC
HCT: 23.5 % — ABNORMAL LOW (ref 39.0–52.0)
Hemoglobin: 7.6 g/dL — ABNORMAL LOW (ref 13.0–17.0)
MCH: 25.9 pg — ABNORMAL LOW (ref 26.0–34.0)
MCHC: 32.3 g/dL (ref 30.0–36.0)
MCV: 80.2 fL (ref 78.0–100.0)
PLATELETS: 327 10*3/uL (ref 150–400)
RBC: 2.93 MIL/uL — AB (ref 4.22–5.81)
RDW: 15.7 % — AB (ref 11.5–15.5)
WBC: 9.4 10*3/uL (ref 4.0–10.5)

## 2017-10-07 LAB — BPAM RBC
BLOOD PRODUCT EXPIRATION DATE: 201901042359
Blood Product Expiration Date: 201901012359
ISSUE DATE / TIME: 201812151601
UNIT TYPE AND RH: 5100
UNIT TYPE AND RH: 5100

## 2017-10-07 LAB — GLUCOSE, CAPILLARY
GLUCOSE-CAPILLARY: 181 mg/dL — AB (ref 65–99)
GLUCOSE-CAPILLARY: 211 mg/dL — AB (ref 65–99)
GLUCOSE-CAPILLARY: 173 mg/dL — AB (ref 65–99)

## 2017-10-07 MED ORDER — HYDROCORTISONE 2.5 % RE CREA
TOPICAL_CREAM | Freq: Three times a day (TID) | RECTAL | Status: DC
  Filled 2017-10-07: qty 28.35

## 2017-10-07 MED ORDER — HYDROCORTISONE 2.5 % RE CREA
TOPICAL_CREAM | Freq: Two times a day (BID) | RECTAL | Status: DC
Start: 2017-10-07 — End: 2017-10-10
  Administered 2017-10-07: 1 via RECTAL
  Administered 2017-10-07 – 2017-10-10 (×5): via RECTAL
  Filled 2017-10-07: qty 28.35

## 2017-10-07 NOTE — Progress Notes (Addendum)
PROGRESS NOTE    Shawn Pearson  WNU:[id number] DOB: 23-Apr-1941 DOA: 09/05/2017 PCP: Mabeline Caras, NP   Specialists:     Brief Narrative:  76 year old male HTN Diabetes mellitus type 2 Dementia Chronic chest pain-CHB s/p PPM-ICD CAD ICD Stage III rectal cancer Vascular dementia  2 admissions in the past month and a half discharge 11/3, 11/13 chest pain Readmitted 11/18With hypertensive urgency found to have stable CKD stage III dementia and noncompliance with meds Developed new onset rectal bleeding 12/15-diagnosed with adenocarcinoma 02/2017 at time of colonoscopy and previous GI bleed 03/2017 underwent sigmoidoscopy with APC ablation to the bleeding tumor +/- radiation therapy GI was consulted and felt there was nothing different we would offer. Severe pvd w bilat LE amputation  Patient care has been severely hampered by patient inability to coordinate and agree with medical care and psychiatry has been consulted and are recommending [place] psychiatry facility placement  Assessment & Plan:   Principal Problem:   Dementia Active Problems:   Hypertensive urgency   CKD stage 3 secondary to diabetes [facility])   Rectal cancer ([place])   Chest pain   Acute on chronic anemia   Noncompliance with medication regimen   Agitation   Elevated troponin   Malignant neoplasm of colon (HCC)   Rectal bleeding    Lower GI Bleed with drop in Hgb Stable Likely due to bleeding rectal Ca diagnosed in 02/2017 Pt had 2 episodes of BRBPR + clots mixed in stool on 10/03/17 Hgb 7, slowing trending down over the past month Adding Ansusol cream bid CT abd/pelvis/chest: Non-obstructing rectal mass, no evidence of mets in the body GI consulted-not a good op candidate-still having some amounts of bleeding  #Dementia,with suicidal/homicidal ideation Continuesitter one to one plussuicidal precautions. Ondivaloprexandmirtazapine, melatonin. Waiting placement at psychiatric facility Asking  for Palliative medicine input regarding overall GOC  #Hypertension Controlled Continue amlodipine 10, carvedilol 12.5 bd, clonidine changed to patch 12/18, isosorbide 20 od  #Combined systolic/diastolic CHF Stable with no clinical signs of exacerbation ECHO showed LVEF 40-45%, Grade 2 DD, hypokinesis of the inf wall CXR: No acute cardiopulmonary changes No need diuretic  #Esophagitis Continue withranitidine  #Normocytic anemia Stable S/p 1U of PRBC on 10/03/17, 2 U prbc 12/18 Hgb baseline around 10, currently dropped to 7.7 over 1 month  Type and screen, s/p 1U of PRBC on 10/03/17--rec'd 2 PRBC 12/18  #Type 2 diabetes mellitus Continue basal insulin and sliding scale. Patient tolerating po well.  #Chronic kidney disease stage III Stable, avoid nephrotoxic medications.  #Stage III rectal adenocarcinoma. GI consulted for further management due to bleed Have asked Pallaitve care to weigh in regarding the same and GOC    DVT prophylaxis: scd Code Status: full Family Communication: called but unable to reach wife to discuss [place] Disposition Plan: inpatient pedning resolution   Consultants:   gi  Procedures:   none  Antimicrobials:   none    Subjective:  Remains confused-either refusing to answer or doesn't wish to answer orienting q's  "I don't know" is main answer to multiple q's Complaining of rectal pains  Objective: Vitals:   10/06/17 0622 10/06/17 2331 10/07/17 0105 10/07/17 0638  BP: (!) 182/66 (!) 184/77 (!) 166/66 (!) 183/68  Pulse: 72 73 74 72  Resp: 19 14  20   Temp: 98.6 F (37 C)   98.3 F (36.8 C)  TempSrc: Oral   Axillary  SpO2: 100% 96%  97%  Weight:      Height:  Intake/Output Summary (Last 24 hours) at 10/07/2017 1326 Last data filed at 10/07/2017 1300 Gross per 24 hour  Intake 1137 ml  Output -  Net 1137 ml   Filed Weights   09/20/17 1056 09/21/17 0700 09/22/17 0445  Weight: 94.8 kg (208 lb 14.4 oz) 96 kg  (211 lb 10.3 oz) 95.6 kg (210 lb 12.2 oz)    Examination:  Alert not oriented eomi ncat abd soft nt nd  Rectum not examined today 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  Data Reviewed: I have personally reviewed following labs and imaging studies  CBC: Recent Labs  Lab 10/01/17 0622 10/03/17 0611 10/03/17 2228 10/05/17 0205 10/07/17 0746  WBC 8.0 9.3  --   --  9.4  NEUTROABS 5.4  --   --   --   --   HGB 7.7* 7.0* 7.7* 7.8* 7.6*  HCT 24.8* 22.2* 23.6* 24.4* 23.5*  MCV 80.8 80.4  --   --  80.2  PLT 299 334  --   --  732   Basic Metabolic Panel: Recent Labs  Lab 10/01/17 0622  NA 134*  K 5.0  CL 100*  CO2 23  GLUCOSE 306*  BUN 50*  CREATININE 1.87*  CALCIUM 8.4*   GFR: Estimated Creatinine Clearance: 38.5 mL/min (A) (by C-G formula based on SCr of 1.87 mg/dL (H)). Liver Function Tests: No results for input(s): AST, ALT, ALKPHOS, BILITOT, PROT, ALBUMIN in the last 168 hours. No results for input(s): LIPASE, AMYLASE in the last 168 hours. No results for input(s): AMMONIA in the last 168 hours. Coagulation Profile: No results for input(s): INR, PROTIME in the last 168 hours. Cardiac Enzymes: No results for input(s): CKTOTAL, CKMB, CKMBINDEX, TROPONINI in the last 168 hours. BNP (last 3 results) No results for input(s): PROBNP in the last 8760 hours. HbA1C: No results for input(s): HGBA1C in the last 72 hours. CBG: Recent Labs  Lab 10/05/17 2316 10/06/17 1310 10/06/17 2317 10/07/17 0812 10/07/17 1305  GLUCAP 276* 204* 268* 181* 211*   Lipid Profile: No results for input(s): CHOL, HDL, LDLCALC, TRIG, CHOLHDL, LDLDIRECT in the last 72 hours. Thyroid Function Tests: No results for input(s): TSH, T4TOTAL, FREET4, T3FREE, THYROIDAB in the last 72 hours. Anemia Panel: No results for input(s): VITAMINB12, FOLATE, FERRITIN, TIBC, IRON, RETICCTPCT in the last 72 hours. Urine analysis: No results found for: COLORURINE, APPEARANCEUR, LABSPEC, [place], GLUCOSEU, [place], [place],  [place], [place], [place], NITRITE, [place]   Radiology Studies: Reviewed images personally in health database    Scheduled Meds: . amLODipine  10 mg Oral Daily  . aspirin EC  325 mg Oral Daily  . atorvastatin  40 mg Oral QHS  . carvedilol  12.5 mg Oral BID WC  . cloNIDine  0.2 mg Transdermal Weekly  . divalproex  125 mg Oral Q12H  . famotidine  20 mg Oral QHS  . feeding supplement (GLUCERNA SHAKE)  237 mL Oral TID BM  . finasteride  5 mg Oral Daily  . gabapentin  100 mg Oral QHS  . hydrocortisone   Rectal BID  . insulin aspart  0-9 Units Subcutaneous TID WC  . insulin glargine  15 Units Subcutaneous QHS  . isosorbide mononitrate  30 mg Oral Daily  . Melatonin  3 mg Oral QHS  . mirtazapine  7.5 mg Oral QHS  . polyethylene glycol  17 g Oral Daily  . senna-docusate  1 tablet Oral BID  . tamsulosin  0.4 mg Oral Daily   Continuous Infusions:   LOS: 31  days    Time spent: [place], MD [facility] [phone]   If 7PM-7AM, please contact night-coverage [email or web address] Password [email or web address] 10/07/2017, 1:26 PM

## 2017-10-07 NOTE — Progress Notes (Signed)
Patient refused noon CBG check.  Educated on importance of monitoring CBG's and receiving insulin.  Patient became aggressive, nearly pushing over his lunch tray and said "Go f*ck yourself".  Will attempt again later.
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
## 2017-10-07 NOTE — Consult Note (Signed)
Consultation Note Date: 10/07/2017   Patient Name: Shawn Pearson  DOB: 1940/10/27  MRN: [id number]  Age / Sex: 76 y.o., male  PCP: Mabeline Caras, NP Referring Physician: Nita Sells, MD  Reason for Consultation: Establishing goals of care  HPI/Patient Profile: 76 y.o. male   admitted on 09/05/2017  past medical history significant for dementia, CKD, DM,  HTN , rectal CA diagnosed in 02/2017,  medical noncompliance wth meds, neg stress test in 2/18.   Per medical records, patient was admitted from SNF with chest pain, uncontrolled blood pressure due to non compliance with medication and headache. He says he does not like the nursing home he is at and that is why he refuses to take his meds. Reportedly the patient has been non co operative with the Nursing staff, and still refusing his medication.    Apparently he has  had multiple placements 2/2 to his inability to agee and cooperate with medical care and psychiatry has now recommended Geri-Psych placement.  Patient continues to be inappropriate with staff and uncooperative with treatment plan.  Discussed with SW that  hospital staff has been unable to contact family for input in coordination of care plan.  Clearly it is important to establish clear GOC for this patient.   Clinical Assessment and Goals of Care:   This NP Wadie Lessen reviewed medical records, received report from team, assessed the patient and was able to  speak with  Wife by telephone;  I then  spoke with a grand-daughter who was with wife at time of phone call,  regarding  diagnosis, prognosis, GOC, EOL wishes disposition and options.  Grand-daughter is Shawn Pearson # [phone]  Family speaks to complicated, ongoing "issues" with care delivery systems.  They have been unhappy with the facilities the patient has been placed over the last year.    They also speak to the  patient inappropriate behavior toward family when they visit and its impact in family involvement.   A detailed discussion was had today regarding advanced directives.  Concepts specific to code status,  was had.    Both wife and grand-daughter adamantly reinforce and clarify the fact that the decision for DNR was made previously; they don't understand why that is not been documented.  I assured them I would document today.  The difference between a aggressive medical intervention path  and a palliative comfort care path for this patient at this time was had.  Values and goals of care important to patient and family were attempted to be elicited.  I strongly encouraged that family make every effort to meet with PMT to clarify plan of care for this man.  I shared the plan  at this time is for placement for psychiatric conditions.  Discussed with family the importance of continued conversation with family and their  medical providers regarding overall plan of care and treatment options,  ensuring decisions are within the context of the patients values and GOCs.  Grand-daughter verbalizes understanding of today's conversation.  Questions and concerns  addressed.    Grand-daughter tells me she will get in touch with other family members and call me today with a time that they can meet and to schedule a meeting.  I await callback.   Family encouraged to call with questions or concerns.    PMT will continue to support holistically.    NEXT OF KIN- no documented HPOA    SUMMARY OF RECOMMENDATIONS    Code Status/Advance Care Planning:  DNR-documented today  Additional Recommendations (Limitations, Scope, Preferences):  Continue with current medical interventions and plan  Move forward with disposition plan until family makes documented changes  Await callback from family      Primary Diagnoses: Present on Admission: . Chest pain . CKD stage 3 secondary to diabetes ([place]) .  Hypertensive urgency . Dementia   I have reviewed the medical record, interviewed the patient and family, and examined the patient. The following aspects are pertinent.  Past Medical History:  Diagnosis Date  . Aortic atherosclerosis ([place])   . Dementia   . Diabetes mellitus without complication ([place])   . History of angina   . Hypertension   . Renal disorder    CKD stage 3   Social History   Socioeconomic History  . Marital status: Widowed    Spouse name: None  . Number of children: None  . Years of education: None  . Highest education level: None  Social Needs  . Financial resource strain: None  . Food insecurity - worry: None  . Food insecurity - inability: None  . Transportation needs - medical: None  . Transportation needs - non-medical: None  Occupational History  . None  Tobacco Use  . Smoking status: Never Smoker  . Smokeless tobacco: Never Used  Substance and Sexual Activity  . Alcohol use: No  . Drug use: No  . Sexual activity: No  Other Topics Concern  . None  Social History Narrative  . None   History reviewed. No pertinent family history. Scheduled Meds: . amLODipine  10 mg Oral Daily  . aspirin EC  325 mg Oral Daily  . atorvastatin  40 mg Oral QHS  . carvedilol  12.5 mg Oral BID WC  . cloNIDine  0.2 mg Transdermal Weekly  . divalproex  125 mg Oral Q12H  . famotidine  20 mg Oral QHS  . feeding supplement (GLUCERNA SHAKE)  237 mL Oral TID BM  . finasteride  5 mg Oral Daily  . gabapentin  100 mg Oral QHS  . hydrocortisone   Rectal BID  . insulin aspart  0-9 Units Subcutaneous TID WC  . insulin glargine  15 Units Subcutaneous QHS  . isosorbide mononitrate  30 mg Oral Daily  . Melatonin  3 mg Oral QHS  . mirtazapine  7.5 mg Oral QHS  . polyethylene glycol  17 g Oral Daily  . senna-docusate  1 tablet Oral BID  . tamsulosin  0.4 mg Oral Daily   Continuous Infusions: PRN Meds:.acetaminophen, hydrALAZINE, ipratropium-albuterol, polyvinyl alcohol,  simethicone Medications Prior to Admission:  Prior to Admission medications   Medication Sig Start Date End Date Taking? Authorizing Provider  amLODipine (NORVASC) 10 MG tablet Take 10 mg by mouth daily.   Yes [provider]  atorvastatin (LIPITOR) 40 MG tablet Take 40 mg by mouth at bedtime. 03/30/17 03/30/18 Yes [provider]  finasteride (PROSCAR) 5 MG tablet Take 5 mg by mouth daily.   Yes [provider]  furosemide (LASIX) 40 MG tablet Take 60 mg  by mouth daily. 08/13/17  Yes [provider]  haloperidol lactate (HALDOL) 5 MG/ML injection Inject 2 mg every 6 (six) hours as needed into the muscle.   Yes [provider]  Insulin Glargine (BASAGLAR KWIKPEN) 100 UNIT/ML SOPN Inject 6 Units into the skin at bedtime. 03/30/17  Yes [provider]  losartan (COZAAR) 50 MG tablet Take 50 mg by mouth daily.   Yes [provider]  nitroGLYCERIN (NITROSTAT) 0.4 MG SL tablet Place 0.4 mg as needed under the tongue for chest pain.  08/13/17  Yes [provider]  polyethylene glycol powder (GLYCOLAX/MIRALAX) powder Take 17 g by mouth daily. 08/13/17  Yes Forde Dandy, MD  tamsulosin (FLOMAX) 0.4 MG CAPS capsule Take 0.4 mg daily by mouth.  08/13/17  Yes [provider]  carvedilol (COREG) 12.5 MG tablet Take 1 tablet (12.5 mg total) 2 (two) times daily with a meal by mouth. Patient not taking: Reported on 09/05/2017 09/01/17   Raiford Noble Latif, DO  feeding supplement, GLUCERNA SHAKE, (GLUCERNA SHAKE) LIQD Take 237 mLs 2 (two) times daily between meals by mouth. Patient not taking: Reported on 09/05/2017 09/02/17   Raiford Noble Latif, DO   No Known Allergies Review of Systems  Unable to perform ROS   Physical Exam  Constitutional: He appears well-developed.  -currently patient appears to be resting comfortable with sitter at bedside  He is not engaged to responsive to my verbal stimuli  Cardiovascular: Normal rate  and regular rhythm.  Pulmonary/Chest: Effort normal.  Skin: Skin is warm and dry.    Vital Signs: BP (!) 183/68 (BP Location: Left Arm)   Pulse 72   Temp 98.3 F (36.8 C) (Axillary)   Resp 20   Ht 6\' 2"  (1.88 m)   Wt 95.6 kg (210 lb 12.2 oz)   SpO2 97%   BMI 27.06 kg/m  Pain Assessment: 0-10 POSS *See Group Information*: S-Acceptable,Sleep, easy to arouse Pain Score: Asleep   SpO2: SpO2: 97 % O2 Device:SpO2: 97 % O2 Flow Rate: .   IO: Intake/output summary:   Intake/Output Summary (Last 24 hours) at 10/07/2017 1234 Last data filed at 10/07/2017 0900 Gross per 24 hour  Intake 1137 ml  Output --  Net 1137 ml    LBM: Last BM Date: 10/06/17 Baseline Weight: Weight: 96.2 kg (212 lb) Most recent weight: Weight: 95.6 kg (210 lb 12.2 oz)     Palliative Assessment/Data: 30% at best   Discussed with Nadia/SW and bedside RN  Time In: 1200 Time Out: 1300 Time Total: 60 minutes Greater than 50%  of this time was spent counseling and coordinating care related to the above assessment and plan.  Signed by: Wadie Lessen, NP   Please contact Palliative Medicine Team phone at [phone] for questions and concerns.  For individual provider: See Shea Evans

## 2017-10-08 MED ORDER — TRAMADOL HCL 50 MG PO TABS
50.0000 mg | ORAL_TABLET | Freq: Two times a day (BID) | ORAL | Status: DC | PRN
  Administered 2017-10-09 – 2017-10-24 (×19): 50 mg via ORAL
  Filled 2017-10-08 (×22): qty 1

## 2017-10-08 NOTE — Progress Notes (Signed)
Pt refused all PO meds today. Tried multiple varying times to get Pt to take them but refused saying they would do no good. Pt refused CBG checks and VS. Pt was offered pain meds to help with rectal pain but would only accept hydrocortisone cream. Sitter at bedside. Will continue to monitor.

## 2017-10-08 NOTE — Progress Notes (Addendum)
PROGRESS NOTE    Shawn Pearson  UKG:[id number] DOB: 1941-10-04 DOA: 09/05/2017 PCP: [name], NP   Specialists:     Brief Narrative:  76 year old male HTN Diabetes mellitus type 2 Dementia Chronic chest pain-CHB s/p PPM-ICD CAD ICD Stage III rectal cancer Vascular dementia  2 admissions in the past month and a half discharge 11/3, 11/13 chest pain Readmitted 11/18With hypertensive urgency found to have stable CKD stage III dementia and noncompliance with meds Developed new onset rectal bleeding 12/15-diagnosed with adenocarcinoma 02/2017 at time of colonoscopy and previous GI bleed 03/2017 underwent sigmoidoscopy with APC ablation to the bleeding tumor +/- radiation therapy GI was consulted and felt there was nothing different we would offer. Severe pvd w bilat LE amputation  care  hampered by patient inability to coordinate and agree with medical care and psychiatry has been consulted and are recommending [facility] [place] psychiat[facility] facility placement  Assessment & Plan:   Principal Problem:   Dementia Active Problems:   Hypertensive urgency   CKD stage 3 secondary to diabetes [facility])   Rectal cancer ([place])   Chest pain   Acute on chronic anemia   Noncompliance with medication regimen   Agitation   Elevated troponin   Malignant neoplasm of colon (HCC)   Rectal bleeding    Lower GI Bleed with drop in Hgb Stable Likely due to bleeding rectal Ca diagnosed in 02/2017 Pt had 2 episodes of BRBPR + clots mixed in stool on 10/03/17 Hgb 7, slowing trending down over the past month Adding Ansusol cream bid, adding tramadol 50  As well 12/20 for pains CT abd/pelvis/chest: Non-obstructing rectal mass, no evidence of mets in the body GI consulted-not a good op candidate-hemoglobin stable in 7 range.  Rpt labs in am  #Dementia,with suicidal/homicidal ideation Continuesitter one to one plussuicidal precautions. Ondivaloprexandmirtazapine, melatonin. Waiting placement at  psychiatric facility Still waiting for Palliative medicine input regarding overall GOC  #Hypertension Controlled Continue amlodipine 10, carvedilol 12.5 bd, clonidine changed to patch 12/18, isosorbide 20 od  #Combined systolic/diastolic CHF Stable with no clinical signs of exacerbation ECHO showed LVEF 40-45%, Grade 2 DD, hypokinesis of the inf wall CXR: No acute cardiopulmonary changes No need diuretic  #Esophagitis Continue withranitidine  #Normocytic anemia Stable S/p 1U of PRBC on 10/03/17, 2 U prbc 12/18 Hgb baseline around 10, currently dropped to 7.7 over 1 month  Type and screen, s/p 1U of PRBC on 10/03/17--rec'd 2 PRBC  12/18 Labs to recheck 12/21  #Type 2 diabetes mellitus Continue basal insulin lantus 15 U and sliding scale.  Sugars 173-268- Patient tolerating po well-90-100% meals  #Chronic kidney disease stage III Stable, avoid nephrotoxic medications.  #Stage III rectal adenocarcinoma. GI consulted for further management due to bleed Have asked Pallaitve care to weigh in regarding the same and GOC    DVT prophylaxis: scd Code Status: full Family Communication: called but unable to reach wife to discuss [place] Disposition Plan: inpatient pedning resolution   Consultants:   gi  Procedures:   none  Antimicrobials:   none    Subjective:  C/o bottom pain Asking for something for it Refusing PO meds No cp No SOB  Objective: Vitals:   10/07/17 0105 10/07/17 0638 10/07/17 1333 10/07/17 2100  BP: (!) 166/66 (!) 183/68 (!) 144/53 (!) 158/52  Pulse: 74 72 66 70  Resp:  20 20 18   Temp:  98.3 F (36.8 C) 98.5 F (36.9 C) 98.3 F (36.8 C)  TempSrc:  Axillary Oral Oral  SpO2:  97%  93% 97%  Weight:      Height:        Intake/Output Summary (Last 24 hours) at 10/08/2017 1514 Last data filed at 10/08/2017 1300 Gross per 24 hour  Intake 480 ml  Output 350 ml  Net 130 ml   Filed Weights   09/20/17 1056 09/21/17 0700 09/22/17 0445    Weight: 94.8 kg (208 lb 14.4 oz) 96 kg (211 lb 10.3 oz) 95.6 kg (210 lb 12.2 oz)    Examination:  Alert not oriented eomi ncat abd soft nt nd  Rectum not examined 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 and on R side  Data Reviewed: I have personally reviewed following labs and imaging studies  CBC: Recent Labs  Lab 10/03/17 0611 10/03/17 2228 10/05/17 0205 10/07/17 0746  WBC 9.3  --   --  9.4  HGB 7.0* 7.7* 7.8* 7.6*  HCT 22.2* 23.6* 24.4* 23.5*  MCV 80.4  --   --  80.2  PLT 334  --   --  716   Basic Metabolic Panel: No results for input(s): NA, K, CL, CO2, GLUCOSE, BUN, CREATININE, CALCIUM, MG, PHOS in the last 168 hours. GFR: Estimated Creatinine Clearance: 38.5 mL/min (A) (by C-G formula based on SCr of 1.87 mg/dL (H)). Liver Function Tests: No results for input(s): AST, ALT, ALKPHOS, BILITOT, PROT, ALBUMIN in the last 168 hours. No results for input(s): LIPASE, AMYLASE in the last 168 hours. No results for input(s): AMMONIA in the last 168 hours. Coagulation Profile: No results for input(s): INR, PROTIME in the last 168 hours. Cardiac Enzymes: No results for input(s): CKTOTAL, CKMB, CKMBINDEX, TROPONINI in the last 168 hours. BNP (last 3 results) No results for input(s): PROBNP in the last 8760 hours. HbA1C: No results for input(s): HGBA1C in the last 72 hours. CBG: Recent Labs  Lab 10/06/17 1310 10/06/17 2317 10/07/17 0812 10/07/17 1305 10/07/17 2125  GLUCAP 204* 268* 181* 211* 173*   Lipid Profile: No results for input(s): CHOL, HDL, LDLCALC, TRIG, CHOLHDL, LDLDIRECT in the last 72 hours. Thyroid Function Tests: No results for input(s): TSH, T4TOTAL, FREET4, T3FREE, THYROIDAB in the last 72 hours. Anemia Panel: No results for input(s): VITAMINB12, FOLATE, FERRITIN, TIBC, IRON, RETICCTPCT in the last 72 hours. Urine analysis: No results found for: COLORURINE, APPEARANCEUR, LABSPEC, [place], GLUCOSEU, [place], [place], [place], [place], [place], NITRITE,  [place]   Radiology Studies: Reviewed images personally in health database    Scheduled Meds: . amLODipine  10 mg Oral Daily  . aspirin EC  325 mg Oral Daily  . atorvastatin  40 mg Oral QHS  . carvedilol  12.5 mg Oral BID WC  . cloNIDine  0.2 mg Transdermal Weekly  . divalproex  125 mg Oral Q12H  . famotidine  20 mg Oral QHS  . feeding supplement (GLUCERNA SHAKE)  237 mL Oral TID BM  . finasteride  5 mg Oral Daily  . gabapentin  100 mg Oral QHS  . hydrocortisone   Rectal BID  . insulin aspart  0-9 Units Subcutaneous TID WC  . insulin glargine  15 Units Subcutaneous QHS  . isosorbide mononitrate  30 mg Oral Daily  . Melatonin  3 mg Oral QHS  . mirtazapine  7.5 mg Oral QHS  . polyethylene glycol  17 g Oral Daily  . senna-docusate  1 tablet Oral BID  . tamsulosin  0.4 mg Oral Daily   Continuous Infusions:   LOS: 32 days    Time spent: 15    Verneita Griffes, MD  [facility] (P) [phone]   If 7PM-7AM, please contact night-coverage [email or web address] Password [facility] 10/08/2017, 3:14 PM

## 2017-10-08 NOTE — Progress Notes (Signed)
Shawn Pearson called to confirm that patient is still on the waiting list.   [name] [facility] [phone]

## 2017-10-08 NOTE — Progress Notes (Signed)
Pt refused VS to be taken this morning.

## 2017-10-08 NOTE — Progress Notes (Signed)
Nutrition Follow-up  DOCUMENTATION CODES:   Obesity unspecified  INTERVENTION:   -Continue Glucerna Shake po TID, each supplement provides 220 kcal and 10 grams of protein  NUTRITION DIAGNOSIS:   Inadequate oral intake related to lethargy/confusion as evidenced by meal completion < 25%.  Progressing  GOAL:   Patient will meet greater than or equal to 90% of their needs  Progressing  MONITOR:   PO intake, Supplement acceptance, Labs, Weight trends, Skin, I & O's  REASON FOR ASSESSMENT:   Malnutrition Screening Tool    ASSESSMENT:   Patient is a 76 year old male with past medical history significant for dementia, CKD, HTN , medical noncompliance wth meds, neg stress test in 2/18. Patient was admitted from SNF with chest pain, uncontrolled blood pressure due to non compliance with medication and headache.  He says he does not like the nursing home he is at and that is why he refuses to take his meds. The patient has been non co operative with the Nursing staff, and still refusing his medication. Psych has been consulted as patient likely has dementia with behavioral problems.   12/1- remeron initiated by psychiatry in attempt to stimulate appetite and decrease agitation 12/3- Ensure supplement changed to Glucerna by nursing staff due to increased CBGS  Reviewed GI note from 10/05/17; pt with small dark stool. Pt with minimal options from GI perspective. Palliative care consult has been placed for goals of care.   Pt continues to be verbally abusive to staff and often refuses care. Meal completion 0-50%. Pt will consume supplements, however, will only consume strawberry flavor. No new weight since 09/22/17.   Per CSW note, pt remains on priority waiting list for [place].  Labs reviewed: CBGS: 0-9 units insulin aspart TID with meals, 15 units insulin glargine daily).   Diet Order:  Diet Carb Modified Fluid consistency: Thin; Room service appropriate? Yes  EDUCATION NEEDS:    Not appropriate for education at this time  Skin:  Skin Assessment: Reviewed RN Assessment  Last BM:  10/07/17  Height:   Ht Readings from Last 1 Encounters:  09/06/17 6\' 2"  (1.88 m)    Weight:   Wt Readings from Last 1 Encounters:  09/22/17 210 lb 12.2 oz (95.6 kg)    Ideal Body Weight:  75.1 kg  BMI:  Body mass index is 27.06 kg/m.  Estimated Nutritional Needs:   Kcal:  1650-1850  Protein:  80-95 grams  Fluid:  1.6-1.8 L    Evah Rashid A. Jimmye Norman, RD, LDN, CDE Pager: [phone] After hours Pager: [phone]

## 2017-10-09 DIAGNOSIS — Z66 Do not resuscitate: Secondary | ICD-10-CM

## 2017-10-09 DIAGNOSIS — Z515 Encounter for palliative care: Secondary | ICD-10-CM

## 2017-10-09 LAB — GLUCOSE, CAPILLARY
GLUCOSE-CAPILLARY: 144 mg/dL — AB (ref 65–99)
Glucose-Capillary: 168 mg/dL — ABNORMAL HIGH (ref 65–99)

## 2017-10-09 NOTE — Progress Notes (Signed)
Refusing am labs.

## 2017-10-09 NOTE — Progress Notes (Signed)
Refused all meds.  C/O pressure in lower abdomen and feeling the need to urinate.  In and out cath done per PRN order with 100 ml output.  Multiple loose dark stools this shift. Sitter at bedside.

## 2017-10-09 NOTE — Care Management Important Message (Signed)
Important Message  Patient Details  Name: Shawn Pearson MRN: [id number] Date of Birth: 27-Dec-1940   Medicare Important Message Given:  Yes    [name] [place] 10/09/2017, 10:08 AM

## 2017-10-09 NOTE — Progress Notes (Signed)
PROGRESS NOTE    Shawn Pearson  RKY:[id number] DOB: September 09, 1941 DOA: 09/05/2017 PCP: [name], NP   Specialists:     Brief Narrative:  76 aam HTN Diabetes mellitus type 2 Dementia Chronic chest pain-CHB s/p PPM-ICD CAD ICD Stage III rectal cancer Vascular dementia  2 admissions in the past month and a half discharge 11/3, 11/13 chest pain Readmitted 11/18With hypertensive urgency found to have stable CKD stage III dementia and noncompliance with meds Developed new onset rectal bleeding 12/15-diagnosed with adenocarcinoma 02/2017 at time of colonoscopy and previous GI bleed 03/2017 underwent sigmoidoscopy with APC ablation to the bleeding tumor +/- radiation therapy GI was consulted and felt there was nothing different we would offer. Severe pvd w bilat LE amputation  care hampered by patient inability to coordinate and agree with medical care and psychiatry has been consulted and are recommending [place] psychiatry facility placement  Assessment & Plan:   Principal Problem:   Dementia Active Problems:   Hypertensive urgency   CKD stage 3 secondary to diabetes [facility])   Rectal cancer ([place])   Chest pain   Acute on chronic anemia   Noncompliance with medication regimen   Agitation   Elevated troponin   Malignant neoplasm of colon [facility])   Rectal bleeding   Palliative care by specialist   DNR (do not resuscitate)    Lower GI Bleed with drop in Hgb Stable Likely due to bleeding rectal Ca diagnosed in 02/2017 Pt had 2 episodes of BRBPR + clots mixed in stool on 10/03/17 Hgb 7, slowing trending down over the past month anusol not helping d/c'd, adding tramadol 50 12/20 for pains CT abd/pelvis/chest: Non-obstructing rectal mass, no evidence of mets in the body GI consulted-not a good op candidate-hemoglobin stable in 7 range--patient refusing labs, care multiple occasions stopping asa 325  #Dementia,with suicidal/homicidal ideation Continuesitter one to one plussuicidal  precautions. Ondivaloprexandmirtazapine, melatonin. Waiting placement at psychiatric facility Poor prognosis re: GOC-DNR  #Hypertension Controlled Continue amlodipine 10, carvedilol 12.5 bd, clonidine changed to patch 12/18, isosorbide 20 od f willing to take  #Combined systolic/diastolic CHF Stable with no clinical signs of exacerbation ECHO showed LVEF 40-45%, Grade 2 DD, hypokinesis of the inf wall CXR: No acute cardiopulmonary changes No need diuretic  #Esophagitis Continue withranitidine  #Normocytic anemia Stable S/p 1U of PRBC on 10/03/17, 2 U prbc 12/18 Hgb baseline around 10, currently dropped to 7.7 over 1 month  Type and screen, s/p 1U of PRBC on 10/03/17--rec'd 2 PRBC in dilaysis 12/18--refusing care  #Type 2 diabetes mellitus Continue basal insulin lantus 15 U and sliding scale.  Sugars 121-169- Patient tolerating po well-90-100% meals  #Chronic kidney disease stage III Stable, avoid nephrotoxic medications.  #Stage III rectal adenocarcinoma. GI consulted for further management due to bleed Have asked Pallaitve care to weigh in regarding the same and GOC    DVT prophylaxis: scd Code Status: full Family Communication: called but unable to reach wife to discuss [place] Disposition Plan: inpatient pedning resolution   Consultants:   gi  Procedures:   none  Antimicrobials:   none    Subjective:  C/o bottom pain overall about the same Refusing care-refusing labs-refusing meds  Objective: Vitals:   10/07/17 0105 10/07/17 0638 10/07/17 1333 10/07/17 2100  BP: (!) 166/66 (!) 183/68 (!) 144/53 (!) 158/52  Pulse: 74 72 66 70  Resp:  20 20 18   Temp:  98.3 F (36.8 C) 98.5 F (36.9 C) 98.3 F (36.8 C)  TempSrc:  Axillary Oral Oral  SpO2:  97% 93% 97%  Weight:      Height:        Intake/Output Summary (Last 24 hours) at 10/09/2017 1252 Last data filed at 10/09/2017 0145 Gross per 24 hour  Intake 477 ml  Output 100 ml  Net 377  ml   Filed Weights   09/20/17 1056 09/21/17 0700 09/22/17 0445  Weight: 94.8 kg (208 lb 14.4 oz) 96 kg (211 lb 10.3 oz) 95.6 kg (210 lb 12.2 oz)    Examination:  Alert not oriented s1 s2 no m/r/g abd soft nt nd  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 GFR: Estimated Creatinine Clearance: 38.5 mL/min (A) (by C-G formula based on SCr of 1.87 mg/dL (H)). Liver Function Tests: No results for input(s): AST, ALT, ALKPHOS, BILITOT, PROT, ALBUMIN in the last 168 hours. No results for input(s): LIPASE, AMYLASE in the last 168 hours. No results for input(s): AMMONIA in the last 168 hours. Coagulation Profile: No results for input(s): INR, PROTIME in the last 168 hours. Cardiac Enzymes: No results for input(s): CKTOTAL, CKMB, CKMBINDEX, TROPONINI in the last 168 hours. BNP (last 3 results) No results for input(s): PROBNP in the last 8760 hours. HbA1C: No results for input(s): HGBA1C in the last 72 hours. CBG: Recent Labs  Lab 10/06/17 2317 10/07/17 0812 10/07/17 1305 10/07/17 2125 10/09/17 0744  GLUCAP 268* 181* 211* 173* 144*   Lipid Profile: No results for input(s): CHOL, HDL, LDLCALC, TRIG, CHOLHDL, LDLDIRECT in the last 72 hours. Thyroid Function Tests: No results for input(s): TSH, T4TOTAL, FREET4, T3FREE, THYROIDAB in the last 72 hours. Anemia Panel: No results for input(s): VITAMINB12, FOLATE, FERRITIN, TIBC, IRON, RETICCTPCT in the last 72 hours. Urine analysis: No results found for: COLORURINE, APPEARANCEUR, LABSPEC, [place], GLUCOSEU,  [place], [place], [place], [place], [place], NITRITE, [place]   Radiology Studies: Reviewed images personally in health database    Scheduled Meds: . amLODipine  10 mg Oral Daily  . aspirin EC  325 mg Oral Daily  . atorvastatin  40 mg Oral QHS  . carvedilol  12.5 mg Oral BID WC  . cloNIDine  0.2 mg Transdermal Weekly  . divalproex  125 mg Oral Q12H  . famotidine  20 mg Oral QHS  . feeding supplement (GLUCERNA SHAKE)  237 mL Oral TID BM  . finasteride  5 mg Oral Daily  . gabapentin  100 mg Oral QHS  . hydrocortisone   Rectal BID  . insulin aspart  0-9 Units Subcutaneous TID WC  . insulin glargine  15 Units Subcutaneous QHS  . isosorbide mononitrate  30 mg Oral Daily  . Melatonin  3 mg Oral QHS  . mirtazapine  7.5 mg Oral QHS  . tamsulosin  0.4 mg Oral Daily   Continuous Infusions:   LOS: 33 days    Time spent: [place], MD [facility] ([facility]   If 7PM-7AM, please contact night-coverage [email or web address] Password  [email or web address] 10/09/2017, 12:52 PM

## 2017-10-09 NOTE — Progress Notes (Signed)
Patient ID: Shawn Pearson, male   DOB: [birth year], 76 y.o.   MRN: [id number]  This NP visited patient at the bedside as a follow up to  yesterday's [place].  Patient is awake, and easily agitated when I attempted to examine and engage with him.  Sitter at bedside.  Discussed with SW/Nadia the fact that family has not called to schedule a meeting, and that I attempted to call family today and was unable to be reached and received no callback from message left.  PMT will continue to shadow the chart for palliative needs and if family contacts PMT  will re-engage.    Questions and concerns addressed  Time in  1215         Time out  1230  Total time spent on the unit was 15 minutes  Discussed with SW/Nadia and bedside RN    Greater than 50% of the time was spent in counseling and coordination of care  Wadie Lessen NP  Palliative Medicine Team Team Phone # [phone] Pager [phone]

## 2017-10-09 NOTE — Progress Notes (Signed)
Pt refused all labs, VS and 1200, 1700 CBG checks. Will continue to monitor.

## 2017-10-09 NOTE — Progress Notes (Addendum)
PROGRESS NOTE    Shawn Pearson  BLT:[id number] DOB: 1941-02-14 DOA: 09/05/2017 PCP: [name], NP   Specialists:     Brief Narrative:  76 year old male HTN Diabetes mellitus type 2 Dementia Chronic chest pain-CHB s/p PPM-ICD CAD ICD Stage III rectal cancer Vascular dementia  2 admissions in the past month and a half discharge 11/3, 11/13 chest pain Readmitted 11/18With hypertensive urgency found to have stable CKD stage III dementia and noncompliance with meds Developed new onset rectal bleeding 12/15-diagnosed with adenocarcinoma 02/2017 at time of colonoscopy and previous GI bleed 03/2017 underwent sigmoidoscopy with APC ablation to the bleeding tumor +/- radiation therapy GI was consulted and felt there was nothing different we would offer. Severe pvd w bilat LE amputation  care  hampered by patient inability to coordinate and agree with medical care and psychiatry has been consulted and are recommending [place] psychiatry facility placement  Assessment & Plan:   Principal Problem:   Dementia Active Problems:   Hypertensive urgency   CKD stage 3 secondary to diabetes [facility])   Rectal cancer ([place])   Chest pain   Acute on chronic anemia   Noncompliance with medication regimen   Agitation   Elevated troponin   Malignant neoplasm of colon [facility])   Rectal bleeding   Palliative care by specialist   DNR (do not resuscitate)    Lower GI Bleed with drop in Hgb Stable Likely due to bleeding rectal Ca diagnosed in 02/2017 Continues to have brb Lab work not available as patient refusing, Hgb dropping 7 continuing Ansusol cream bid, adding tramadol 50  As well 12/20 for pains CT abd/pelvis/chest: Non-obstructing rectal mass, no evidence of mets in the body  #Dementia,with suicidal/homicidal ideation Continuesitter one to one plussuicidal precautions. Ondivaloprexandmirtazapine, melatonin. Waiting placement at psychiatric facility still Still waiting for Palliative  medicine input regarding overall GOC  #Hypertension Controlled when taking meds Continue amlodipine 10, carvedilol 12.5 bd, clonidine changed to patch 12/18, isosorbide 20 od if willing to take meds  #Combined systolic/diastolic CHF Stable with no clinical signs of exacerbation ECHO showed LVEF 40-45%, Grade 2 DD, hypokinesis of the inf wall CXR: No acute cardiopulmonary changes No need diuretic  #Esophagitis Continue withranitidine  #Normocytic anemia Stable S/p 1U of PRBC on 10/03/17, 2 U prbc 12/18 Hgb baseline around 10, currently dropped to 7.7 over 1 month  Type and screen, s/p 1U of PRBC on 10/03/17--rec'd 2 PRBC 12/18  #Type 2 diabetes mellitus Continue basal insulin lantus 15 U and sliding scale.  Sugars 144-173 Patient tolerating po   #Chronic kidney disease stage III Stable, avoid nephrotoxic medications.  #Stage III rectal adenocarcinoma. GI consulted for further management due to bleed-stopping laxatives and see if helps decrease bleeding from rectum    DVT prophylaxis: scd Code Status: full Family Communication: called Andria Frames granddaughter--no response Disposition Plan: inpatient pedning resolution and palcement at [facility]   Consultants:   gi  Procedures:   none  Antimicrobials:   none    Subjective:  continues to have bleeding from rectum Having pain but refusing care and refusing labs Asking if anything can be done re Rectum  Objective: Vitals:   10/07/17 0105 10/07/17 0638 10/07/17 1333 10/07/17 2100  BP: (!) 166/66 (!) 183/68 (!) 144/53 (!) 158/52  Pulse: 74 72 66 70  Resp:  20 20 18   Temp:  98.3 F (36.8 C) 98.5 F (36.9 C) 98.3 F (36.8 C)  TempSrc:  Axillary Oral Oral  SpO2:  97% 93% 97%  Weight:  Height:        Intake/Output Summary (Last 24 hours) at 10/09/2017 1411 Last data filed at 10/09/2017 0145 Gross per 24 hour  Intake 237 ml  Output 100 ml  Net 137 ml   Filed Weights   09/20/17 1056  09/21/17 0700 09/22/17 0445  Weight: 94.8 kg (208 lb 14.4 oz) 96 kg (211 lb 10.3 oz) 95.6 kg (210 lb 12.2 oz)    Examination:  Alert in pain eomi ncat abd soft nt nd  blood in sheets and chunky stool 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 GFR: Estimated Creatinine Clearance: 38.5 mL/min (A) (by C-G formula based on SCr of 1.87 mg/dL (H)). Liver Function Tests: No results for input(s): AST, ALT, ALKPHOS, BILITOT, PROT, ALBUMIN in the last 168 hours. No results for input(s): LIPASE, AMYLASE in the last 168 hours. No results for input(s): AMMONIA in the last 168 hours. Coagulation Profile: No results for input(s): INR, PROTIME in the last 168 hours. Cardiac Enzymes: No results for input(s): CKTOTAL, CKMB, CKMBINDEX, TROPONINI in the last 168 hours. BNP (last 3 results) No results for input(s): PROBNP in the last 8760 hours. HbA1C: No results for input(s): HGBA1C in the last 72 hours. CBG: Recent Labs  Lab 10/06/17 2317 10/07/17 0812 10/07/17 1305 10/07/17 2125 10/09/17 0744  GLUCAP 268* 181* 211* 173* 144*   Lipid Profile: No results for input(s): CHOL, HDL, LDLCALC, TRIG, CHOLHDL, LDLDIRECT in the last 72 hours. Thyroid Function Tests: No results for input(s): TSH, T4TOTAL, FREET4, T3FREE, THYROIDAB in the last 72 hours. Anemia Panel: No results for input(s): VITAMINB12, FOLATE, FERRITIN, TIBC, IRON, RETICCTPCT in the last 72 hours. Urine analysis: No results found for: COLORURINE, APPEARANCEUR, LABSPEC, [place], GLUCOSEU, [place], [place], [place],  [place], [place], NITRITE, [place]   Radiology Studies: Reviewed images personally in health database    Scheduled Meds: . amLODipine  10 mg Oral Daily  . aspirin EC  325 mg Oral Daily  . atorvastatin  40 mg Oral QHS  . carvedilol  12.5 mg Oral BID WC  . cloNIDine  0.2 mg Transdermal Weekly  . divalproex  125 mg Oral Q12H  . famotidine  20 mg Oral QHS  . feeding supplement (GLUCERNA SHAKE)  237 mL Oral TID BM  . finasteride  5 mg Oral Daily  . gabapentin  100 mg Oral QHS  . hydrocortisone   Rectal BID  . insulin aspart  0-9 Units Subcutaneous TID WC  . insulin glargine  15 Units Subcutaneous QHS  . isosorbide mononitrate  30 mg Oral Daily  . Melatonin  3 mg Oral QHS  . mirtazapine  7.5 mg Oral QHS  . polyethylene glycol  17 g Oral Daily  . senna-docusate  1 tablet Oral BID  . tamsulosin  0.4 mg Oral Daily   Continuous Infusions:   LOS: 33 days    Time spent: [place], MD [facility] [phone]   If  7PM-7AM, please contact night-coverage [email or web address] Password [email or web address] 10/09/2017, 2:11 PM

## 2017-10-10 LAB — GLUCOSE, CAPILLARY
GLUCOSE-CAPILLARY: 121 mg/dL — AB (ref 65–99)
GLUCOSE-CAPILLARY: 169 mg/dL — AB (ref 65–99)
Glucose-Capillary: 161 mg/dL — ABNORMAL HIGH (ref 65–99)
Glucose-Capillary: 132 mg/dL — ABNORMAL HIGH (ref 65–99)

## 2017-10-10 NOTE — Progress Notes (Signed)
Pt refused physical assessment last night and vital signs this morning will continue to monitor

## 2017-10-10 NOTE — Plan of Care (Signed)
Progressing been little bit calm and coperative for the shift

## 2017-10-11 LAB — GLUCOSE, CAPILLARY
GLUCOSE-CAPILLARY: 100 mg/dL — AB (ref 65–99)
GLUCOSE-CAPILLARY: 68 mg/dL (ref 65–99)
GLUCOSE-CAPILLARY: 156 mg/dL — AB (ref 65–99)
GLUCOSE-CAPILLARY: 136 mg/dL — AB (ref 65–99)

## 2017-10-11 NOTE — Progress Notes (Signed)
PROGRESS NOTE  Shawn Pearson AJO:[id number] DOB: 05-Aug-1941 DOA: 09/05/2017 PCP: [name], NP  HPI/Recap of past 22 hours: 76 year old male,nursing home resident,who presented with chest pain and uncontrolled hypertension. Patient is known to have DM, CAD, dementia, chronic kidney disease, hypertension, stage III rectal CA, severe PVD with bilateral LE amputation. Apparently patient hadbeen refusing his medications at the skilled nursing facility. Patient was admitted to the hospital working diagnosis uncontrolled hypertension. While in hospital reported to be suicidal and homicidal.He was seen by psychiatry, recommend IV Depakote,and inpatient geropsychiatric facility. While on this admission, developed rectal bleeding 12/15-diagnosed with adenocarcinoma 02/2017 at time of colonoscopy and previous GI bleed 03/2017, underwent sigmoidoscopy with APC ablation to the bleeding tumor +/- radiation therapy. GI was consulted during this admission, felt there was nothing different they would offer.  Today, pt noted to be sleeping, easily arousable. Denies chest pain, SOB, abdominal pain, N/V/D/C, fever/chills.  Still having BRB. Patient's management severely hampered by patient refusing care and sometimes being aggressive/abusive to staff. Awaiting placement to geripsychiatry facility.   Assessment/Plan: Principal Problem:   Dementia Active Problems:   Hypertensive urgency   CKD stage 3 secondary to diabetes ([facility])   Rectal cancer ([facility])   Chest pain   Acute on chronic anemia   Noncompliance with medication regimen   Agitation   Elevated troponin   Malignant neoplasm of colon ([place])   Rectal bleeding   Palliative care by specialist   DNR (do not resuscitate)  #Lower GI Bleed with drop in Hgb Stable Likely due to bleeding rectal Ca diagnosed in 02/2017 Continues to have brb Lab work not available as patient refusing, continuing Ansusol cream bid Tramadol 50mg   for pains  CT  abd/pelvis/chest: Non-obstructing rectal mass, no evidence of mets in the body  #Dementia,with suicidal/homicidal ideation Continuesitter one to one plussuicidal precautions. Ondivaloprexandmirtazapine, melatonin. Waiting placement at psychiatric facility Still waiting for Palliative medicine input regarding overall GOC  #Hypertension Controlled when taking meds Continue amlodipine 10, carvedilol 12.5 bd, clonidine changed to patch 12/18, isosorbide 20 od if willing to take meds  #Combined systolic/diastolic CHF Stable with no clinical signs of exacerbation ECHO showed LVEF 40-45%, Grade 2 DD, hypokinesis of the inf wall CXR: No acute cardiopulmonary changes No need diuretic  #Esophagitis Continue withranitidine  #Normocytic anemia Stable S/p 1U of PRBC on 10/03/17 and 2U of prbc on 12/18 Hgb baseline around 10 Will monitor hgb prn as pt continues to refuse  #Type 2 diabetes mellitus Continue basal insulin lantus 15 U and sliding scale, CBG  #Chronic kidney disease stage III Stable, avoid nephrotoxic medications.  #Stage III rectal adenocarcinoma. GI consulted for further management due to bleed-stopping laxatives and see if helps decrease bleeding from rectum No further intervention per GI    Code Status: DNR  Family Communication: None at bedside   Disposition Plan: Awaiting placement   Consultants:  GI  Procedures:  None  Antimicrobials:  None  DVT prophylaxis:  SCDs   Objective: Vitals:   10/10/17 1708 10/10/17 2120 10/11/17 0608 10/11/17 1216  BP: (!) 155/86 (!) 158/64 (!) 175/61 (!) 143/48  Pulse: 72 67 64 65  Resp:  18 16   Temp:  98 F (36.7 C) 97.7 F (36.5 C)   TempSrc:  Oral    SpO2:  97% 98%   Weight:      Height:        Intake/Output Summary (Last 24 hours) at 10/11/2017 1333 Last data filed at 10/11/2017 0902 Gross per  24 hour  Intake 1133 ml  Output 320 ml  Net 813 ml   Filed Weights   09/20/17 1056  09/21/17 0700 09/22/17 0445  Weight: 94.8 kg (208 lb 14.4 oz) 96 kg (211 lb 10.3 oz) 95.6 kg (210 lb 12.2 oz)    Exam:   General: Alert, awake, not responding to questions  Cardiovascular: S1-S2 present, no added heart sounds  Respiratory: Chest clear bilaterally  Abdomen: Soft, nontender, nondistended, bowel sounds present, BRB per rectum  Musculoskeletal: Bilateral BKA  Skin: Normal  Psychiatry: Unable to assess   Data Reviewed: CBC: Recent Labs  Lab 10/05/17 0205 10/07/17 0746  WBC  --  9.4  HGB 7.8* 7.6*  HCT 24.4* 23.5*  MCV  --  80.2  PLT  --  213   Basic Metabolic Panel: No results for input(s): NA, K, CL, CO2, GLUCOSE, BUN, CREATININE, CALCIUM, MG, PHOS in the last 168 hours. GFR: Estimated Creatinine Clearance: 38.5 mL/min (A) (by C-G formula based on SCr of 1.87 mg/dL (H)). Liver Function Tests: No results for input(s): AST, ALT, ALKPHOS, BILITOT, PROT, ALBUMIN in the last 168 hours. No results for input(s): LIPASE, AMYLASE in the last 168 hours. No results for input(s): AMMONIA in the last 168 hours. Coagulation Profile: No results for input(s): INR, PROTIME in the last 168 hours. Cardiac Enzymes: No results for input(s): CKTOTAL, CKMB, CKMBINDEX, TROPONINI in the last 168 hours. BNP (last 3 results) No results for input(s): PROBNP in the last 8760 hours. HbA1C: No results for input(s): HGBA1C in the last 72 hours. CBG: Recent Labs  Lab 10/10/17 1633 10/10/17 2116 10/11/17 0759 10/11/17 0829 10/11/17 1200  GLUCAP 161* 132* 68 100* 156*   Lipid Profile: No results for input(s): CHOL, HDL, LDLCALC, TRIG, CHOLHDL, LDLDIRECT in the last 72 hours. Thyroid Function Tests: No results for input(s): TSH, T4TOTAL, FREET4, T3FREE, THYROIDAB in the last 72 hours. Anemia Panel: No results for input(s): VITAMINB12, FOLATE, FERRITIN, TIBC, IRON, RETICCTPCT in the last 72 hours. Urine analysis: No results found for: COLORURINE, APPEARANCEUR, LABSPEC,  PHURINE, GLUCOSEU, HGBUR, BILIRUBINUR, KETONESUR, PROTEINUR, UROBILINOGEN, NITRITE, LEUKOCYTESUR Sepsis Labs: @LABRCNTIP (procalcitonin:4,lacticidven:4)  )No results found for this or any previous visit (from the past 240 hour(s)).    Studies: No results found.  Scheduled Meds: . amLODipine  10 mg Oral Daily  . carvedilol  12.5 mg Oral BID WC  . cloNIDine  0.2 mg Transdermal Weekly  . divalproex  125 mg Oral Q12H  . famotidine  20 mg Oral QHS  . feeding supplement (GLUCERNA SHAKE)  237 mL Oral TID BM  . finasteride  5 mg Oral Daily  . gabapentin  100 mg Oral QHS  . insulin aspart  0-9 Units Subcutaneous TID WC  . insulin glargine  15 Units Subcutaneous QHS  . isosorbide mononitrate  30 mg Oral Daily  . Melatonin  3 mg Oral QHS  . mirtazapine  7.5 mg Oral QHS  . tamsulosin  0.4 mg Oral Daily    Continuous Infusions:   LOS: 35 days     Alma Friendly, MD [facility]   If 7PM-7AM, please contact night-coverage [email or web address] Password [email or web address] 10/11/2017, 1:33 PM

## 2017-10-11 NOTE — Progress Notes (Signed)
Hypoglycemic Event  CBG: 68  Treatment: Given 240 mL orange juice.  Symptoms: None.  Follow-up CBG: Time: 0829 CBG Result: 100  Possible Reasons for Event: Fasting since last night.  Comments/MD notified: N/A    Melonie [place]

## 2017-10-12 LAB — GLUCOSE, CAPILLARY
Glucose-Capillary: 108 mg/dL — ABNORMAL HIGH (ref 65–99)
Glucose-Capillary: 158 mg/dL — ABNORMAL HIGH (ref 65–99)
Glucose-Capillary: 220 mg/dL — ABNORMAL HIGH (ref 65–99)

## 2017-10-12 LAB — CBC WITH DIFFERENTIAL/PLATELET
BASOS PCT: 0 %
Basophils Absolute: 0 10*3/uL (ref 0.0–0.1)
Eosinophils Absolute: 0.4 10*3/uL (ref 0.0–0.7)
Eosinophils Relative: 6 %
HEMATOCRIT: 22.8 % — AB (ref 39.0–52.0)
Hemoglobin: 7.3 g/dL — ABNORMAL LOW (ref 13.0–17.0)
Lymphocytes Relative: 14 %
Lymphs Abs: 1 10*3/uL (ref 0.7–4.0)
MCH: 26.2 pg (ref 26.0–34.0)
MCHC: 32 g/dL (ref 30.0–36.0)
MCV: 81.7 fL (ref 78.0–100.0)
MONO ABS: 1 10*3/uL (ref 0.1–1.0)
MONOS PCT: 13 %
NEUTROS ABS: 5.1 10*3/uL (ref 1.7–7.7)
Neutrophils Relative %: 67 %
Platelets: 316 10*3/uL (ref 150–400)
RBC: 2.79 MIL/uL — ABNORMAL LOW (ref 4.22–5.81)
RDW: 15.7 % — AB (ref 11.5–15.5)
WBC: 7.5 10*3/uL (ref 4.0–10.5)

## 2017-10-12 NOTE — Progress Notes (Signed)
CSW confirmed pt is still on [facility] waitlist.  [name] [facility] [phone]

## 2017-10-12 NOTE — Progress Notes (Signed)
PROGRESS NOTE  Shawn Pearson JOA:[id number] DOB: Dec 12, 1940 DOA: 09/05/2017 PCP: [name], NP  HPI/Recap of past 22 hours: 76 year old male,nursing home resident,who presented with chest pain and uncontrolled hypertension. Patient is known to have DM, CAD, dementia, chronic kidney disease, hypertension, stage III rectal CA, severe PVD with bilateral LE amputation. Apparently patient hadbeen refusing his medications at the skilled nursing facility. Patient was admitted to the hospital working diagnosis uncontrolled hypertension. While in hospital reported to be suicidal and homicidal.He was seen by psychiatry, recommend IV Depakote,and inpatient geropsychiatric facility. While on this admission, developed rectal bleeding 12/15-diagnosed with adenocarcinoma 02/2017 at time of colonoscopy and previous GI bleed 03/2017, underwent sigmoidoscopy with APC ablation to the bleeding tumor +/- radiation therapy. GI was consulted during this admission, felt there was nothing different they would offer.  Today, pt noted to be sleeping, easily arousable. Denies chest pain, SOB, abdominal pain, N/V/D/C, fever/chills.  Still c/o rectal pain. Patient's management severely hampered by patient refusing care and sometimes being aggressive/abusive to staff. Awaiting placement to geripsychiatry facility.   Assessment/Plan: Principal Problem:   Dementia Active Problems:   Hypertensive urgency   CKD stage 3 secondary to diabetes (HCC)   Rectal cancer (HCC)   Chest pain   Acute on chronic anemia   Noncompliance with medication regimen   Agitation   Elevated troponin   Malignant neoplasm of colon ([place])   Rectal bleeding   Palliative care by specialist   DNR (do not resuscitate)  #Lower GI Bleed with drop in Hgb Stable Likely due to bleeding rectal Ca diagnosed in 02/2017 Continues to have brb Lab work not available as patient refusing, continuing Ansusol cream bid Tramadol 50mg   for pains  CT  abd/pelvis/chest: Non-obstructing rectal mass, no evidence of mets in the body  #Dementia,with suicidal/homicidal ideation Continuesitter one to one plussuicidal precautions. Ondivaloprexandmirtazapine, melatonin. Waiting placement at psychiatric facility Still waiting for Palliative medicine input regarding overall GOC  #Hypertension Controlled when taking meds Continue amlodipine 10, carvedilol 12.5 bd, clonidine changed to patch 12/18, isosorbide 20 od if willing to take meds  #Combined systolic/diastolic CHF Stable with no clinical signs of exacerbation ECHO showed LVEF 40-45%, Grade 2 DD, hypokinesis of the inf wall CXR: No acute cardiopulmonary changes No need diuretic  #Esophagitis Continue withranitidine  #Normocytic anemia Stable S/p 1U of PRBC on 10/03/17 and 2U of prbc on 12/18 Hgb baseline around 10 Will monitor hgb prn as pt continues to refuse  #Type 2 diabetes mellitus Continue basal insulin lantus 15 U and sliding scale, CBG  #Chronic kidney disease stage III Stable, avoid nephrotoxic medications.  #Stage III rectal adenocarcinoma. GI consulted for further management due to bleed-stopping laxatives and see if helps decrease bleeding from rectum No further intervention per GI    Code Status: DNR  Family Communication: None at bedside   Disposition Plan: Awaiting placement   Consultants:  GI  Procedures:  None  Antimicrobials:  None  DVT prophylaxis:  SCDs   Objective: Vitals:   10/11/17 2139 10/11/17 2139 10/12/17 0412 10/12/17 1450  BP: (!) 173/61 (!) 173/61 (!) 160/53 (!) 147/51  Pulse: 66 66 69 61  Resp: 19 19 20 20   Temp: 97.7 F (36.5 C) 97.7 F (36.5 C) 98.9 F (37.2 C) 98.2 F (36.8 C)  TempSrc: Oral Oral Axillary Oral  SpO2:  98% 94% 94%  Weight:      Height:        Intake/Output Summary (Last 24 hours) at 10/12/2017 1722 Last  data filed at 10/12/2017 0900 Gross per 24 hour  Intake 1037 ml    Output 152 ml  Net 885 ml   Filed Weights   09/20/17 1056 09/21/17 0700 09/22/17 0445  Weight: 94.8 kg (208 lb 14.4 oz) 96 kg (211 lb 10.3 oz) 95.6 kg (210 lb 12.2 oz)    Exam:   General: Alert, awake, more sleepy  Cardiovascular: S1-S2 present, no added heart sounds  Respiratory: Chest clear bilaterally  Abdomen: Soft, nontender, nondistended, bowel sounds present, BRB per rectum  Musculoskeletal: Bilateral BKA  Skin: Normal  Psychiatry: Unable to assess   Data Reviewed: CBC: Recent Labs  Lab 10/07/17 0746 10/12/17 0320  WBC 9.4 7.5  NEUTROABS  --  5.1  HGB 7.6* 7.3*  HCT 23.5* 22.8*  MCV 80.2 81.7  PLT 327 016   Basic Metabolic Panel: No results for input(s): NA, K, CL, CO2, GLUCOSE, BUN, CREATININE, CALCIUM, MG, PHOS in the last 168 hours. GFR: Estimated Creatinine Clearance: 38.5 mL/min (A) (by C-G formula based on SCr of 1.87 mg/dL (H)). Liver Function Tests: No results for input(s): AST, ALT, ALKPHOS, BILITOT, PROT, ALBUMIN in the last 168 hours. No results for input(s): LIPASE, AMYLASE in the last 168 hours. No results for input(s): AMMONIA in the last 168 hours. Coagulation Profile: No results for input(s): INR, PROTIME in the last 168 hours. Cardiac Enzymes: No results for input(s): CKTOTAL, CKMB, CKMBINDEX, TROPONINI in the last 168 hours. BNP (last 3 results) No results for input(s): PROBNP in the last 8760 hours. HbA1C: No results for input(s): HGBA1C in the last 72 hours. CBG: Recent Labs  Lab 10/11/17 0759 10/11/17 0829 10/11/17 1200 10/11/17 1650 10/12/17 0811  GLUCAP 68 100* 156* 136* 108*   Lipid Profile: No results for input(s): CHOL, HDL, LDLCALC, TRIG, CHOLHDL, LDLDIRECT in the last 72 hours. Thyroid Function Tests: No results for input(s): TSH, T4TOTAL, FREET4, T3FREE, THYROIDAB in the last 72 hours. Anemia Panel: No results for input(s): VITAMINB12, FOLATE, FERRITIN, TIBC, IRON, RETICCTPCT in the last 72 hours. Urine  analysis: No results found for: COLORURINE, APPEARANCEUR, LABSPEC, PHURINE, GLUCOSEU, HGBUR, BILIRUBINUR, KETONESUR, PROTEINUR, UROBILINOGEN, NITRITE, LEUKOCYTESUR Sepsis Labs: @LABRCNTIP (procalcitonin:4,lacticidven:4)  )No results found for this or any previous visit (from the past 240 hour(s)).    Studies: No results found.  Scheduled Meds: . amLODipine  10 mg Oral Daily  . carvedilol  12.5 mg Oral BID WC  . cloNIDine  0.2 mg Transdermal Weekly  . divalproex  125 mg Oral Q12H  . famotidine  20 mg Oral QHS  . feeding supplement (GLUCERNA SHAKE)  237 mL Oral TID BM  . finasteride  5 mg Oral Daily  . gabapentin  100 mg Oral QHS  . insulin aspart  0-9 Units Subcutaneous TID WC  . insulin glargine  15 Units Subcutaneous QHS  . isosorbide mononitrate  30 mg Oral Daily  . Melatonin  3 mg Oral QHS  . mirtazapine  7.5 mg Oral QHS  . tamsulosin  0.4 mg Oral Daily    Continuous Infusions:   LOS: 36 days     Alma Friendly, MD [facility]   If 7PM-7AM, please contact night-coverage [email or web address] Password [email or web address] 10/12/2017, 5:22 PM

## 2017-10-13 LAB — GLUCOSE, CAPILLARY
GLUCOSE-CAPILLARY: 79 mg/dL (ref 65–99)
GLUCOSE-CAPILLARY: 87 mg/dL (ref 65–99)
GLUCOSE-CAPILLARY: 157 mg/dL — AB (ref 65–99)
Glucose-Capillary: 108 mg/dL — ABNORMAL HIGH (ref 65–99)

## 2017-10-13 MED ORDER — HYDRALAZINE HCL 25 MG PO TABS
25.0000 mg | ORAL_TABLET | Freq: Once | ORAL | Status: AC
  Administered 2017-10-14: 25 mg via ORAL
  Filled 2017-10-13: qty 1

## 2017-10-13 NOTE — Progress Notes (Signed)
Patient refused most medications throughout the day. Patient had a complaint of headache. Patient informed the headache maybe due to the fact that he has refused his blood pressure medication and all other medications. Patient given ultram for pain and patient agreed to take his evening dose of coreg. No other changes at this time continuing to monitor patient.

## 2017-10-13 NOTE — Progress Notes (Signed)
PROGRESS NOTE  Shawn Pearson ZYY:[id number] DOB: 10/14/1941 DOA: 09/05/2017 PCP: [name], NP  HPI/Recap of past 60 hours: 76 year old male,nursing home resident,who presented with chest pain and uncontrolled hypertension. Patient is known to have DM, CAD, dementia, chronic kidney disease, hypertension, stage III rectal CA, severe PVD with bilateral LE amputation. Apparently patient hadbeen refusing his medications at the skilled nursing facility. Patient was admitted to the hospital working diagnosis uncontrolled hypertension. While in hospital reported to be suicidal and homicidal.He was seen by psychiatry, recommend IV Depakote,and inpatient geropsychiatric facility. While on this admission, developed rectal bleeding 12/15-diagnosed with adenocarcinoma 02/2017 at time of colonoscopy and previous GI bleed 03/2017, underwent sigmoidoscopy with APC ablation to the bleeding tumor +/- radiation therapy. GI was consulted during this admission, felt there was nothing different they would offer.  Today, pt was very verbally abusive, when asked questions, which he refused to answer. Patient's management severely hampered by patient refusing care and sometimes being aggressive/abusive to staff. Awaiting placement to geripsychiatry facility.   Assessment/Plan: Principal Problem:   Dementia Active Problems:   Hypertensive urgency   CKD stage 3 secondary to diabetes (HCC)   Rectal cancer (HCC)   Chest pain   Acute on chronic anemia   Noncompliance with medication regimen   Agitation   Elevated troponin   Malignant neoplasm of colon ([place])   Rectal bleeding   Palliative care by specialist   DNR (do not resuscitate)  #Lower GI Bleed with drop in Hgb Stable Likely due to bleeding rectal Ca diagnosed in 02/2017 Continues to have brb Lab work not available as patient refusing, continuing Ansusol cream bid Tramadol 50mg   for pains  CT abd/pelvis/chest: Non-obstructing rectal mass, no  evidence of mets in the body  #Dementia,with suicidal/homicidal ideation Continuesitter one to one plussuicidal precautions. Ondivaloprexandmirtazapine, melatonin. Waiting placement at psychiatric facility Still waiting for Palliative medicine input regarding overall GOC  #Hypertension Controlled when taking meds Continue amlodipine 10, carvedilol 12.5 bd, clonidine changed to patch 12/18, isosorbide 20 od if willing to take meds  #Combined systolic/diastolic CHF Stable with no clinical signs of exacerbation ECHO showed LVEF 40-45%, Grade 2 DD, hypokinesis of the inf wall CXR: No acute cardiopulmonary changes No need diuretic  #Esophagitis Continue withranitidine  #Normocytic anemia Stable S/p 1U of PRBC on 10/03/17 and 2U of prbc on 12/18 Hgb baseline around 10 Will monitor hgb prn as pt continues to refuse  #Type 2 diabetes mellitus Continue basal insulin lantus 15 U and sliding scale, CBG  #Chronic kidney disease stage III Stable, avoid nephrotoxic medications.  #Stage III rectal adenocarcinoma. GI consulted for further management due to bleed-stopping laxatives and see if helps decrease bleeding from rectum No further intervention per GI    Code Status: DNR  Family Communication: None at bedside   Disposition Plan: Awaiting placement   Consultants:  GI  Procedures:  None  Antimicrobials:  None  DVT prophylaxis:  SCDs   Objective: Vitals:   10/12/17 1450 10/12/17 2025 10/13/17 0425 10/13/17 0626  BP: (!) 147/51 (!) 166/58 (!) 165/58   Pulse: 61 65 63   Resp: 20 18 20    Temp: 98.2 F (36.8 C) 97.9 F (36.6 C) (!) 96.8 F (36 C) 97.6 F (36.4 C)  TempSrc: Oral Oral Axillary Oral  SpO2: 94% 96% 96%   Weight:      Height:        Intake/Output Summary (Last 24 hours) at 10/13/2017 1306 Last data filed at 10/13/2017 0900 Gross per  24 hour  Intake 680 ml  Output 100 ml  Net 580 ml   Filed Weights   09/20/17 1056  09/21/17 0700 09/22/17 0445  Weight: 94.8 kg (208 lb 14.4 oz) 96 kg (211 lb 10.3 oz) 95.6 kg (210 lb 12.2 oz)    Exam:   General: Alert, awake, more sleepy  Cardiovascular: S1-S2 present, no added heart sounds  Respiratory: Chest clear bilaterally  Abdomen: Soft, nontender, nondistended, bowel sounds present  Musculoskeletal: Bilateral BKA  Skin: Normal  Psychiatry: Agitated   Data Reviewed: CBC: Recent Labs  Lab 10/07/17 0746 10/12/17 0320  WBC 9.4 7.5  NEUTROABS  --  5.1  HGB 7.6* 7.3*  HCT 23.5* 22.8*  MCV 80.2 81.7  PLT 327 903   Basic Metabolic Panel: No results for input(s): NA, K, CL, CO2, GLUCOSE, BUN, CREATININE, CALCIUM, MG, PHOS in the last 168 hours. GFR: Estimated Creatinine Clearance: 38.5 mL/min (A) (by C-G formula based on SCr of 1.87 mg/dL (H)). Liver Function Tests: No results for input(s): AST, ALT, ALKPHOS, BILITOT, PROT, ALBUMIN in the last 168 hours. No results for input(s): LIPASE, AMYLASE in the last 168 hours. No results for input(s): AMMONIA in the last 168 hours. Coagulation Profile: No results for input(s): INR, PROTIME in the last 168 hours. Cardiac Enzymes: No results for input(s): CKTOTAL, CKMB, CKMBINDEX, TROPONINI in the last 168 hours. BNP (last 3 results) No results for input(s): PROBNP in the last 8760 hours. HbA1C: No results for input(s): HGBA1C in the last 72 hours. CBG: Recent Labs  Lab 10/12/17 0811 10/12/17 1814 10/12/17 2024 10/13/17 0813 10/13/17 1210  GLUCAP 108* 158* 220* 79 108*   Lipid Profile: No results for input(s): CHOL, HDL, LDLCALC, TRIG, CHOLHDL, LDLDIRECT in the last 72 hours. Thyroid Function Tests: No results for input(s): TSH, T4TOTAL, FREET4, T3FREE, THYROIDAB in the last 72 hours. Anemia Panel: No results for input(s): VITAMINB12, FOLATE, FERRITIN, TIBC, IRON, RETICCTPCT in the last 72 hours. Urine analysis: No results found for: COLORURINE, APPEARANCEUR, LABSPEC, PHURINE, GLUCOSEU, HGBUR,  BILIRUBINUR, KETONESUR, PROTEINUR, UROBILINOGEN, NITRITE, LEUKOCYTESUR Sepsis Labs: @LABRCNTIP (procalcitonin:4,lacticidven:4)  )No results found for this or any previous visit (from the past 240 hour(s)).    Studies: No results found.  Scheduled Meds: . amLODipine  10 mg Oral Daily  . carvedilol  12.5 mg Oral BID WC  . cloNIDine  0.2 mg Transdermal Weekly  . divalproex  125 mg Oral Q12H  . famotidine  20 mg Oral QHS  . feeding supplement (GLUCERNA SHAKE)  237 mL Oral TID BM  . finasteride  5 mg Oral Daily  . gabapentin  100 mg Oral QHS  . insulin aspart  0-9 Units Subcutaneous TID WC  . insulin glargine  15 Units Subcutaneous QHS  . isosorbide mononitrate  30 mg Oral Daily  . Melatonin  3 mg Oral QHS  . mirtazapine  7.5 mg Oral QHS  . tamsulosin  0.4 mg Oral Daily    Continuous Infusions:   LOS: 37 days     Alma Friendly, MD [facility]   If 7PM-7AM, please contact night-coverage [email or web address] Password [email or web address] 10/13/2017, 1:06 PM

## 2017-10-14 ENCOUNTER — Inpatient Hospital Stay (HOSPITAL_COMMUNITY): Payer: Medicare Other

## 2017-10-14 LAB — GLUCOSE, CAPILLARY
Glucose-Capillary: 74 mg/dL (ref 65–99)
Glucose-Capillary: 198 mg/dL — ABNORMAL HIGH (ref 65–99)
Glucose-Capillary: 159 mg/dL — ABNORMAL HIGH (ref 65–99)
Glucose-Capillary: 158 mg/dL — ABNORMAL HIGH (ref 65–99)

## 2017-10-14 NOTE — Consult Note (Signed)
[facility] Psych Consult Progress Note  10/14/2017 11:54 PM Shawn Pearson  MRN:  [id number] Subjective:   Shawn Pearson shook his head no when asked about his mood. He reports, "I'm sleeping all the time" when asked about his sleep. He inquired about his recent "xray" results and became agitated with questions when asked why he completed imaging.   Principal Problem: Dementia Diagnosis:   Patient Active Problem List   Diagnosis Date Noted  . Palliative care by specialist [Z51.5]   . DNR (do not resuscitate) [Z66]   . Malignant neoplasm of colon ([place]) [C18.9]   . Rectal bleeding [K62.5]   . Elevated troponin [R74.8]   . Agitation [R45.1]   . Dementia [F03.90] 09/06/2017  . Noncompliance with medication regimen [Z91.14] 09/06/2017  . Acute on chronic anemia [D64.9] 08/30/2017  . Hypertension [I10] 08/30/2017  . Hypertensive urgency [I16.0] 08/21/2017  . DM2 (diabetes mellitus, type 2) ([place]) [E11.9] 08/21/2017  . CKD stage 3 secondary to diabetes ([place]) [X54.00, N18.3] 08/21/2017  . Rectal cancer ([place]) [C20] 08/21/2017  . Hypertensive emergency [I16.1] 08/21/2017  . Chest pain [R07.9]    Total Time spent with patient: 15 minutes  Past Psychiatric History: Dementia   Past Medical History:  Past Medical History:  Diagnosis Date  . Aortic atherosclerosis ([place])   . Dementia   . Diabetes mellitus without complication ([place])   . History of angina   . Hypertension   . Renal disorder    CKD stage 3    Past Surgical History:  Procedure Laterality Date  . BELOW KNEE LEG AMPUTATION Bilateral    Family History: History reviewed. No pertinent family history. Family Psychiatric  History: Unknown  Social History:  Social History   Substance and Sexual Activity  Alcohol Use No     Social History   Substance and Sexual Activity  Drug Use No    Social History   Socioeconomic History  . Marital status: Widowed    Spouse name: None  . Number of children: None  . Years of education:  None  . Highest education level: None  Social Needs  . Financial resource strain: None  . Food insecurity - worry: None  . Food insecurity - inability: None  . Transportation needs - medical: None  . Transportation needs - non-medical: None  Occupational History  . None  Tobacco Use  . Smoking status: Never Smoker  . Smokeless tobacco: Never Used  Substance and Sexual Activity  . Alcohol use: No  . Drug use: No  . Sexual activity: No  Other Topics Concern  . None  Social History Narrative  . None    Sleep: Good  Appetite:  Poor  Current Medications: Current Facility-Administered Medications  Medication Dose Route Frequency Provider Last Rate Last Dose  . acetaminophen (TYLENOL) tablet 650 mg  650 mg Oral Q4H PRN Phillips Grout, MD   650 mg at 10/12/17 1454  . amLODipine (NORVASC) tablet 10 mg  10 mg Oral Daily Patrecia Pour, MD   10 mg at 10/14/17 8676  . carvedilol (COREG) tablet 12.5 mg  12.5 mg Oral BID WC Alphonzo Grieve, MD   12.5 mg at 10/14/17 1653  . cloNIDine (CATAPRES - Dosed in mg/24 hr) patch 0.2 mg  0.2 mg Transdermal Weekly Alma Friendly, MD      . divalproex (DEPAKOTE SPRINKLE) capsule 125 mg  125 mg Oral Q12H Patrecia Pour, MD   125 mg at 10/14/17 2340  . famotidine (PEPCID) tablet  20 mg  20 mg Oral QHS Arrien, Jimmy Picket, MD   20 mg at 10/14/17 2340  . feeding supplement (GLUCERNA SHAKE) (GLUCERNA SHAKE) liquid 237 mL  237 mL Oral TID BM Nita Sells, MD   237 mL at 10/14/17 2033  . finasteride (PROSCAR) tablet 5 mg  5 mg Oral Daily Derrill Kay A, MD   5 mg at 10/14/17 7564  . gabapentin (NEURONTIN) capsule 100 mg  100 mg Oral QHS Patrecia Pour, MD   100 mg at 10/14/17 2340  . hydrALAZINE (APRESOLINE) injection 5 mg  5 mg Intravenous Q4H PRN Gardiner Barefoot, NP   5 mg at 10/10/17 3329  . insulin aspart (novoLOG) injection 0-9 Units  0-9 Units Subcutaneous TID WC Theodis Blaze, MD   2 Units at 10/14/17 1653  . insulin glargine  (LANTUS) injection 15 Units  15 Units Subcutaneous QHS Georgette Shell, MD   15 Units at 10/14/17 2342  . ipratropium-albuterol (DUONEB) 0.5-2.5 (3) MG/3ML nebulizer solution 3 mL  3 mL Nebulization Q6H PRN Alma Friendly, MD   3 mL at 10/04/17 1211  . isosorbide mononitrate (IMDUR) 24 hr tablet 30 mg  30 mg Oral Daily Regalado, Belkys A, MD   30 mg at 10/14/17 0923  . Melatonin TABS 3 mg  3 mg Oral QHS Regalado, Belkys A, MD   3 mg at 10/14/17 2340  . mirtazapine (REMERON) tablet 7.5 mg  7.5 mg Oral QHS Patrecia Pour, MD   7.5 mg at 10/14/17 2341  . polyvinyl alcohol (LIQUIFILM TEARS) 1.4 % ophthalmic solution 1 drop  1 drop Both Eyes PRN Dana Allan I, MD   1 drop at 10/14/17 2341  . simethicone (MYLICON) chewable tablet 80 mg  80 mg Oral QID PRN Arrien, Jimmy Picket, MD   80 mg at 10/12/17 1004  . tamsulosin (FLOMAX) capsule 0.4 mg  0.4 mg Oral Daily Derrill Kay A, MD   0.4 mg at 10/14/17 0924  . traMADol (ULTRAM) tablet 50 mg  50 mg Oral Q12H PRN Nita Sells, MD   50 mg at 10/14/17 2033    Lab Results:  Results for orders placed or performed during the hospital encounter of 09/05/17 (from the past 48 hour(s))  Glucose, capillary     Status: None   Collection Time: 10/13/17  8:13 AM  Result Value Ref Range   Glucose-Capillary 79 65 - 99 mg/dL  Glucose, capillary     Status: Abnormal   Collection Time: 10/13/17 12:10 PM  Result Value Ref Range   Glucose-Capillary 108 (H) 65 - 99 mg/dL  Glucose, capillary     Status: None   Collection Time: 10/13/17  4:48 PM  Result Value Ref Range   Glucose-Capillary 87 65 - 99 mg/dL  Glucose, capillary     Status: Abnormal   Collection Time: 10/13/17  8:55 PM  Result Value Ref Range   Glucose-Capillary 157 (H) 65 - 99 mg/dL  Glucose, capillary     Status: None   Collection Time: 10/14/17  7:51 AM  Result Value Ref Range   Glucose-Capillary 74 65 - 99 mg/dL  Glucose, capillary     Status: Abnormal   Collection Time:  10/14/17  1:41 PM  Result Value Ref Range   Glucose-Capillary 198 (H) 65 - 99 mg/dL  Glucose, capillary     Status: Abnormal   Collection Time: 10/14/17  4:41 PM  Result Value Ref Range   Glucose-Capillary 159 (H) 65 - 99  mg/dL  Glucose, capillary     Status: Abnormal   Collection Time: 10/14/17 11:02 PM  Result Value Ref Range   Glucose-Capillary 158 (H) 65 - 99 mg/dL    Musculoskeletal: Strength & Muscle Tone:Decreased secondary to physical deconditioning. Gait & Station:He hasbilateral BKA. Patient leans:N/A  Psychiatric Specialty Exam: Physical Exam  Nursing note and vitals reviewed. Constitutional: He appears well-developed and well-nourished.  HENT:  Head: Normocephalic and atraumatic.  Neck: Normal range of motion.  Respiratory: Effort normal.  Musculoskeletal: Normal range of motion.  Neurological: He is alert.  Oriented to self.   Skin: No rash noted.  Psychiatric: His speech is normal. Thought content normal. His affect is labile. He is agitated. Cognition and memory are impaired. He expresses impulsivity.    Review of Systems  Unable to perform ROS: Other  Patient was unwilling to participate in interview today.   Blood pressure (!) 175/67, pulse 73, temperature 98.4 F (36.9 C), temperature source Oral, resp. rate 18, height 6\' 2"  (1.88 m), weight 95.6 kg (210 lb 12.2 oz), SpO2 99 %.Body mass index is 27.06 kg/m.  General Appearance: Well Groomed, elderly African American male with bilateral BKA, wearing a hospital gown and lying in bed. NAD.  Eye Contact:  Poor  Speech:  Clear and Coherent and Normal Rate  Volume:  Increased  Mood:  He nodded his head no when asked about his mood.  Affect:  Labile  Thought Process:  Goal Directed and Linear  Orientation:  Other:  Oriented to self.  Thought Content:  Logical  Suicidal Thoughts:  UTA since patient unwilling to participate in interview.   Homicidal Thoughts:  UTA since patient unwilling to participate in  interview.   Memory:  UTA since patient unwilling to participate in interview.   Judgement:  Impaired  Insight:  Lacking  Psychomotor Activity:  Decreased with minimal spontaneous movements.   Concentration:  Concentration: Poor and Attention Span: Poor  Recall:  UTA since patient unwilling to participate in interview.   Fund of Knowledge:  Poor  Language:  Poor  Akathisia:  No  Handed:  Left  AIMS (if indicated):   N/A  Assets:  Housing  ADL's:  Impaired  Cognition:  Impaired due to dementia.  Sleep:   Okay   Assessment:  Shawn Pearson is a 76 y.o. male who was admitted withwithchest pain in the setting of uncontrolled blood pressure secondary to poor medication compliance.He has episodic periods of improved behavior but he generally seems to be doing better since consistently taking his psychotropic medications. He still becomes irritated and aggressive at times and continues to warrant geropsychiatric placement.    Treatment Plan Summary: -ContinueDepakote125 mg BIDfor agitation.Can titrate evening dose by 125 mg increments as clinically indicated for worsening agitation.  -Increase Remeron 7.5 mg qhs to 15 mg qhs for irritability and appetitestimulation.There is greater antihistamine activity at lower dose so patient may benefit from higher dose to reduce daytime drowsiness while improving mood. -Avoid medications that can worsen confusion and agitation such as antihistamines, anticholingerics and benzodiazepines. Benzodiazpeines can alsocause disinhibition. -ContinueMelatonin3mg  qhs to regulate sleep/wake cycle. Encourage patient to stay awake in the daytime so that he may sleep at night. -Antipsychotic use is not advised given prolonged QTc and risk for arrhthymias (QTc 526 on 11/17 and decreased to 501 on 12/15). -Usesoft restraints as needed for imminent risk of harming self or others and if needed for emergent treatment. -Recommend geropsychiatric  hospitalization for further stabilization and treatment. -Psychiatry will  follow as clinically needed.    Faythe Dingwall, DO 10/14/2017, 11:54 PM

## 2017-10-14 NOTE — Progress Notes (Signed)
PROGRESS NOTE  Shawn Pearson [id number]:[id number] DOB: 1941-09-12 DOA: 09/05/2017 PCP: [name], NP  HPI/Recap of past 89 hours: 76 year old male,nursing home resident,who presented with chest pain and uncontrolled hypertension. Patient is known to have DM, CAD, dementia, chronic kidney disease, hypertension, stage III rectal CA, severe PVD with bilateral LE amputation. Apparently patient hadbeen refusing his medications at the skilled nursing facility. Patient was admitted to the hospital working diagnosis uncontrolled hypertension. While in hospital reported to be suicidal and homicidal.He was seen by psychiatry, recommend IV Depakote,and inpatient geropsychiatric facility. While on this admission, developed rectal bleeding 12/15-diagnosed with adenocarcinoma 02/2017 at time of colonoscopy and previous GI bleed 03/2017, underwent sigmoidoscopy with APC ablation to the bleeding tumor +/- radiation therapy. GI was consulted during this admission, felt there was nothing different they would offer.  Today, pt refusing to answer questions. Abdomen noted to be distended. Per NT, pt has been having frequent formed BM. Patient's management severely hampered by patient refusing care and sometimes being aggressive/abusive to staff. Awaiting placement to geripsychiatry facility.   Assessment/Plan: Principal Problem:   Dementia Active Problems:   Hypertensive urgency   CKD stage 3 secondary to diabetes (HCC)   Rectal cancer (HCC)   Chest pain   Acute on chronic anemia   Noncompliance with medication regimen   Agitation   Elevated troponin   Malignant neoplasm of colon [facility])   Rectal bleeding   Palliative care by specialist   DNR (do not resuscitate)  #Lower GI Bleed with drop in Hgb Stable Likely due to bleeding rectal Ca diagnosed in 02/2017 Continues to have brb Lab work inconsistent as patient refusing, continuing Ansusol cream bid Tramadol 50mg   for pains  CT abd/pelvis/chest:  Non-obstructing rectal mass, no evidence of mets in the body  #Dementia,with suicidal/homicidal ideation Continuesitter one to one plussuicidal precautions. Ondivaloprexandmirtazapine, melatonin. Waiting placement at psychiatric facility Still waiting for Palliative medicine input regarding overall GOC  #Hypertension Controlled when taking meds Continue amlodipine 10, carvedilol 12.5 bd, clonidine changed to patch 12/18, isosorbide 20 od if willing to take meds  #Combined systolic/diastolic CHF Stable with no clinical signs of exacerbation ECHO showed LVEF 40-45%, Grade 2 DD, hypokinesis of the inf wall CXR: No acute cardiopulmonary changes No need diuretic  #Esophagitis Continue withranitidine  #Normocytic anemia Stable S/p 1U of PRBC on 10/03/17 and 2U of prbc on 12/18 Hgb baseline around 10 Will monitor hgb prn as pt continues to refuse  #Type 2 diabetes mellitus Continue basal insulin lantus 15 U and sliding scale, CBG  #Chronic kidney disease stage III Stable, avoid nephrotoxic medications.  #Stage III rectal adenocarcinoma. GI consulted for further management due to bleed-stopping laxatives and see if helps decrease bleeding from rectum No further intervention per GI    Code Status: DNR  Family Communication: None at bedside   Disposition Plan: Awaiting placement   Consultants:  GI  Procedures:  None  Antimicrobials:  None  DVT prophylaxis:  SCDs   Objective: Vitals:   10/13/17 2058 10/13/17 2219 10/14/17 0153 10/14/17 0257  BP: (!) 184/66 (!) 182/61 (!) 181/64 (!) 167/59  Pulse: 67 69 67   Resp: 18     Temp: 98.5 F (36.9 C)     TempSrc: Oral     SpO2: 100% 98%    Weight:      Height:        Intake/Output Summary (Last 24 hours) at 10/14/2017 1213 Last data filed at 10/14/2017 1102 Gross per 24 hour  Intake  2554 ml  Output 275 ml  Net 2279 ml   Filed Weights   09/20/17 1056 09/21/17 0700 09/22/17 0445    Weight: 94.8 kg (208 lb 14.4 oz) 96 kg (211 lb 10.3 oz) 95.6 kg (210 lb 12.2 oz)    Exam:   General: Alert, awake, more sleepy  Cardiovascular: S1-S2 present, no added heart sounds  Respiratory: Chest clear bilaterally  Abdomen: Soft, nontender, +distended, bowel sounds present  Musculoskeletal: Bilateral BKA  Skin: Normal  Psychiatry: Agitated   Data Reviewed: CBC: Recent Labs  Lab 10/12/17 0320  WBC 7.5  NEUTROABS 5.1  HGB 7.3*  HCT 22.8*  MCV 81.7  PLT 629   Basic Metabolic Panel: No results for input(s): NA, K, CL, CO2, GLUCOSE, BUN, CREATININE, CALCIUM, MG, PHOS in the last 168 hours. GFR: Estimated Creatinine Clearance: 38.5 mL/min (A) (by C-G formula based on SCr of 1.87 mg/dL (H)). Liver Function Tests: No results for input(s): AST, ALT, ALKPHOS, BILITOT, PROT, ALBUMIN in the last 168 hours. No results for input(s): LIPASE, AMYLASE in the last 168 hours. No results for input(s): AMMONIA in the last 168 hours. Coagulation Profile: No results for input(s): INR, PROTIME in the last 168 hours. Cardiac Enzymes: No results for input(s): CKTOTAL, CKMB, CKMBINDEX, TROPONINI in the last 168 hours. BNP (last 3 results) No results for input(s): PROBNP in the last 8760 hours. HbA1C: No results for input(s): HGBA1C in the last 72 hours. CBG: Recent Labs  Lab 10/13/17 0813 10/13/17 1210 10/13/17 1648 10/13/17 2055 10/14/17 0751  GLUCAP 79 108* 87 157* 74   Lipid Profile: No results for input(s): CHOL, HDL, LDLCALC, TRIG, CHOLHDL, LDLDIRECT in the last 72 hours. Thyroid Function Tests: No results for input(s): TSH, T4TOTAL, FREET4, T3FREE, THYROIDAB in the last 72 hours. Anemia Panel: No results for input(s): VITAMINB12, FOLATE, FERRITIN, TIBC, IRON, RETICCTPCT in the last 72 hours. Urine analysis: No results found for: COLORURINE, APPEARANCEUR, LABSPEC, PHURINE, GLUCOSEU, HGBUR, BILIRUBINUR, KETONESUR, PROTEINUR, UROBILINOGEN, NITRITE,  LEUKOCYTESUR Sepsis Labs: @LABRCNTIP (procalcitonin:4,lacticidven:4)  )No results found for this or any previous visit (from the past 240 hour(s)).    Studies: No results found.  Scheduled Meds: . amLODipine  10 mg Oral Daily  . carvedilol  12.5 mg Oral BID WC  . cloNIDine  0.2 mg Transdermal Weekly  . divalproex  125 mg Oral Q12H  . famotidine  20 mg Oral QHS  . feeding supplement (GLUCERNA SHAKE)  237 mL Oral TID BM  . finasteride  5 mg Oral Daily  . gabapentin  100 mg Oral QHS  . insulin aspart  0-9 Units Subcutaneous TID WC  . insulin glargine  15 Units Subcutaneous QHS  . isosorbide mononitrate  30 mg Oral Daily  . Melatonin  3 mg Oral QHS  . mirtazapine  7.5 mg Oral QHS  . tamsulosin  0.4 mg Oral Daily    Continuous Infusions:   LOS: 38 days     Alma Friendly, MD [facility]   If 7PM-7AM, please contact night-coverage [email or web address] Password [facility] 10/14/2017, 12:13 PM

## 2017-10-15 LAB — BASIC METABOLIC PANEL
Anion gap: 6 (ref 5–15)
BUN: 38 mg/dL — ABNORMAL HIGH (ref 6–20)
CHLORIDE: 107 mmol/L (ref 101–111)
CO2: 22 mmol/L (ref 22–32)
CREATININE: 1.7 mg/dL — AB (ref 0.61–1.24)
Calcium: 7.7 mg/dL — ABNORMAL LOW (ref 8.9–10.3)
GFR calc Af Amer: 43 mL/min — ABNORMAL LOW (ref >60)
GFR calc non Af Amer: 37 mL/min — ABNORMAL LOW (ref >60)
Glucose, Bld: 178 mg/dL — ABNORMAL HIGH (ref 65–99)
Potassium: 4.7 mmol/L (ref 3.5–5.1)
Sodium: 135 mmol/L (ref 135–145)

## 2017-10-15 LAB — CBC WITH DIFFERENTIAL/PLATELET
Basophils Absolute: 0 10*3/uL (ref 0.0–0.1)
Basophils Relative: 0 %
Eosinophils Absolute: 0.4 10*3/uL (ref 0.0–0.7)
Eosinophils Relative: 5 %
HCT: 19 % — ABNORMAL LOW (ref 39.0–52.0)
HEMOGLOBIN: 6.2 g/dL — AB (ref 13.0–17.0)
LYMPHS ABS: 1.1 10*3/uL (ref 0.7–4.0)
Lymphocytes Relative: 15 %
MCH: 26.3 pg (ref 26.0–34.0)
MCHC: 32.6 g/dL (ref 30.0–36.0)
MCV: 80.5 fL (ref 78.0–100.0)
MONOS PCT: 10 %
Monocytes Absolute: 0.8 10*3/uL (ref 0.1–1.0)
NEUTROS ABS: 5.3 10*3/uL (ref 1.7–7.7)
NEUTROS PCT: 70 %
Platelets: 285 10*3/uL (ref 150–400)
RBC: 2.36 MIL/uL — ABNORMAL LOW (ref 4.22–5.81)
RDW: 16.3 % — ABNORMAL HIGH (ref 11.5–15.5)
WBC: 7.6 10*3/uL (ref 4.0–10.5)

## 2017-10-15 LAB — PREPARE RBC (CROSSMATCH)

## 2017-10-15 LAB — GLUCOSE, CAPILLARY
GLUCOSE-CAPILLARY: 219 mg/dL — AB (ref 65–99)
Glucose-Capillary: 90 mg/dL (ref 65–99)
Glucose-Capillary: 245 mg/dL — ABNORMAL HIGH (ref 65–99)

## 2017-10-15 MED ORDER — HYDRALAZINE HCL 20 MG/ML IJ SOLN
5.0000 mg | Freq: Once | INTRAMUSCULAR | Status: AC
  Administered 2017-10-15: 5 mg via INTRAVENOUS
  Filled 2017-10-15: qty 1

## 2017-10-15 MED ORDER — SODIUM CHLORIDE 0.9 % IV SOLN: Freq: Once | INTRAVENOUS | Status: DC

## 2017-10-15 MED ORDER — BISACODYL 10 MG RE SUPP
10.0000 mg | Freq: Every day | RECTAL | Status: DC
Start: 2017-10-15 — End: 2017-11-06
  Administered 2017-10-15 – 2017-11-05 (×17): 10 mg via RECTAL
  Filled 2017-10-15 (×22): qty 1

## 2017-10-15 MED ORDER — SENNOSIDES-DOCUSATE SODIUM 8.6-50 MG PO TABS
1.0000 | ORAL_TABLET | Freq: Two times a day (BID) | ORAL | Status: DC
Start: 2017-10-15 — End: 2017-11-06
  Administered 2017-10-15 – 2017-11-05 (×38): 1 via ORAL
  Filled 2017-10-15 (×40): qty 1

## 2017-10-15 MED ORDER — MIRTAZAPINE 15 MG PO TABS
15.0000 mg | ORAL_TABLET | Freq: Every day | ORAL | Status: DC
  Administered 2017-10-15 – 2017-11-05 (×20): 15 mg via ORAL
  Filled 2017-10-15 (×22): qty 1

## 2017-10-15 NOTE — Progress Notes (Signed)
PROGRESS NOTE  Jash Wahlen ATF:[id number] DOB: 1941-01-22 DOA: 09/05/2017 PCP: [name], NP  HPI/Recap of past 6 hours: 76 year old male,nursing home resident,who presented with chest pain and uncontrolled hypertension. Patient is known to have DM, CAD, dementia, chronic kidney disease, hypertension, stage III rectal CA, severe PVD with bilateral LE amputation. Apparently patient hadbeen refusing his medications at the skilled nursing facility. Patient was admitted to the hospital working diagnosis uncontrolled hypertension. While in hospital reported to be suicidal and homicidal.He was seen by psychiatry, recommend IV Depakote,and inpatient geropsychiatric facility. While on this admission, developed rectal bleeding 12/15-diagnosed with adenocarcinoma 02/2017 at time of colonoscopy and previous GI bleed 03/2017, underwent sigmoidoscopy with APC ablation to the bleeding tumor +/- radiation therapy. GI was consulted during this admission, felt there was nothing different they would offer.  Today, pt refusing to answer questions. Abdomen noted to be distended, abdominal Xray showed large amount of stool throughout colon. Pt noted to have an episode of bloody BM with multiple clots noted. Patient's management severely hampered by patient refusing care and sometimes being aggressive/abusive to staff. Awaiting placement to geripsychiatry facility.   Assessment/Plan: Principal Problem:   Dementia Active Problems:   Hypertensive urgency   CKD stage 3 secondary to diabetes [facility]   Rectal cancer (HCC)   Chest pain   Acute on chronic anemia   Noncompliance with medication regimen   Agitation   Elevated troponin   Malignant neoplasm of colon [facility])   Rectal bleeding   Palliative care by specialist   DNR (do not resuscitate)  #Lower GI Bleed with drop in Hgb-->6.2 on 10/15/17 Likely due to bleeding rectal Ca diagnosed in 02/2017 Continues to have brb, today with multiple clots seen  today 10/15/17 Lab work inconsistent as patient refusing, continuing Ansusol cream bid Tramadol 50mg   for pains  CT abd/pelvis/chest: Non-obstructing rectal mass, no evidence of mets in the body GI consulted, signed off as no further recs, consider re-consulting if rectal bleeding persists for temporary measures  #Severe constipation Abd distension, Xray showed large amount of stool throughout colon, no perforation PO laxatives, soap sud enema, suppositories all ordered pending on what pt agrees to take  #Dementia,with suicidal/homicidal ideation Continuesitter one to one plussuicidal precautions. Ondivaloprexandmirtazapine, melatonin. Waiting placement at psychiatric facility Currently DNR MH on consult  #Hypertension Controlled when taking meds Continue amlodipine 10, carvedilol 12.5 bd, clonidine changed to patch 12/18, isosorbide 20 od if willing to take meds  #Combined systolic/diastolic CHF Stable with no clinical signs of exacerbation ECHO showed LVEF 40-45%, Grade 2 DD, hypokinesis of the inf wall CXR: No acute cardiopulmonary changes No need diuretic  #Esophagitis Continue withranitidine  #Normocytic anemia Stable S/p 1U of PRBC on 10/03/17, 2U of prbc on 12/18, 2U on 10/15/17 Hgb baseline around 10 Daily CBC as pt permits  #Type 2 diabetes mellitus Continue basal insulin lantus 15 U and sliding scale, CBG  #Chronic kidney disease stage III Stable, avoid nephrotoxic medications.  #Stage III rectal adenocarcinoma. GI consulted for further management due to bleed-stopping laxatives and see if helps decrease bleeding from rectum No further intervention per GI    Code Status: DNR  Family Communication: None at bedside   Disposition Plan: Awaiting placement   Consultants:  GI  Procedures:  None  Antimicrobials:  None  DVT prophylaxis:  SCDs   Objective: Vitals:   10/14/17 2305 10/15/17 0604 10/15/17 1105 10/15/17 1541  BP:  (!) 169/66 (!) 164/67 (!) 174/51 (!) 155/65  Pulse:  66  63  Resp:  20  20  Temp:  (!) 97.5 F (36.4 C)  97.8 F (36.6 C)  TempSrc:  Oral  Oral  SpO2:  95%  92%  Weight:      Height:        Intake/Output Summary (Last 24 hours) at 10/15/2017 1620 Last data filed at 10/15/2017 1404 Gross per 24 hour  Intake 2168 ml  Output 450 ml  Net 1718 ml   Filed Weights   09/20/17 1056 09/21/17 0700 09/22/17 0445  Weight: 94.8 kg (208 lb 14.4 oz) 96 kg (211 lb 10.3 oz) 95.6 kg (210 lb 12.2 oz)    Exam:   General: Alert, awake, more sleepy  Cardiovascular: S1-S2 present, no added heart sounds  Respiratory: Chest clear bilaterally  Abdomen: Soft, nontender, +distended, bowel sounds present  Musculoskeletal: Bilateral BKA  Skin: Normal  Psychiatry: Agitated   Data Reviewed: CBC: Recent Labs  Lab 10/12/17 0320 10/15/17 1248  WBC 7.5 7.6  NEUTROABS 5.1 5.3  HGB 7.3* 6.2*  HCT 22.8* 19.0*  MCV 81.7 80.5  PLT 316 557   Basic Metabolic Panel: Recent Labs  Lab 10/15/17 1248  NA 135  K 4.7  CL 107  CO2 22  GLUCOSE 178*  BUN 38*  CREATININE 1.70*  CALCIUM 7.7*   GFR: Estimated Creatinine Clearance: 42.3 mL/min (A) (by C-G formula based on SCr of 1.7 mg/dL (H)). Liver Function Tests: No results for input(s): AST, ALT, ALKPHOS, BILITOT, PROT, ALBUMIN in the last 168 hours. No results for input(s): LIPASE, AMYLASE in the last 168 hours. No results for input(s): AMMONIA in the last 168 hours. Coagulation Profile: No results for input(s): INR, PROTIME in the last 168 hours. Cardiac Enzymes: No results for input(s): CKTOTAL, CKMB, CKMBINDEX, TROPONINI in the last 168 hours. BNP (last 3 results) No results for input(s): PROBNP in the last 8760 hours. HbA1C: No results for input(s): HGBA1C in the last 72 hours. CBG: Recent Labs  Lab 10/14/17 0751 10/14/17 1341 10/14/17 1641 10/14/17 2302 10/15/17 0758  GLUCAP 74 198* 159* 158* 90   Lipid Profile: No  results for input(s): CHOL, HDL, LDLCALC, TRIG, CHOLHDL, LDLDIRECT in the last 72 hours. Thyroid Function Tests: No results for input(s): TSH, T4TOTAL, FREET4, T3FREE, THYROIDAB in the last 72 hours. Anemia Panel: No results for input(s): VITAMINB12, FOLATE, FERRITIN, TIBC, IRON, RETICCTPCT in the last 72 hours. Urine analysis: No results found for: COLORURINE, APPEARANCEUR, LABSPEC, PHURINE, GLUCOSEU, HGBUR, BILIRUBINUR, KETONESUR, PROTEINUR, UROBILINOGEN, NITRITE, LEUKOCYTESUR Sepsis Labs: @LABRCNTIP (procalcitonin:4,lacticidven:4)  )No results found for this or any previous visit (from the past 240 hour(s)).    Studies: No results found.  Scheduled Meds: . amLODipine  10 mg Oral Daily  . bisacodyl  10 mg Rectal Daily  . carvedilol  12.5 mg Oral BID WC  . cloNIDine  0.2 mg Transdermal Weekly  . divalproex  125 mg Oral Q12H  . famotidine  20 mg Oral QHS  . feeding supplement (GLUCERNA SHAKE)  237 mL Oral TID BM  . finasteride  5 mg Oral Daily  . gabapentin  100 mg Oral QHS  . insulin aspart  0-9 Units Subcutaneous TID WC  . insulin glargine  15 Units Subcutaneous QHS  . isosorbide mononitrate  30 mg Oral Daily  . Melatonin  3 mg Oral QHS  . mirtazapine  15 mg Oral QHS  . senna-docusate  1 tablet Oral BID  . tamsulosin  0.4 mg Oral Daily    Continuous Infusions: . sodium chloride  LOS: 39 days     Alma Friendly, MD [facility]   If 7PM-7AM, please contact night-coverage [email or web address] Password [email or web address] 10/15/2017, 4:20 PM

## 2017-10-15 NOTE — Clinical Social Work Note (Signed)
CSW called and confirmed patient is still on [facility] wait list.  Dayton Scrape, [place]

## 2017-10-15 NOTE — Progress Notes (Signed)
Nutrition Follow-up  DOCUMENTATION CODES:   Obesity unspecified  INTERVENTION:   -Continue Glucerna Shake po TID, each supplement provides 220 kcal and 10 grams of protein  NUTRITION DIAGNOSIS:   Inadequate oral intake related to lethargy/confusion as evidenced by meal completion < 25%.  Progressing  GOAL:   Patient will meet greater than or equal to 90% of their needs  Progressing  MONITOR:   PO intake, Supplement acceptance, Labs, Weight trends, Skin, I & O's  REASON FOR ASSESSMENT:   Malnutrition Screening Tool    ASSESSMENT:   Patient is a 76 year old male with past medical history significant for dementia, CKD, HTN , medical noncompliance wth meds, neg stress test in 2/18. Patient was admitted from SNF with chest pain, uncontrolled blood pressure due to non compliance with medication and headache.  He says he does not like the nursing home he is at and that is why he refuses to take his meds. The patient has been non co operative with the Nursing staff, and still refusing his medication. Psych has been consulted as patient likely has dementia with behavioral problems.   12/1- remeron initiated by psychiatry in attempt to stimulate appetite and decrease agitation 12/3- Ensure supplement changed to Glucerna by nursing staff due to increased CBGS 12/23- hypoglycemic event (CBG 63) 12/26- remeron dose increased (7.5 mg to 15 mg) by psych in attempt to improve irritability and appetite stimulation  Pt continues to intermittently refuse care. Meal intake has improved since last visit (PO: 25-100%). Pt also taking Glucerna supplements as ordered per [facility] with very few refusals. Pt prefers strawberry flavor of supplements.   Palliative care has been consulted for goals of care discussion.   Per CSW note, pt remains on priority waiting list for [facility].  Labs reviewed: CBGS: 90-198(inpatient orders for glycemic control are 0-9 units insulin aspart TID with meals and 15 units  insulin glargine q HS).   Diet Order:  Diet Carb Modified Fluid consistency: Thin; Room service appropriate? Yes  EDUCATION NEEDS:   Not appropriate for education at this time  Skin:  Skin Assessment: Reviewed RN Assessment  Last BM:  10/15/17  Height:   Ht Readings from Last 1 Encounters:  09/06/17 6\' 2"  (1.88 m)    Weight:   Wt Readings from Last 1 Encounters:  09/22/17 210 lb 12.2 oz (95.6 kg)    Ideal Body Weight:  75.1 kg  BMI:  Body mass index is 27.06 kg/m.  Estimated Nutritional Needs:   Kcal:  1650-1850  Protein:  80-95 grams  Fluid:  1.6-1.8 L    Avannah Decker A. Jimmye Norman, RD, LDN, CDE Pager: [phone] After hours Pager: [phone]

## 2017-10-15 NOTE — Progress Notes (Signed)
Patient refused lunch time CBG

## 2017-10-15 NOTE — Care Management Important Message (Signed)
Important Message  Patient Details  Name: Shawn Pearson MRN: [id number] Date of Birth: 12-Apr-1941   Medicare Important Message Given:  Yes    [name] May 10/15/2017, 12:08 PM

## 2017-10-15 NOTE — Progress Notes (Signed)
CRITICAL VALUE ALERT  Critical Value:  Hgb 6.2  Date & Time Notied:  1164 10/15/17  Provider Notified: Dr. Horris Latino  Orders Received/Actions taken: Yes

## 2017-10-16 LAB — BPAM RBC
Blood Product Expiration Date: 201901222359
Blood Product Expiration Date: 201901232359
ISSUE DATE / TIME: 201812272043
ISSUE DATE / TIME: 201812271656
Unit Type and Rh: 5100
Unit Type and Rh: 5100

## 2017-10-16 LAB — GLUCOSE, CAPILLARY
GLUCOSE-CAPILLARY: 190 mg/dL — AB (ref 65–99)
GLUCOSE-CAPILLARY: 235 mg/dL — AB (ref 65–99)
Glucose-Capillary: 151 mg/dL — ABNORMAL HIGH (ref 65–99)
Glucose-Capillary: 245 mg/dL — ABNORMAL HIGH (ref 65–99)

## 2017-10-16 LAB — BASIC METABOLIC PANEL
Anion gap: 11 (ref 5–15)
BUN: 43 mg/dL — ABNORMAL HIGH (ref 6–20)
CHLORIDE: 103 mmol/L (ref 101–111)
CO2: 21 mmol/L — ABNORMAL LOW (ref 22–32)
CREATININE: 1.66 mg/dL — AB (ref 0.61–1.24)
Calcium: 8.1 mg/dL — ABNORMAL LOW (ref 8.9–10.3)
GFR calc non Af Amer: 38 mL/min — ABNORMAL LOW (ref >60)
GFR, EST AFRICAN AMERICAN: 44 mL/min — AB (ref >60)
Glucose, Bld: 191 mg/dL — ABNORMAL HIGH (ref 65–99)
POTASSIUM: 5.2 mmol/L — AB (ref 3.5–5.1)
SODIUM: 135 mmol/L (ref 135–145)

## 2017-10-16 LAB — TYPE AND SCREEN
ABO/RH(D): O POS
ANTIBODY SCREEN: NEGATIVE
Unit division: 0
Unit division: 0

## 2017-10-16 LAB — CBC WITH DIFFERENTIAL/PLATELET
Basophils Absolute: 0 10*3/uL (ref 0.0–0.1)
Basophils Relative: 0 %
Eosinophils Absolute: 0.4 10*3/uL (ref 0.0–0.7)
Eosinophils Relative: 4 %
HCT: 25.1 % — ABNORMAL LOW (ref 39.0–52.0)
HEMOGLOBIN: 8.3 g/dL — AB (ref 13.0–17.0)
LYMPHS ABS: 0.9 10*3/uL (ref 0.7–4.0)
LYMPHS PCT: 8 %
MCH: 26.4 pg (ref 26.0–34.0)
MCHC: 33.1 g/dL (ref 30.0–36.0)
MCV: 79.9 fL (ref 78.0–100.0)
Monocytes Absolute: 1.2 10*3/uL — ABNORMAL HIGH (ref 0.1–1.0)
Monocytes Relative: 12 %
NEUTROS ABS: 7.6 10*3/uL (ref 1.7–7.7)
NEUTROS PCT: 76 %
Platelets: 303 10*3/uL (ref 150–400)
RBC: 3.14 MIL/uL — AB (ref 4.22–5.81)
RDW: 16.7 % — ABNORMAL HIGH (ref 11.5–15.5)
WBC: 10.1 10*3/uL (ref 4.0–10.5)

## 2017-10-16 NOTE — Clinical Social Work Note (Signed)
CSW confirmed pt still on wait list at [place], [place]

## 2017-10-16 NOTE — Progress Notes (Signed)
Palliative Medicine RN Note: Discussed pt in team rounds. PMT needs have been addressed. We will sign off at this time. Please re-consult if new needs arise.  Marjie Skiff Delaila Nand, RN, BSN, [facility] 10/16/2017 9:14 AM Office [phone]

## 2017-10-16 NOTE — Progress Notes (Signed)
PROGRESS NOTE  Shawn Pearson YBO:[id number] DOB: 10/04/1941 DOA: 09/05/2017 PCP: [name], NP  HPI/Recap of past 56 hours: 76 year old male,nursing home resident,who presented with chest pain and uncontrolled hypertension. Patient is known to have DM, CAD, dementia, chronic kidney disease, hypertension, stage III rectal CA, severe PVD with bilateral LE amputation. Apparently patient hadbeen refusing his medications at the skilled nursing facility. Patient was admitted to the hospital working diagnosis uncontrolled hypertension. While in hospital reported to be suicidal and homicidal.He was seen by psychiatry, recommend IV Depakote,and inpatient geropsychiatric facility. While on this admission, developed rectal bleeding 12/15-diagnosed with adenocarcinoma 02/2017 at time of colonoscopy and previous GI bleed 03/2017, underwent sigmoidoscopy with APC ablation to the bleeding tumor +/- radiation therapy. GI was consulted during this admission, felt there was nothing different they would offer.  Today, pt refusing to answer questions. Abdomen noted to be distended, abdominal Xray showed large amount of stool throughout colon. Pt noted to have an episode of bloody BM with multiple clots noted. Patient's management severely hampered by patient refusing care and sometimes being aggressive/abusive to staff. Awaiting placement to geripsychiatry facility.   Assessment/Plan:  Stage III rectal adenocarcinoma  -Originally diagnosed in 02/2017 and reportedly treated with some radiation then  -Continues to have intermittent rectal bleeding, back in June underwent sigmoidoscopy and APC ablation  -Seen by gastroenterology this admission, poor candidate for further treatments due to vascular dementia and significant psychiatric issues with agitation and refusal of care  -Has been seen by palliative care this admission and including family meeting with palliative medicine, unfortunately family is not  actively involved  Acute on chronic blood loss anemia -Status post multiple units of PRBC this admission, , 1 unit on 12/15, and 2 units of PRBC yesterday 12/27 -Repeat CBC pending this morning  Constipation -Last KUB with increased stool burden, multiple BMs yesterday after laxatives, hold off on an MRI due to risk of perforation with rectal CA, -Continue current laxatives repeat KUB in 2-3 days  #Dementia,with suicidal/homicidal ideation Continuesitter one to one plussuicidal precautions. Ondivaloprexandmirtazapine, melatonin -Seen by psych M.D. this admission, waiting for placement at psychiatric facility -Now DO NOT RESUSCITATE  Hypertension -By mouth med intake continues to be an unreliable -Continue amlodipine 10, carvedilol 12.5 bd, clonidine changed to patch 12/18, isosorbide 20 od if willing to take meds  Combined systolic/diastolic CHF Stable with no clinical signs of exacerbation ECHO showed LVEF 40-45%, Grade 2 DD, hypokinesis of the inf wall -Euvolemic, monitor  Esophagitis Continue withranitidine  Type 2 diabetes mellitus Continue basal insulin lantus 15 U and sliding scale -Stable now  Chronic kidney disease stage III Stable, avoid nephrotoxic medications.  Code Status: DNR  Family Communication: None at bedside   Disposition Plan: Awaiting placement at [facility]   Consultants:  GI  Psychiatry  Procedures:  None  Antimicrobials:  None  DVT prophylaxis:  SCDs   Objective: Vitals:   10/15/17 2354 10/15/17 2356 10/16/17 0440 10/16/17 1327  BP: (!) 179/73 (!) 179/73 (!) 189/75 (!) 160/55  Pulse: 70 74 71 74  Resp:  20 20 19   Temp:  98 F (36.7 C)  98 F (36.7 C)  TempSrc:  Oral Oral Oral  SpO2: 96% 98% 94% 98%  Weight:      Height:        Intake/Output Summary (Last 24 hours) at 10/16/2017 1331 Last data filed at 10/16/2017 0905 Gross per 24 hour  Intake 2736 ml  Output 700 ml  Net 2036 ml  Filed Weights   09/20/17 1056 09/21/17 0700 09/22/17 0445  Weight: 94.8 kg (208 lb 14.4 oz) 96 kg (211 lb 10.3 oz) 95.6 kg (210 lb 12.2 oz)    Exam:   General: Alert, awake, pleasant, no distress, oriented to self and partly to place  Cardiovascular: S1-S2 present, no added heart sounds  Respiratory: Chest clear bilaterally  Abdomen: Soft, nontender, +distended, bowel sounds present  Musculoskeletal: Bilateral BKA  Skin: Normal  Psychiatry: Flat affect, poor judgment   Data Reviewed: CBC: Recent Labs  Lab 10/12/17 0320 10/15/17 1248 10/16/17 1026  WBC 7.5 7.6 10.1  NEUTROABS 5.1 5.3 7.6  HGB 7.3* 6.2* 8.3*  HCT 22.8* 19.0* 25.1*  MCV 81.7 80.5 79.9  PLT 316 285 448   Basic Metabolic Panel: Recent Labs  Lab 10/15/17 1248 10/16/17 1026  NA 135 135  K 4.7 5.2*  CL 107 103  CO2 22 21*  GLUCOSE 178* 191*  BUN 38* 43*  CREATININE 1.70* 1.66*  CALCIUM 7.7* 8.1*   GFR: Estimated Creatinine Clearance: 43.3 mL/min (A) (by C-G formula based on SCr of 1.66 mg/dL (H)). Liver Function Tests: No results for input(s): AST, ALT, ALKPHOS, BILITOT, PROT, ALBUMIN in the last 168 hours. No results for input(s): LIPASE, AMYLASE in the last 168 hours. No results for input(s): AMMONIA in the last 168 hours. Coagulation Profile: No results for input(s): INR, PROTIME in the last 168 hours. Cardiac Enzymes: No results for input(s): CKTOTAL, CKMB, CKMBINDEX, TROPONINI in the last 168 hours. BNP (last 3 results) No results for input(s): PROBNP in the last 8760 hours. HbA1C: No results for input(s): HGBA1C in the last 72 hours. CBG: Recent Labs  Lab 10/15/17 0758 10/15/17 1709 10/15/17 2048 10/16/17 0812 10/16/17 1237  GLUCAP 90 219* 245* 151* 190*   Lipid Profile: No results for input(s): CHOL, HDL, LDLCALC, TRIG, CHOLHDL, LDLDIRECT in the last 72 hours. Thyroid Function Tests: No results for input(s): TSH, T4TOTAL, FREET4, T3FREE, THYROIDAB in the last 72  hours. Anemia Panel: No results for input(s): VITAMINB12, FOLATE, FERRITIN, TIBC, IRON, RETICCTPCT in the last 72 hours. Urine analysis: No results found for: COLORURINE, APPEARANCEUR, LABSPEC, PHURINE, GLUCOSEU, HGBUR, BILIRUBINUR, KETONESUR, PROTEINUR, UROBILINOGEN, NITRITE, LEUKOCYTESUR Sepsis Labs: @LABRCNTIP (procalcitonin:4,lacticidven:4)  )No results found for this or any previous visit (from the past 240 hour(s)).    Studies: No results found.  Scheduled Meds: . amLODipine  10 mg Oral Daily  . bisacodyl  10 mg Rectal Daily  . carvedilol  12.5 mg Oral BID WC  . cloNIDine  0.2 mg Transdermal Weekly  . divalproex  125 mg Oral Q12H  . famotidine  20 mg Oral QHS  . feeding supplement (GLUCERNA SHAKE)  237 mL Oral TID BM  . finasteride  5 mg Oral Daily  . gabapentin  100 mg Oral QHS  . insulin aspart  0-9 Units Subcutaneous TID WC  . insulin glargine  15 Units Subcutaneous QHS  . isosorbide mononitrate  30 mg Oral Daily  . Melatonin  3 mg Oral QHS  . mirtazapine  15 mg Oral QHS  . senna-docusate  1 tablet Oral BID  . tamsulosin  0.4 mg Oral Daily    Continuous Infusions: . sodium chloride       LOS: 40 days     Domenic Polite, MD [facility] Page via [email or web address], password [email or web address]  If 7PM-7AM, please contact night-coverage [email or web address] Password [email or web address] 10/16/2017, 1:31 PM

## 2017-10-17 LAB — BASIC METABOLIC PANEL WITH GFR
Anion gap: 10 (ref 5–15)
BUN: 43 mg/dL — ABNORMAL HIGH (ref 6–20)
CO2: 21 mmol/L — ABNORMAL LOW (ref 22–32)
Calcium: 8.3 mg/dL — ABNORMAL LOW (ref 8.9–10.3)
Chloride: 108 mmol/L (ref 101–111)
Creatinine, Ser: 1.62 mg/dL — ABNORMAL HIGH (ref 0.61–1.24)
GFR calc Af Amer: 46 mL/min — ABNORMAL LOW
GFR calc non Af Amer: 39 mL/min — ABNORMAL LOW
Glucose, Bld: 123 mg/dL — ABNORMAL HIGH (ref 65–99)
Potassium: 4.7 mmol/L (ref 3.5–5.1)
Sodium: 139 mmol/L (ref 135–145)

## 2017-10-17 LAB — GLUCOSE, CAPILLARY
GLUCOSE-CAPILLARY: 204 mg/dL — AB (ref 65–99)
Glucose-Capillary: 183 mg/dL — ABNORMAL HIGH (ref 65–99)
Glucose-Capillary: 184 mg/dL — ABNORMAL HIGH (ref 65–99)
Glucose-Capillary: 96 mg/dL (ref 65–99)

## 2017-10-17 NOTE — Progress Notes (Signed)
PROGRESS NOTE  Shawn Pearson DZH:[id number] DOB: 1941-03-30 DOA: 09/05/2017 PCP: [name], NP  HPI/Recap of past 67 hours: 76 year old male,nursing home resident,who presented with chest pain and uncontrolled hypertension. Patient is known to have DM, CAD, dementia, chronic kidney disease, hypertension, stage III rectal CA, severe PVD with bilateral LE amputation. Apparently patient hadbeen refusing his medications at the skilled nursing facility. Patient was admitted to the hospital working diagnosis uncontrolled hypertension. While in hospital reported to be suicidal and homicidal.He was seen by psychiatry, recommend IV Depakote,and inpatient geropsychiatric facility. While on this admission, developed rectal bleeding 12/15-diagnosed with adenocarcinoma 02/2017 at time of colonoscopy and previous GI bleed 03/2017, underwent sigmoidoscopy with APC ablation to the bleeding tumor +/- radiation therapy. GI was consulted during this admission, felt there was nothing different they would offer.  Today, pt refusing to answer questions. Abdomen noted to be distended, abdominal Xray showed large amount of stool throughout colon. Pt noted to have an episode of bloody BM with multiple clots noted. Patient's management severely hampered by patient refusing care and sometimes being aggressive/abusive to staff. Awaiting placement to geripsychiatry facility.   Assessment/Plan:  Stage III rectal adenocarcinoma  -Originally diagnosed in 02/2017 and reportedly treated with some radiation then  -Continues to have intermittent rectal bleeding, back in June underwent sigmoidoscopy and APC ablation  -Seen by gastroenterology this admission, poor candidate for further treatments due to vascular dementia and significant psychiatric issues with agitation and refusal of care  -Has been seen by palliative care this admission and including family meeting with palliative medicine, unfortunately family is not  actively involved  Acute on chronic blood loss anemia -Status post multiple units of PRBC this admission, , 1 unit on 12/15, and 2 units of PRBC on 12/27 -Repeat CBC pending this morning  Constipation -Last KUB with increased stool burden, multiple BMs yesterday after laxatives, hold off on an MRI due to risk of perforation with rectal CA, -Continue current laxatives repeat KUB in 2-3 days  #Dementia,with suicidal/homicidal ideation Continuesitter one to one plussuicidal precautions. Ondivaloprexandmirtazapine, melatonin -Seen by psych M.D. this admission, waiting for placement at psychiatric facility -Now DO NOT RESUSCITATE  Hypertension -By mouth med intake continues to be an unreliable -Continue amlodipine 10, carvedilol 12.5 bd, clonidine changed to patch 12/18, isosorbide 20 od if willing to take meds  Combined systolic/diastolic CHF Stable with no clinical signs of exacerbation ECHO showed LVEF 40-45%, Grade 2 DD, hypokinesis of the inf wall -Euvolemic, monitor -resume lasix in 1-2days  Esophagitis Continue withranitidine  Type 2 diabetes mellitus Continue basal insulin lantus 15 U and sliding scale -Stable now  Chronic kidney disease stage III Stable, avoid nephrotoxic medications.  Code Status: DNR Family Communication: None at bedside  Disposition Plan: Awaiting placement at [facility]   Consultants:  GI  Psychiatry  Procedures:  None  Antimicrobials:  None  DVT prophylaxis:  SCDs   Objective: Vitals:   10/15/17 2356 10/16/17 0440 10/16/17 1327 10/16/17 2050  BP: (!) 179/73 (!) 189/75 (!) 160/55 (!) 153/60  Pulse: 74 71 74 73  Resp: 20 20 19  (!) 21  Temp: 98 F (36.7 C)  98 F (36.7 C) 98.2 F (36.8 C)  TempSrc: Oral Oral Oral   SpO2: 98% 94% 98% 91%  Weight:      Height:        Intake/Output Summary (Last 24 hours) at 10/17/2017 1156 Last data filed at 10/17/2017 0942 Gross per 24 hour  Intake 822 ml  Output 1200 ml  Net -378 ml   Filed Weights   09/20/17 1056 09/21/17 0700 09/22/17 0445  Weight: 94.8 kg (208 lb 14.4 oz) 96 kg (211 lb 10.3 oz) 95.6 kg (210 lb 12.2 oz)    Exam:  Gen: Awake, Alert, pleasant, in no distress, oriented to self and place only HEENT: PERRLA, Neck supple, no JVD Lungs: Good air movement bilaterally, CTAB CVS: RRR,No Gallops,Rubs or new Murmurs Abd: soft, Non tender, non distended, BS present Extremities: Bilateral BKA Skin: no new rashes Psych: Flat affect, poor judgment    Data Reviewed: CBC: Recent Labs  Lab 10/12/17 0320 10/15/17 1248 10/16/17 1026  WBC 7.5 7.6 10.1  NEUTROABS 5.1 5.3 7.6  HGB 7.3* 6.2* 8.3*  HCT 22.8* 19.0* 25.1*  MCV 81.7 80.5 79.9  PLT 316 285 098   Basic Metabolic Panel: Recent Labs  Lab 10/15/17 1248 10/16/17 1026  NA 135 135  K 4.7 5.2*  CL 107 103  CO2 22 21*  GLUCOSE 178* 191*  BUN 38* 43*  CREATININE 1.70* 1.66*  CALCIUM 7.7* 8.1*   GFR: Estimated Creatinine Clearance: 43.3 mL/min (A) (by C-G formula based on SCr of 1.66 mg/dL (H)). Liver Function Tests: No results for input(s): AST, ALT, ALKPHOS, BILITOT, PROT, ALBUMIN in the last 168 hours. No results for input(s): LIPASE, AMYLASE in the last 168 hours. No results for input(s): AMMONIA in the last 168 hours. Coagulation Profile: No results for input(s): INR, PROTIME in the last 168 hours. Cardiac Enzymes: No results for input(s): CKTOTAL, CKMB, CKMBINDEX, TROPONINI in the last 168 hours. BNP (last 3 results) No results for input(s): PROBNP in the last 8760 hours. HbA1C: No results for input(s): HGBA1C in the last 72 hours. CBG: Recent Labs  Lab 10/16/17 0812 10/16/17 1237 10/16/17 1631 10/16/17 2048 10/17/17 0803  GLUCAP 151* 190* 235* 245* 183*   Lipid Profile: No results for input(s): CHOL, HDL, LDLCALC, TRIG, CHOLHDL, LDLDIRECT in the last 72 hours. Thyroid Function Tests: No results for input(s): TSH, T4TOTAL, FREET4, T3FREE,  THYROIDAB in the last 72 hours. Anemia Panel: No results for input(s): VITAMINB12, FOLATE, FERRITIN, TIBC, IRON, RETICCTPCT in the last 72 hours. Urine analysis: No results found for: COLORURINE, APPEARANCEUR, LABSPEC, PHURINE, GLUCOSEU, HGBUR, BILIRUBINUR, KETONESUR, PROTEINUR, UROBILINOGEN, NITRITE, LEUKOCYTESUR Sepsis Labs: @LABRCNTIP (procalcitonin:4,lacticidven:4)  )No results found for this or any previous visit (from the past 240 hour(s)).    Studies: No results found.  Scheduled Meds: . amLODipine  10 mg Oral Daily  . bisacodyl  10 mg Rectal Daily  . carvedilol  12.5 mg Oral BID WC  . cloNIDine  0.2 mg Transdermal Weekly  . divalproex  125 mg Oral Q12H  . famotidine  20 mg Oral QHS  . feeding supplement (GLUCERNA SHAKE)  237 mL Oral TID BM  . finasteride  5 mg Oral Daily  . gabapentin  100 mg Oral QHS  . insulin aspart  0-9 Units Subcutaneous TID WC  . insulin glargine  15 Units Subcutaneous QHS  . isosorbide mononitrate  30 mg Oral Daily  . Melatonin  3 mg Oral QHS  . mirtazapine  15 mg Oral QHS  . senna-docusate  1 tablet Oral BID  . tamsulosin  0.4 mg Oral Daily    Continuous Infusions:    LOS: 41 days     Domenic Polite, MD [facility] Page via [email or web address], password [email or web address]  If 7PM-7AM, please contact night-coverage [email or web address] Password [email or web address] 10/17/2017, 11:56 AM

## 2017-10-18 DIAGNOSIS — R140 Abdominal distension (gaseous): Secondary | ICD-10-CM

## 2017-10-18 LAB — CBC WITH DIFFERENTIAL/PLATELET
BASOS ABS: 0.1 10*3/uL (ref 0.0–0.1)
Basophils Relative: 1 %
EOS PCT: 3 %
Eosinophils Absolute: 0.3 10*3/uL (ref 0.0–0.7)
HEMATOCRIT: 24.2 % — AB (ref 39.0–52.0)
HEMOGLOBIN: 7.8 g/dL — AB (ref 13.0–17.0)
LYMPHS ABS: 1 10*3/uL (ref 0.7–4.0)
LYMPHS PCT: 11 %
MCH: 26.3 pg (ref 26.0–34.0)
MCHC: 32.2 g/dL (ref 30.0–36.0)
MCV: 81.5 fL (ref 78.0–100.0)
MONOS PCT: 12 %
Monocytes Absolute: 1.1 10*3/uL — ABNORMAL HIGH (ref 0.1–1.0)
NEUTROS PCT: 73 %
Neutro Abs: 6.9 10*3/uL (ref 1.7–7.7)
Platelets: 276 10*3/uL (ref 150–400)
RBC: 2.97 MIL/uL — AB (ref 4.22–5.81)
RDW: 17.1 % — ABNORMAL HIGH (ref 11.5–15.5)
WBC: 9.4 10*3/uL (ref 4.0–10.5)

## 2017-10-18 LAB — GLUCOSE, CAPILLARY
GLUCOSE-CAPILLARY: 129 mg/dL — AB (ref 65–99)
GLUCOSE-CAPILLARY: 155 mg/dL — AB (ref 65–99)
GLUCOSE-CAPILLARY: 159 mg/dL — AB (ref 65–99)
Glucose-Capillary: 91 mg/dL (ref 65–99)

## 2017-10-18 NOTE — Progress Notes (Signed)
PROGRESS NOTE  Shawn Pearson LOV:[id number] DOB: 1941/02/24 DOA: 09/05/2017 PCP: [name], NP  HPI/Recap of past 65 hours: 76 year old male,nursing home resident,who presented with chest pain and uncontrolled hypertension. Patient is known to have DM, CAD, dementia, chronic kidney disease, hypertension, stage III rectal CA, severe PVD with bilateral LE amputation. Apparently patient hadbeen refusing his medications at the skilled nursing facility. Patient was admitted to the hospital working diagnosis uncontrolled hypertension. While in hospital reported to be suicidal and homicidal.He was seen by psychiatry, recommend IV Depakote,and inpatient geropsychiatric facility. While on this admission, developed rectal bleeding 12/15-diagnosed with adenocarcinoma 02/2017 at time of colonoscopy and previous GI bleed 03/2017, underwent sigmoidoscopy with APC ablation to the bleeding tumor +/- radiation therapy. GI was consulted during this admission, felt there was nothing different they would offer. Patient's management severely hampered by patient refusing care and sometimes being aggressive/abusive to staff. Awaiting placement to geripsychiatry facility.   Subjective: -continues to have intermittent bleeding, no other issues  Assessment/Plan:  Stage III rectal adenocarcinoma  -Originally diagnosed in 02/2017 and reportedly treated with some radiation then  -Continues to have intermittent rectal bleeding, back in June underwent sigmoidoscopy and APC ablation  -Seen by gastroenterology this admission, poor candidate for further treatments due to vascular dementia and significant psychiatric issues with agitation and refusal of care  -Has been seen by palliative care this admission and including family meeting with palliative medicine, unfortunately family is not actively involved -will attempt to contact family on Monday  Acute on chronic blood loss anemia -Status post multiple units of PRBC  this admission, , 1 unit on 12/15, and 2 units of PRBC on 12/27 -Hb down to 7.8 -monitor periodically  Constipation -Last KUB with increased stool burden, multiple BMs yesterday after laxatives, hold off on an MRI due to risk of perforation with rectal CA, -Continue current laxatives   #Dementia,with suicidal/homicidal ideation Continuesitter one to one plussuicidal precautions. Ondivaloprexandmirtazapine, melatonin -Seen by psych M.D. this admission, waiting for placement at psychiatric facility -Now DO NOT RESUSCITATE  Hypertension -By mouth med intake continues to be an unreliable -Continue amlodipine 10, carvedilol 12.5 bd, clonidine changed to patch 12/18, isosorbide 20 od if willing to take meds  Combined systolic/diastolic CHF Stable with no clinical signs of exacerbation ECHO showed LVEF 40-45%, Grade 2 DD, hypokinesis of the inf wall -Euvolemic, monitor -resume lasix in 1-2days  Esophagitis Continue withranitidine  Type 2 diabetes mellitus Continue basal insulin lantus 15 U and sliding scale -Stable now  Chronic kidney disease stage III Stable, avoid nephrotoxic medications.  Code Status: DNR Family Communication: None at bedside  Disposition Plan: Awaiting placement at [facility]   Consultants:  GI  Psychiatry  Procedures:  None  Antimicrobials:  None  DVT prophylaxis:  SCDs   Objective: Vitals:   10/17/17 2120 10/18/17 0200 10/18/17 0500 10/18/17 0629  BP: (!) 180/66 (!) 170/61 (!) 177/89 (!) 159/67  Pulse: 67   72  Resp: (!) 22     Temp: 97.7 F (36.5 C)     TempSrc:      SpO2: 97%   95%  Weight:      Height:        Intake/Output Summary (Last 24 hours) at 10/18/2017 1240 Last data filed at 10/18/2017 0927 Gross per 24 hour  Intake 1038 ml  Output 140 ml  Net 898 ml   Filed Weights   09/20/17 1056 09/21/17 0700 09/22/17 0445  Weight: 94.8 kg (208 lb 14.4 oz)  96 kg (211 lb 10.3 oz) 95.6 kg (210 lb  12.2 oz)    Exam:  Gen: Awake, Alert, Oriented X to self and place only  HEENT: PERRLA, Neck supple, no JVD Lungs: Good air movement bilaterally, CTAB CVS: RRR,No Gallops,Rubs or new Murmurs Abd: soft, Non tender, non distended, BS present Extremities: bilateral BKA Skin: no new rashesHEENT: PERRLA, Neck supple, no JVD Psych: Flat affect, poor judgment    Data Reviewed: CBC: Recent Labs  Lab 10/12/17 0320 10/15/17 1248 10/16/17 1026 10/18/17 0516  WBC 7.5 7.6 10.1 9.4  NEUTROABS 5.1 5.3 7.6 6.9  HGB 7.3* 6.2* 8.3* 7.8*  HCT 22.8* 19.0* 25.1* 24.2*  MCV 81.7 80.5 79.9 81.5  PLT 316 285 303 478   Basic Metabolic Panel: Recent Labs  Lab 10/15/17 1248 10/16/17 1026 10/17/17 1836  NA 135 135 139  K 4.7 5.2* 4.7  CL 107 103 108  CO2 22 21* 21*  GLUCOSE 178* 191* 123*  BUN 38* 43* 43*  CREATININE 1.70* 1.66* 1.62*  CALCIUM 7.7* 8.1* 8.3*   GFR: Estimated Creatinine Clearance: 44.4 mL/min (A) (by C-G formula based on SCr of 1.62 mg/dL (H)). Liver Function Tests: No results for input(s): AST, ALT, ALKPHOS, BILITOT, PROT, ALBUMIN in the last 168 hours. No results for input(s): LIPASE, AMYLASE in the last 168 hours. No results for input(s): AMMONIA in the last 168 hours. Coagulation Profile: No results for input(s): INR, PROTIME in the last 168 hours. Cardiac Enzymes: No results for input(s): CKTOTAL, CKMB, CKMBINDEX, TROPONINI in the last 168 hours. BNP (last 3 results) No results for input(s): PROBNP in the last 8760 hours. HbA1C: No results for input(s): HGBA1C in the last 72 hours. CBG: Recent Labs  Lab 10/17/17 0803 10/17/17 1213 10/17/17 1710 10/17/17 2119 10/18/17 0809  GLUCAP 183* 184* 96 204* 91   Lipid Profile: No results for input(s): CHOL, HDL, LDLCALC, TRIG, CHOLHDL, LDLDIRECT in the last 72 hours. Thyroid Function Tests: No results for input(s): TSH, T4TOTAL, FREET4, T3FREE, THYROIDAB in the last 72 hours. Anemia Panel: No results for  input(s): VITAMINB12, FOLATE, FERRITIN, TIBC, IRON, RETICCTPCT in the last 72 hours. Urine analysis: No results found for: COLORURINE, APPEARANCEUR, LABSPEC, PHURINE, GLUCOSEU, HGBUR, BILIRUBINUR, KETONESUR, PROTEINUR, UROBILINOGEN, NITRITE, LEUKOCYTESUR Sepsis Labs: @LABRCNTIP (procalcitonin:4,lacticidven:4)  )No results found for this or any previous visit (from the past 240 hour(s)).    Studies: No results found.  Scheduled Meds: . amLODipine  10 mg Oral Daily  . bisacodyl  10 mg Rectal Daily  . carvedilol  12.5 mg Oral BID WC  . cloNIDine  0.2 mg Transdermal Weekly  . divalproex  125 mg Oral Q12H  . famotidine  20 mg Oral QHS  . feeding supplement (GLUCERNA SHAKE)  237 mL Oral TID BM  . finasteride  5 mg Oral Daily  . gabapentin  100 mg Oral QHS  . insulin aspart  0-9 Units Subcutaneous TID WC  . insulin glargine  15 Units Subcutaneous QHS  . isosorbide mononitrate  30 mg Oral Daily  . Melatonin  3 mg Oral QHS  . mirtazapine  15 mg Oral QHS  . senna-docusate  1 tablet Oral BID  . tamsulosin  0.4 mg Oral Daily    Continuous Infusions:    LOS: 42 days     Domenic Polite, MD [facility] Page via [email or web address], password [email or web address]  If 7PM-7AM, please contact night-coverage [email or web address] Password [email or web address] 10/18/2017, 12:40 PM

## 2017-10-18 NOTE — Progress Notes (Signed)
CRITICAL VALUE ALERT  Critical Value: Hbg -7.8  Date & Time Notied:  12/30/ 2018 7.00 am  Provider Notified: Domenic Polite  Orders Received/Actions taken:  Informed to doctor

## 2017-10-19 LAB — CBC
HEMATOCRIT: 24.6 % — AB (ref 39.0–52.0)
HEMOGLOBIN: 7.8 g/dL — AB (ref 13.0–17.0)
MCH: 26.2 pg (ref 26.0–34.0)
MCHC: 31.7 g/dL (ref 30.0–36.0)
MCV: 82.6 fL (ref 78.0–100.0)
Platelets: 293 10*3/uL (ref 150–400)
RBC: 2.98 MIL/uL — AB (ref 4.22–5.81)
RDW: 17 % — ABNORMAL HIGH (ref 11.5–15.5)
WBC: 8.2 10*3/uL (ref 4.0–10.5)

## 2017-10-19 LAB — GLUCOSE, CAPILLARY
GLUCOSE-CAPILLARY: 231 mg/dL — AB (ref 65–99)
GLUCOSE-CAPILLARY: 183 mg/dL — AB (ref 65–99)
GLUCOSE-CAPILLARY: 163 mg/dL — AB (ref 65–99)
Glucose-Capillary: 73 mg/dL (ref 65–99)

## 2017-10-19 NOTE — Progress Notes (Addendum)
Pt seen, no changes, at this time No family, attempted to reach family/wife for Goals of care discussion x2 today, didn't pick up phone and unable to leave message   Domenic Polite, MD

## 2017-10-20 LAB — GLUCOSE, CAPILLARY
GLUCOSE-CAPILLARY: 90 mg/dL (ref 65–99)
Glucose-Capillary: 133 mg/dL — ABNORMAL HIGH (ref 65–99)
Glucose-Capillary: 101 mg/dL — ABNORMAL HIGH (ref 65–99)
Glucose-Capillary: 114 mg/dL — ABNORMAL HIGH (ref 65–99)

## 2017-10-20 NOTE — Progress Notes (Signed)
PROGRESS NOTE  Shawn Pearson RKY:[id number] DOB: December 19, 1940 DOA: 09/05/2017 PCP: [name], NP  HPI/Recap of past 75 hours: 77 year old male,nursing home resident,who presented with chest pain and uncontrolled hypertension. Patient is known to have DM, CAD, dementia, chronic kidney disease, hypertension, stage III rectal CA, severe PVD with bilateral LE amputation. Apparently patient hadbeen refusing his medications at the skilled nursing facility. Patient was admitted to the hospital working diagnosis uncontrolled hypertension. While in hospital reported to be suicidal and homicidal.He was seen by psychiatry, recommend IV Depakote,and inpatient geropsychiatric facility. While on this admission, developed rectal bleeding 12/15-diagnosed with adenocarcinoma 02/2017 at time of colonoscopy and previous GI bleed 03/2017, underwent sigmoidoscopy with APC ablation to the bleeding tumor +/- radiation therapy. GI was consulted during this admission, felt there was nothing different they would offer. Patient's management severely hampered by patient refusing care and sometimes being aggressive/abusive to staff. Awaiting placement to geripsychiatry facility.   Subjective: -continues to have intermittent bleeding, no other issues  Assessment/Plan:  Stage III rectal adenocarcinoma  -Originally diagnosed in 02/2017 and reportedly treated with some radiation then  -Continues to have intermittent rectal bleeding, back in June underwent sigmoidoscopy and APC ablation  -Seen by gastroenterology this admission, poor candidate for further treatments due to vascular dementia and significant psychiatric issues with agitation and refusal of care  -Has been seen by palliative care this admission and including family meeting with palliative medicine, unfortunately family is not actively involved -I have been unable to contact his family despite 4 attempts in the last 2 days will try again tomorrow  Acute on  chronic blood loss anemia -Status post multiple units of PRBC this admission, , 1 unit on 12/15, and 2 units of PRBC on 12/27 -Hb down to 7.8 -monitor periodically  Constipation -Last KUB with increased stool burden, multiple BMs yesterday after laxatives, hold off on an MRI due to risk of perforation with rectal CA, -Continue current laxatives   #Dementia,with suicidal/homicidal ideation Continuesitter one to one plussuicidal precautions. Ondivaloprexandmirtazapine, melatonin -Seen by psych M.D. this admission, waiting for placement at psychiatric facility -Now DO NOT RESUSCITATE -Will ask psychiatry to reassess  Hypertension -By mouth med intake continues to be an unreliable -Continue amlodipine 10, carvedilol 12.5 bd, clonidine changed to patch 12/18,  -DC Imdur  Combined systolic/diastolic CHF Stable with no clinical signs of exacerbation ECHO showed LVEF 40-45%, Grade 2 DD, hypokinesis of the inf wall -Euvolemic, monitor -Hold off on Lasix at this time  Esophagitis Continue withranitidine  Type 2 diabetes mellitus Continue basal insulin lantus 15 U and sliding scale -Stable now  Chronic kidney disease stage III Stable, avoid nephrotoxic medications.  Code Status: DNR Family Communication: None at bedside  Disposition Plan: Awaiting placement at [facility]   Consultants:  GI  Psychiatry  Procedures:  None  Antimicrobials:  None  DVT prophylaxis:  SCDs   Objective: Vitals:   10/18/17 2252 10/19/17 0630 10/19/17 1300 10/19/17 2040  BP: (!) 162/55 (!) 165/69 (!) 151/50 (!) 122/96  Pulse: 82 61 63 70  Resp:  18 16 20   Temp:  98.4 F (36.9 C) 98.2 F (36.8 C) 98.7 F (37.1 C)  TempSrc:  Oral Oral Axillary  SpO2:  100% 100% 100%  Weight:      Height:        Intake/Output Summary (Last 24 hours) at 10/20/2017 1038 Last data filed at 10/19/2017 1700 Gross per 24 hour  Intake 360 ml  Output -  Net 360 ml  Filed  Weights   09/20/17 1056 09/21/17 0700 09/22/17 0445  Weight: 94.8 kg (208 lb 14.4 oz) 96 kg (211 lb 10.3 oz) 95.6 kg (210 lb 12.2 oz)    Exam:  Gen: Awake, Alert, Oriented X 2, dysarthric HEENT: PERRLA, Neck supple, no JVD Lungs: Good air movement bilaterally, CTAB CVS: RRR,No Gallops,Rubs or new Murmurs Abd: soft, Non tender, non distended, BS present Extremities: Bilateral below-knee amputation Skin: no new rashes Psych: Flat affect, poor judgment    Data Reviewed: CBC: Recent Labs  Lab 10/15/17 1248 10/16/17 1026 10/18/17 0516 10/19/17 0606  WBC 7.6 10.1 9.4 8.2  NEUTROABS 5.3 7.6 6.9  --   HGB 6.2* 8.3* 7.8* 7.8*  HCT 19.0* 25.1* 24.2* 24.6*  MCV 80.5 79.9 81.5 82.6  PLT 285 303 276 811   Basic Metabolic Panel: Recent Labs  Lab 10/15/17 1248 10/16/17 1026 10/17/17 1836  NA 135 135 139  K 4.7 5.2* 4.7  CL 107 103 108  CO2 22 21* 21*  GLUCOSE 178* 191* 123*  BUN 38* 43* 43*  CREATININE 1.70* 1.66* 1.62*  CALCIUM 7.7* 8.1* 8.3*   GFR: Estimated Creatinine Clearance: 44.4 mL/min (A) (by C-G formula based on SCr of 1.62 mg/dL (H)). Liver Function Tests: No results for input(s): AST, ALT, ALKPHOS, BILITOT, PROT, ALBUMIN in the last 168 hours. No results for input(s): LIPASE, AMYLASE in the last 168 hours. No results for input(s): AMMONIA in the last 168 hours. Coagulation Profile: No results for input(s): INR, PROTIME in the last 168 hours. Cardiac Enzymes: No results for input(s): CKTOTAL, CKMB, CKMBINDEX, TROPONINI in the last 168 hours. BNP (last 3 results) No results for input(s): PROBNP in the last 8760 hours. HbA1C: No results for input(s): HGBA1C in the last 72 hours. CBG: Recent Labs  Lab 10/19/17 0752 10/19/17 1227 10/19/17 1700 10/19/17 2045 10/20/17 0758  GLUCAP 73 231* 183* 163* 133*   Lipid Profile: No results for input(s): CHOL, HDL, LDLCALC, TRIG, CHOLHDL, LDLDIRECT in the last 72 hours. Thyroid Function Tests: No results for  input(s): TSH, T4TOTAL, FREET4, T3FREE, THYROIDAB in the last 72 hours. Anemia Panel: No results for input(s): VITAMINB12, FOLATE, FERRITIN, TIBC, IRON, RETICCTPCT in the last 72 hours. Urine analysis: No results found for: COLORURINE, APPEARANCEUR, LABSPEC, PHURINE, GLUCOSEU, HGBUR, BILIRUBINUR, KETONESUR, PROTEINUR, UROBILINOGEN, NITRITE, LEUKOCYTESUR Sepsis Labs: @LABRCNTIP (procalcitonin:4,lacticidven:4)  )No results found for this or any previous visit (from the past 240 hour(s)).    Studies: No results found.  Scheduled Meds: . amLODipine  10 mg Oral Daily  . bisacodyl  10 mg Rectal Daily  . carvedilol  12.5 mg Oral BID WC  . cloNIDine  0.2 mg Transdermal Weekly  . divalproex  125 mg Oral Q12H  . famotidine  20 mg Oral QHS  . feeding supplement (GLUCERNA SHAKE)  237 mL Oral TID BM  . finasteride  5 mg Oral Daily  . gabapentin  100 mg Oral QHS  . insulin aspart  0-9 Units Subcutaneous TID WC  . insulin glargine  15 Units Subcutaneous QHS  . isosorbide mononitrate  30 mg Oral Daily  . Melatonin  3 mg Oral QHS  . mirtazapine  15 mg Oral QHS  . senna-docusate  1 tablet Oral BID  . tamsulosin  0.4 mg Oral Daily    Continuous Infusions:    LOS: 44 days     Domenic Polite, MD [facility] Page via [email or web address], password [email or web address]  If 7PM-7AM, please contact night-coverage [email or web address] Password [email or web address] 10/20/2017, 10:38 AM

## 2017-10-21 LAB — GLUCOSE, CAPILLARY: Glucose-Capillary: 156 mg/dL — ABNORMAL HIGH (ref 65–99)

## 2017-10-21 MED ORDER — FUROSEMIDE 40 MG PO TABS
40.0000 mg | ORAL_TABLET | Freq: Every day | ORAL | Status: DC
  Administered 2017-10-21 – 2017-10-22 (×2): 40 mg via ORAL
  Filled 2017-10-21 (×2): qty 1

## 2017-10-21 NOTE — Progress Notes (Signed)
At [place], pt was complaining of shortness of breath. When sitter, nurse tech, and RN attempted to check pt's pulse ox, patient became aggressive and combative and refused and said, "Don't touch me! Don't put that on my finger!" Then patient slammed the bed side table against the wall and threw his cups of water in the floor. Then pt stripped off his gown and bed covers and threw them in the floor. Pt then started calling out, "Give me a gown! Give me a gown!" A new gown was put on patient. RN attempted to check patient's pulse ox again, but he continued to refuse. Will continue to monitor and treat per MD orders.

## 2017-10-21 NOTE — Progress Notes (Signed)
Missed patient's wife call, I received a message from the secretary. The wife is requesting to know the correct spelling of [place]. Wife plans to come to the hospital Friday. I will call her back.

## 2017-10-21 NOTE — Consult Note (Signed)
[facility] Psych Consult Progress Note  10/21/2017 5:04 PM Shawn Pearson  MRN:  [id number] Subjective:   Shawn Pearson engaged in the interview today although only to request to speak to a Education officer, museum about his financial concerns. He inquired why the social worker was not coming to speak to him everyday. He was informed that he must request to speak to the social worker when he has issues that may be addressed by the Education officer, museum. He asked for an Ensure.   According to nursing, patient's behavior has significantly improved. He now has periodic periods of agitation. He has not exhibited physical aggression towards staff. He did push his bedside table against the wall yesterday and threw his cup on the floor. Primary team provider also reports that has been more calm. He continues to regularly take his routine medications.   Principal Problem: Dementia Diagnosis:   Patient Active Problem List   Diagnosis Date Noted  . Palliative care by specialist [Z51.5]   . DNR (do not resuscitate) [Z66]   . Malignant neoplasm of colon ([place]) [C18.9]   . Rectal bleeding [K62.5]   . Elevated troponin [R74.8]   . Agitation [R45.1]   . Dementia [F03.90] 09/06/2017  . Noncompliance with medication regimen [Z91.14] 09/06/2017  . Acute on chronic anemia [D64.9] 08/30/2017  . Hypertension [I10] 08/30/2017  . Hypertensive urgency [I16.0] 08/21/2017  . DM2 (diabetes mellitus, type 2) ([place]) [E11.9] 08/21/2017  . CKD stage 3 secondary to diabetes ([place]) [O17.51, N18.3] 08/21/2017  . Rectal cancer ([place]) [C20] 08/21/2017  . Hypertensive emergency [I16.1] 08/21/2017  . Chest pain [R07.9]    Total Time spent with patient: 15 minutes  Past Psychiatric History: Dementia   Past Medical History:  Past Medical History:  Diagnosis Date  . Aortic atherosclerosis ([place])   . Dementia   . Diabetes mellitus without complication ([place])   . History of angina   . Hypertension   . Renal disorder    CKD stage 3    Past Surgical  History:  Procedure Laterality Date  . BELOW KNEE LEG AMPUTATION Bilateral    Family History: History reviewed. No pertinent family history. Family Psychiatric  History: Unknown  Social History:  Social History   Substance and Sexual Activity  Alcohol Use No     Social History   Substance and Sexual Activity  Drug Use No    Social History   Socioeconomic History  . Marital status: Widowed    Spouse name: None  . Number of children: None  . Years of education: None  . Highest education level: None  Social Needs  . Financial resource strain: None  . Food insecurity - worry: None  . Food insecurity - inability: None  . Transportation needs - medical: None  . Transportation needs - non-medical: None  Occupational History  . None  Tobacco Use  . Smoking status: Never Smoker  . Smokeless tobacco: Never Used  Substance and Sexual Activity  . Alcohol use: No  . Drug use: No  . Sexual activity: No  Other Topics Concern  . None  Social History Narrative  . None    Sleep: Good  Appetite:  Poor  Current Medications: Current Facility-Administered Medications  Medication Dose Pearson Frequency Provider Last Rate Last Dose  . acetaminophen (TYLENOL) tablet 650 mg  650 mg Oral Q4H PRN Phillips Grout, MD   650 mg at 10/20/17 0336  . amLODipine (NORVASC) tablet 10 mg  10 mg Oral Daily Patrecia Pour, MD  10 mg at 10/21/17 0954  . bisacodyl (DULCOLAX) suppository 10 mg  10 mg Rectal Daily Alma Friendly, MD   10 mg at 10/20/17 0830  . carvedilol (COREG) tablet 12.5 mg  12.5 mg Oral BID WC Alphonzo Grieve, MD   12.5 mg at 10/21/17 0954  . cloNIDine (CATAPRES - Dosed in mg/24 hr) patch 0.2 mg  0.2 mg Transdermal Weekly Alma Friendly, MD   0.2 mg at 10/19/17 0809  . divalproex (DEPAKOTE SPRINKLE) capsule 125 mg  125 mg Oral Q12H Patrecia Pour, MD   125 mg at 10/21/17 0951  . famotidine (PEPCID) tablet 20 mg  20 mg Oral QHS Arrien, Jimmy Picket, MD   20 mg at  10/20/17 2105  . feeding supplement (GLUCERNA SHAKE) (GLUCERNA SHAKE) liquid 237 mL  237 mL Oral TID BM Nita Sells, MD   237 mL at 10/21/17 1226  . finasteride (PROSCAR) tablet 5 mg  5 mg Oral Daily Derrill Kay A, MD   5 mg at 10/21/17 0954  . furosemide (LASIX) tablet 40 mg  40 mg Oral Daily Domenic Polite, MD   40 mg at 10/21/17 0954  . gabapentin (NEURONTIN) capsule 100 mg  100 mg Oral QHS Patrecia Pour, MD   100 mg at 10/20/17 2105  . hydrALAZINE (APRESOLINE) injection 5 mg  5 mg Intravenous Q4H PRN Gardiner Barefoot, NP   5 mg at 10/18/17 2157  . insulin aspart (novoLOG) injection 0-9 Units  0-9 Units Subcutaneous TID WC Theodis Blaze, MD   2 Units at 10/21/17 1226  . insulin glargine (LANTUS) injection 15 Units  15 Units Subcutaneous QHS Georgette Shell, MD   15 Units at 10/20/17 2107  . ipratropium-albuterol (DUONEB) 0.5-2.5 (3) MG/3ML nebulizer solution 3 mL  3 mL Nebulization Q6H PRN Alma Friendly, MD   3 mL at 10/04/17 1211  . Melatonin TABS 3 mg  3 mg Oral QHS Regalado, Belkys A, MD   3 mg at 10/20/17 2106  . mirtazapine (REMERON) tablet 15 mg  15 mg Oral QHS Alma Friendly, MD   15 mg at 10/20/17 2105  . polyvinyl alcohol (LIQUIFILM TEARS) 1.4 % ophthalmic solution 1 drop  1 drop Both Eyes PRN Dana Allan I, MD   1 drop at 10/18/17 2203  . senna-docusate (Senokot-S) tablet 1 tablet  1 tablet Oral BID Alma Friendly, MD   1 tablet at 10/21/17 0950  . simethicone (MYLICON) chewable tablet 80 mg  80 mg Oral QID PRN Arrien, Jimmy Picket, MD   80 mg at 10/12/17 1004  . tamsulosin (FLOMAX) capsule 0.4 mg  0.4 mg Oral Daily Derrill Kay A, MD   0.4 mg at 10/21/17 0954  . traMADol (ULTRAM) tablet 50 mg  50 mg Oral Q12H PRN Nita Sells, MD   50 mg at 10/21/17 0950    Lab Results:  Results for orders placed or performed during the hospital encounter of 09/05/17 (from the past 48 hour(s))  Glucose, capillary     Status: Abnormal    Collection Time: 10/19/17  8:45 PM  Result Value Ref Range   Glucose-Capillary 163 (H) 65 - 99 mg/dL  Glucose, capillary     Status: Abnormal   Collection Time: 10/20/17  7:58 AM  Result Value Ref Range   Glucose-Capillary 133 (H) 65 - 99 mg/dL  Glucose, capillary     Status: None   Collection Time: 10/20/17 12:09 PM  Result Value Ref Range  Glucose-Capillary 90 65 - 99 mg/dL  Glucose, capillary     Status: Abnormal   Collection Time: 10/20/17  4:50 PM  Result Value Ref Range   Glucose-Capillary 101 (H) 65 - 99 mg/dL  Glucose, capillary     Status: Abnormal   Collection Time: 10/20/17  9:04 PM  Result Value Ref Range   Glucose-Capillary 114 (H) 65 - 99 mg/dL  Glucose, capillary     Status: Abnormal   Collection Time: 10/21/17 12:11 PM  Result Value Ref Range   Glucose-Capillary 156 (H) 65 - 99 mg/dL     Musculoskeletal: Strength & Muscle Tone:Decreased secondary to physical deconditioning. Gait & Station:He hasbilateral BKA. Patient leans:N/A    Psychiatric Specialty Exam: Physical Exam  Nursing note and vitals reviewed. Constitutional: He appears well-developed and well-nourished.  HENT:  Head: Normocephalic and atraumatic.  Neck: Normal range of motion.  Respiratory: Effort normal.  Musculoskeletal: Normal range of motion.  Neurological: He is alert.  Oriented to person.  Skin: No rash noted.  Psychiatric: His speech is normal and behavior is normal. Thought content normal. His affect is angry. Cognition and memory are impaired. He expresses impulsivity.    ROS  Blood pressure (!) 156/57, pulse 61, temperature 98.8 F (37.1 C), temperature source Oral, resp. rate 18, height 6\' 2"  (1.88 m), weight 101.7 kg (224 lb 3.3 oz), SpO2 100 %.Body mass index is 28.79 kg/m.  General Appearance: Fairly Groomed, elderly, African American male with bilateral BKA, wearing a hospital gown and lying in bed. NAD.  Eye Contact:  Good  Speech:  Clear and Coherent and Normal  Rate  Volume:  Increased  Mood:  Angry  Affect:  Congruent  Thought Process:  Linear  Orientation:  Other:  To person  Thought Content:  Logical  Suicidal Thoughts:  Did not endorse  Homicidal Thoughts:  Did not endorse  Memory:  Immediate;   Poor Recent;   Poor Remote;   Poor  Judgement:  Impaired  Insight:  Lacking  Psychomotor Activity:  Increased  Concentration:  Concentration: Fair and Attention Span: Fair  Recall:  Poor  Fund of Knowledge:  Poor  Language:  Fair  Akathisia:  No  Handed:  Right  AIMS (if indicated):   N/A  Assets:  Housing  ADL's:  Impaired  Cognition:  Impaired due to dementia.   Sleep:   Okay   Assessment:  Shawn Pearson is a 77 y.o. male who was admitted withchest pain in the setting of uncontrolled blood pressure secondary to poor medication compliance.He has been waiting for placement at [facility] for further management of agitation. He has episodic periods of agitation but he generally seems to be doing better since consistently taking his psychotropic medications. According to primary team patient has been doing relatively well with episodic periods of agitation which is not unusual in the setting of dementia. This appears to be at his baseline. He is consistently taking his medications and at this time it does not appear that inpatient psychiatric hospitalization is necessary. He may benefit from SNF placement to manage his medical needs and ADLs.   Treatment Plan Summary: -ContinueDepakote125 mg BIDfor agitation.Can titrate evening dose by 125 mg increments as clinically indicated for worsening agitation.  -Continue Remeron 15 mg qhs for irritability and appetitestimulation. -Avoid medications that can worsen confusion and agitation such as antihistamines, anticholingerics and benzodiazepines. Benzodiazpeines can alsocause disinhibition. -ContinueMelatonin3mg  qhs to regulate sleep/wake cycle. Encourage patient to stay awake in the daytime so  that he  may sleep at night. -Antipsychotic use is not advised given prolonged QTc and risk for arrhthymias (QTc 526 on 11/17 and decreased to 501 on 12/15). -Usesoft restraints as needed for imminent risk of harming self or others and if needed for emergent treatment. -Patient appears to be at his baseline. He no longer warrants inpatient psychiatric hospitalization and his episodic periods of agitation may be managed with medications and/or verbal deescalation. He may benefit from SNF placement to manage medical needs and ADLs.  -Psychiatry will sign off patient at this time. Please consult psychiatry again as needed.     Faythe Dingwall, DO 10/21/2017, 5:04 PM

## 2017-10-21 NOTE — Progress Notes (Signed)
Patient refused vitals for nurse tech and sitter.

## 2017-10-21 NOTE — Progress Notes (Signed)
Patient requested tylenol, he stated he was in pain once I went to give him the medicine he refused to take it and cursed me out. I disposed of the tylenol in the sharps container.

## 2017-10-21 NOTE — Care Management Important Message (Signed)
Important Message  Patient Details  Name: Shawn Pearson MRN: [id number] Date of Birth: Apr 03, 1941   Medicare Important Message Given:  Yes    Christpher Stogsdill Abena 10/21/2017, 10:18 AM

## 2017-10-21 NOTE — Progress Notes (Signed)
PROGRESS NOTE  Shawn Pearson KNL:[id number] DOB: 09/06/1941 DOA: 09/05/2017 PCP: [name], NP  HPI/Recap of past 71 hours: 77 year old male,nursing home resident,who presented with chest pain and uncontrolled hypertension. Patient is known to have DM, CAD, dementia, chronic kidney disease, hypertension, stage III rectal CA, severe PVD with bilateral LE amputation. Apparently patient hadbeen refusing his medications at the skilled nursing facility. Patient was admitted to the hospital working diagnosis uncontrolled hypertension. While in hospital reported to be suicidal and homicidal.He was seen by psychiatry, recommend IV Depakote,and inpatient geropsychiatric facility. While on this admission, developed rectal bleeding 12/15-diagnosed with adenocarcinoma 02/2017 at time of colonoscopy and previous GI bleed 03/2017, underwent sigmoidoscopy with APC ablation to the bleeding tumor +/- radiation therapy. GI was consulted during this admission, felt there was nothing different they would offer. Patient's management severely hampered by patient refusing care and sometimes being aggressive/abusive to staff.  Has been awaiting placement to geripsychiatry facility.  -Much more mellow now, having intermittent rectal bleeding, hemoglobin now stable, last transfusion more than 5 days ago  Subjective: -Feels okay wants to talk to Education officer, museum, had a bowel movement today but no bleeding per nurse tech  Assessment/Plan:  Stage III rectal adenocarcinoma  -Originally diagnosed in 02/2017 and reportedly treated with some radiation then  -Continues to have intermittent rectal bleeding, back in June underwent sigmoidoscopy and APC ablation  -Seen by gastroenterology this admission, poor candidate for further treatments due to vascular dementia and significant psychiatric issues with agitation and refusal of care  -Has been seen by palliative care this admission and including family meeting with  palliative medicine, unfortunately family is not actively involved -I have been unable to contact his family despite 5 attempts in the last 3 days, today finally I was able to talk to his wife Mickel Baas, I reiterated the importance of them coming to the hospital to have a family meeting to discuss goals of care -She tells me that they live more than 2 hours away, she is chronically herself and is dependent on her daughter for transportation she will discuss with daughter and reportedly get back to [place] soon  Acute on chronic blood loss anemia -Status post multiple units of PRBC this admission, , 1 unit on 12/15, and 2 units of PRBC on 12/27 -Hb now stable at 7.8 -monitor periodically  Constipation -Last KUB with increased stool burden, multiple BMs yesterday after laxatives, hold off on an MRI due to risk of perforation with rectal CA, -Continue current laxatives   #Dementia,with suicidal/homicidal ideation Continuesitter one to one plussuicidal precautions. Ondivaloprexandmirtazapine, melatonin -Seen by psych M.D. this admission, has been awaiting placement at psychiatric facility -Now DO NOT RESUSCITATE -Since patient is much more mellow and sober down now will ask psychiatry to reassess the need for inpatient psych and see if SNF would be appropriate  Hypertension -By mouth med intake continues to be an unreliable -Continue amlodipine 10, carvedilol 12.5 bd, clonidine changed to patch 12/18,  -DCed Imdur -Add low-dose Lasix PO  Combined systolic/diastolic CHF Stable with no clinical signs of exacerbation ECHO showed LVEF 40-45%, Grade 2 DD, hypokinesis of the inf wall -Appears slightly volume overloaded, we will restart oral Lasix today, check Bmet in am  Esophagitis Continue withranitidine  Type 2 diabetes mellitus Continue basal insulin lantus 15 U and sliding scale -Stable now  Chronic kidney disease stage III Stable, avoid nephrotoxic medications.  DVT proph:  SCDs  Code Status: DNR Family Communication: None at bedside, finally able to reach  wife this morning, see discussion above  Disposition Plan: Awaiting placement at [facility], asked PSy to re-assess if this is still needed   Consultants:  GI  Psychiatry  Procedures:  None  Antimicrobials:  None    Objective: Vitals:   10/20/17 1306 10/20/17 2200 10/21/17 0500 10/21/17 0954  BP: (!) 153/50 (!) 160/51  (!) 156/57  Pulse: 65 72  61  Resp: 16 18    Temp: (!) 97.5 F (36.4 C) 98.8 F (37.1 C)    TempSrc:  Oral    SpO2: 98% 100%    Weight:   101.7 kg (224 lb 3.3 oz)   Height:        Intake/Output Summary (Last 24 hours) at 10/21/2017 1235 Last data filed at 10/21/2017 1007 Gross per 24 hour  Intake 1657 ml  Output 500 ml  Net 1157 ml   Filed Weights   09/21/17 0700 09/22/17 0445 10/21/17 0500  Weight: 96 kg (211 lb 10.3 oz) 95.6 kg (210 lb 12.2 oz) 101.7 kg (224 lb 3.3 oz)    Exam:  Gen: Awake, Alert, oriented to self and partially to place only, dysarthric HEENT: PERRLA, Neck supple, no JVD Lungs: Diminished breath sounds to bases  CVS: RRR,No Gallops,Rubs or new Murmurs Abd: soft, Non tender, non distended, BS present Extremities: trace edema, bilateral below-knee amputations Skin: no new rashes Psych: Flat affect, poor judgment    Data Reviewed: CBC: Recent Labs  Lab 10/15/17 1248 10/16/17 1026 10/18/17 0516 10/19/17 0606  WBC 7.6 10.1 9.4 8.2  NEUTROABS 5.3 7.6 6.9  --   HGB 6.2* 8.3* 7.8* 7.8*  HCT 19.0* 25.1* 24.2* 24.6*  MCV 80.5 79.9 81.5 82.6  PLT 285 303 276 443   Basic Metabolic Panel: Recent Labs  Lab 10/15/17 1248 10/16/17 1026 10/17/17 1836  NA 135 135 139  K 4.7 5.2* 4.7  CL 107 103 108  CO2 22 21* 21*  GLUCOSE 178* 191* 123*  BUN 38* 43* 43*  CREATININE 1.70* 1.66* 1.62*  CALCIUM 7.7* 8.1* 8.3*   GFR: Estimated Creatinine Clearance: 48.6 mL/min (A) (by C-G formula based on SCr of 1.62 mg/dL (H)). Liver  Function Tests: No results for input(s): AST, ALT, ALKPHOS, BILITOT, PROT, ALBUMIN in the last 168 hours. No results for input(s): LIPASE, AMYLASE in the last 168 hours. No results for input(s): AMMONIA in the last 168 hours. Coagulation Profile: No results for input(s): INR, PROTIME in the last 168 hours. Cardiac Enzymes: No results for input(s): CKTOTAL, CKMB, CKMBINDEX, TROPONINI in the last 168 hours. BNP (last 3 results) No results for input(s): PROBNP in the last 8760 hours. HbA1C: No results for input(s): HGBA1C in the last 72 hours. CBG: Recent Labs  Lab 10/20/17 0758 10/20/17 1209 10/20/17 1650 10/20/17 2104 10/21/17 1211  GLUCAP 133* 90 101* 114* 156*   Lipid Profile: No results for input(s): CHOL, HDL, LDLCALC, TRIG, CHOLHDL, LDLDIRECT in the last 72 hours. Thyroid Function Tests: No results for input(s): TSH, T4TOTAL, FREET4, T3FREE, THYROIDAB in the last 72 hours. Anemia Panel: No results for input(s): VITAMINB12, FOLATE, FERRITIN, TIBC, IRON, RETICCTPCT in the last 72 hours. Urine analysis: No results found for: COLORURINE, APPEARANCEUR, LABSPEC, PHURINE, GLUCOSEU, HGBUR, BILIRUBINUR, KETONESUR, PROTEINUR, UROBILINOGEN, NITRITE, LEUKOCYTESUR Sepsis Labs: @LABRCNTIP (procalcitonin:4,lacticidven:4)  )No results found for this or any previous visit (from the past 240 hour(s)).    Studies: No results found.  Scheduled Meds: . amLODipine  10 mg Oral Daily  . bisacodyl  10 mg Rectal Daily  .  carvedilol  12.5 mg Oral BID WC  . cloNIDine  0.2 mg Transdermal Weekly  . divalproex  125 mg Oral Q12H  . famotidine  20 mg Oral QHS  . feeding supplement (GLUCERNA SHAKE)  237 mL Oral TID BM  . finasteride  5 mg Oral Daily  . furosemide  40 mg Oral Daily  . gabapentin  100 mg Oral QHS  . insulin aspart  0-9 Units Subcutaneous TID WC  . insulin glargine  15 Units Subcutaneous QHS  . Melatonin  3 mg Oral QHS  . mirtazapine  15 mg Oral QHS  . senna-docusate  1 tablet  Oral BID  . tamsulosin  0.4 mg Oral Daily    Continuous Infusions:    LOS: 45 days     Domenic Polite, MD [facility] Page via [email or web address], password [email or web address]  If 7PM-7AM, please contact night-coverage [email or web address] Password [facility] 10/21/2017, 12:35 PM

## 2017-10-21 NOTE — Progress Notes (Signed)
MD verbal order for social work consult. Possible SNF placement.

## 2017-10-21 NOTE — Progress Notes (Signed)
Attempted to call back patient's wife however she did not answer. Her phone number is [phone].

## 2017-10-21 NOTE — Progress Notes (Signed)
CSW confirmed that patient is still on [facility] waitlist.  [name] [facility] [phone]

## 2017-10-21 NOTE — Progress Notes (Signed)
Patient refused vital signs and refused carvedilol.

## 2017-10-21 NOTE — Progress Notes (Signed)
Patient refused CBG check this morning and received patient teaching on the importance of checking his blood sugar levels.

## 2017-10-21 NOTE — Progress Notes (Signed)
Patient refused CBG check for the NT sitter and I. Patient has been educated.

## 2017-10-21 NOTE — Progress Notes (Signed)
Pt verbally aggressive with phlebotomy staff and refusing for his blood to be drawn this morning. Will continue to monitor and treat per MD orders.

## 2017-10-22 LAB — BASIC METABOLIC PANEL
ANION GAP: 6 (ref 5–15)
BUN: 40 mg/dL — AB (ref 6–20)
CALCIUM: 7.7 mg/dL — AB (ref 8.9–10.3)
CO2: 25 mmol/L (ref 22–32)
Chloride: 104 mmol/L (ref 101–111)
Creatinine, Ser: 1.75 mg/dL — ABNORMAL HIGH (ref 0.61–1.24)
GFR calc Af Amer: 42 mL/min — ABNORMAL LOW (ref >60)
GFR, EST NON AFRICAN AMERICAN: 36 mL/min — AB (ref >60)
GLUCOSE: 111 mg/dL — AB (ref 65–99)
Potassium: 4.5 mmol/L (ref 3.5–5.1)
Sodium: 135 mmol/L (ref 135–145)

## 2017-10-22 LAB — GLUCOSE, CAPILLARY
GLUCOSE-CAPILLARY: 220 mg/dL — AB (ref 65–99)
Glucose-Capillary: 133 mg/dL — ABNORMAL HIGH (ref 65–99)
Glucose-Capillary: 72 mg/dL (ref 65–99)
Glucose-Capillary: 183 mg/dL — ABNORMAL HIGH (ref 65–99)

## 2017-10-22 MED ORDER — MENTHOL 3 MG MT LOZG
1.0000 | LOZENGE | OROMUCOSAL | Status: DC | PRN
  Administered 2017-10-22 – 2017-10-30 (×2): 3 mg via ORAL
  Filled 2017-10-22 (×3): qty 9

## 2017-10-22 MED ORDER — FUROSEMIDE 40 MG PO TABS
60.0000 mg | ORAL_TABLET | Freq: Every day | ORAL | Status: DC
  Administered 2017-10-24 – 2017-11-06 (×14): 60 mg via ORAL
  Filled 2017-10-22 (×14): qty 1

## 2017-10-22 MED ORDER — LOSARTAN POTASSIUM 50 MG PO TABS
50.0000 mg | ORAL_TABLET | Freq: Every day | ORAL | Status: DC
  Administered 2017-10-24 – 2017-11-05 (×13): 50 mg via ORAL
  Filled 2017-10-22 (×13): qty 1

## 2017-10-22 NOTE — Plan of Care (Signed)
  Health Behavior/Discharge Planning: Ability to manage health-related needs will improve Description Pt. Non-compliant. Will not answer questions or engage in conversation with RN about health.  10/22/2017 0419 - Progressing by Verne Grain, RN   Compliant with medications at bedtime only with Glucerna. Encouraged water but patient yelled and stated he doesn't drink water. Incontinent of bladder, per care provided and pad replaced.  Sitter remains at bedside.

## 2017-10-22 NOTE — Progress Notes (Signed)
PROGRESS NOTE    Shawn Pearson  IRJ:[id number] DOB: 1941/03/14 DOA: 09/05/2017 PCP: [name], NP     Brief Narrative:  Shawn Pearson is a 77 year old malenursing home residentwho presented with chest pain and uncontrolled hypertension. Patient is known to have DM, CAD, dementia, chronic kidney disease, hypertension, stage III rectal CA, severe PVD with bilateral LE amputation. Apparently patient hadbeen refusing his medications at the skilled nursing facility. Patient was admitted to the hospital working diagnosis uncontrolled hypertension. While in hospital reported to be suicidal and homicidal.He was seen by psychiatry, recommend IV Depakoteand inpatient geripsychiatric facility. While on this admission, developed rectal bleeding 12/15. He has diagnosis of adenocarcinoma 02/2017 at time of colonoscopy and previous GI bleed 03/2017, underwent sigmoidoscopy with APC ablation to the bleeding tumor +/- radiation therapy. GI was consulted during this admission, felt there was nothing different they would offer. Hospitalization has been further complicated by patient compliance with medical care, aggression. Psych was re-consulted; patient improvement on antipsychotic, patient is now recommended for skilled nursing facility placement.  Assessment & Plan:   Principal Problem:   Dementia Active Problems:   Hypertensive urgency   CKD stage 3 secondary to diabetes (HCC)   Rectal cancer (HCC)   Chest pain   Acute on chronic anemia   Noncompliance with medication regimen   Agitation   Elevated troponin   Malignant neoplasm of colon [facility])   Rectal bleeding   Palliative care by specialist   DNR (do not resuscitate)   Rectal bleed and stage III rectal adenocarcinoma  -Originally diagnosed in 02/2017 and reportedly treated with some radiation then  -Continues to have intermittent rectal bleeding, back in 03/2017 underwent sigmoidoscopy and APC ablation  -Seen by gastroenterology this  admission, poor candidate for further treatments due to vascular dementia and significant psychiatric issues with agitation and refusal of care   Acute on chronic blood loss anemia due to above  -Status post multiple units of PRBC this admission, 1 unit on 12/15, and 2 units of PRBC on 12/27 -Hbg stable   Constipation -Improved, had BM documented 1/2   -Continue current bowel regimen   Dementia with behavioral disturbances  -Ondivaloprexandmirtazapine, melatonin -Seen by psychiatry this admission  Hypertension -Continue amlodipine, carvedilol, clonidine changed to patch  -BP high today, resume lasix at home dose, resume cozaar  Chronic combined systolic/diastolic CHF -ECHO showed LVEF 40-45%, Grade 2 DD, hypokinesis of the inf wall -Resume lasix   Esophagitis  -Continue pepcid   Type 2 diabetes mellitus -Continue basal insulin lantus 15 U and sliding scale -Stable BS   Chronic kidney disease stage III -Stable, avoid nephrotoxic medications  Goals of care -Has been seen by palliative care this admission and but unable complete family meeting with palliative medicine due to lack of response, unfortunately family is not actively involved  DVT prophylaxis: No anticoag due to blood loss anemia, 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  Code Status: DNR Family Communication: No family at bedside Disposition Plan: Pending SNF placement, suicide sitter at bedside discontinued today    Consultants:   GI  Psych  Procedures:   None   Antimicrobials:  Anti-infectives (From admission, onward)   Start     Dose/Rate Route Frequency Ordered Stop   09/17/17 1000  fluconazole (DIFLUCAN) tablet 100 mg  Status:  Discontinued     100 mg Oral Daily 09/16/17 1313 09/21/17 1346   09/17/17 1000  fluconazole (DIFLUCAN) tablet 100 mg  Status:  Discontinued     100 mg  Oral Daily 09/21/17 1346 09/28/17 1033   09/12/17 1100  fluconazole (DIFLUCAN) IVPB 100 mg  Status:  Discontinued     100  mg 50 mL/hr over 60 Minutes Intravenous Every 24 hours 09/12/17 1001 09/16/17 1313        Subjective: Patient complains of throat pain and vision loss. Upon further questioning, vision loss is chronic, he had been recommended for reading glasses at his nursing facility.  He is able to visualize me during examination.  No other complaints.  Objective: Vitals:   10/21/17 0954 10/22/17 0643 10/22/17 0644 10/22/17 0859  BP: (!) 156/57  (!) 172/69 (!) 175/68  Pulse: 61  (!) 58 60  Resp:   17   Temp:   (!) 97.4 F (36.3 C)   TempSrc:   Oral   SpO2:   97%   Weight:  104.2 kg (229 lb 11.5 oz)    Height:        Intake/Output Summary (Last 24 hours) at 10/22/2017 1149 Last data filed at 10/22/2017 0935 Gross per 24 hour  Intake 1057 ml  Output -  Net 1057 ml   Filed Weights   09/22/17 0445 10/21/17 0500 10/22/17 0643  Weight: 95.6 kg (210 lb 12.2 oz) 101.7 kg (224 lb 3.3 oz) 104.2 kg (229 lb 11.5 oz)    Examination:  General exam: Appears calm and comfortable, hard of hearing  Respiratory system: Clear to auscultation. Respiratory effort normal. Cardiovascular system: S1 & S2 heard, RRR. No JVD, murmurs, rubs, gallops or clicks. No pedal edema. Gastrointestinal system: Abdomen is nondistended, soft and nontender. No organomegaly or masses felt. Normal bowel sounds heard.  Central nervous system: Alert, verbal although very poor historian  Extremities: +Bilateral BKA  Skin: No rashes, lesions or ulcers on exposed skin  Psychiatry: +Dementia   Data Reviewed: I have personally reviewed following labs and imaging studies  CBC: Recent Labs  Lab 10/15/17 1248 10/16/17 1026 10/18/17 0516 10/19/17 0606  WBC 7.6 10.1 9.4 8.2  NEUTROABS 5.3 7.6 6.9  --   HGB 6.2* 8.3* 7.8* 7.8*  HCT 19.0* 25.1* 24.2* 24.6*  MCV 80.5 79.9 81.5 82.6  PLT 285 303 276 062   Basic Metabolic Panel: Recent Labs  Lab 10/15/17 1248 10/16/17 1026 10/17/17 1836 10/22/17 0236  NA 135 135 139 135  K  4.7 5.2* 4.7 4.5  CL 107 103 108 104  CO2 22 21* 21* 25  GLUCOSE 178* 191* 123* 111*  BUN 38* 43* 43* 40*  CREATININE 1.70* 1.66* 1.62* 1.75*  CALCIUM 7.7* 8.1* 8.3* 7.7*   GFR: Estimated Creatinine Clearance: 45.5 mL/min (A) (by C-G formula based on SCr of 1.75 mg/dL (H)). Liver Function Tests: No results for input(s): AST, ALT, ALKPHOS, BILITOT, PROT, ALBUMIN in the last 168 hours. No results for input(s): LIPASE, AMYLASE in the last 168 hours. No results for input(s): AMMONIA in the last 168 hours. Coagulation Profile: No results for input(s): INR, PROTIME in the last 168 hours. Cardiac Enzymes: No results for input(s): CKTOTAL, CKMB, CKMBINDEX, TROPONINI in the last 168 hours. BNP (last 3 results) No results for input(s): PROBNP in the last 8760 hours. HbA1C: No results for input(s): HGBA1C in the last 72 hours. CBG: Recent Labs  Lab 10/20/17 1650 10/20/17 2104 10/21/17 1211 10/21/17 2116 10/22/17 0734  GLUCAP 101* 114* 156* 133* 72   Lipid Profile: No results for input(s): CHOL, HDL, LDLCALC, TRIG, CHOLHDL, LDLDIRECT in the last 72 hours. Thyroid Function Tests: No results for input(s): TSH,  T4TOTAL, FREET4, T3FREE, THYROIDAB in the last 72 hours. Anemia Panel: No results for input(s): VITAMINB12, FOLATE, FERRITIN, TIBC, IRON, RETICCTPCT in the last 72 hours. Sepsis Labs: No results for input(s): PROCALCITON, LATICACIDVEN in the last 168 hours.  No results found for this or any previous visit (from the past 240 hour(s)).     Radiology Studies: No results found.    Scheduled Meds: . amLODipine  10 mg Oral Daily  . bisacodyl  10 mg Rectal Daily  . carvedilol  12.5 mg Oral BID WC  . cloNIDine  0.2 mg Transdermal Weekly  . divalproex  125 mg Oral Q12H  . famotidine  20 mg Oral QHS  . feeding supplement (GLUCERNA SHAKE)  237 mL Oral TID BM  . finasteride  5 mg Oral Daily  . furosemide  40 mg Oral Daily  . gabapentin  100 mg Oral QHS  . insulin aspart  0-9  Units Subcutaneous TID WC  . insulin glargine  15 Units Subcutaneous QHS  . Melatonin  3 mg Oral QHS  . mirtazapine  15 mg Oral QHS  . senna-docusate  1 tablet Oral BID  . tamsulosin  0.4 mg Oral Daily   Continuous Infusions:   LOS: 46 days    Time spent: 40 minutes   Dessa Phi, DO [facility] [email or web address] Password [email or web address] 10/22/2017, 11:49 AM

## 2017-10-22 NOTE — NC FL2 (Signed)
[place] MEDICAID FL2 LEVEL OF CARE SCREENING TOOL     IDENTIFICATION  Patient Name: Shawn Pearson Birthdate: 30-Nov-1940 Sex: male Admission Date (Current Location): 09/05/2017  [facility] and [place] Number:  Herbalist and Address:  [facility] [place] [address] [place], [place] [address]      Provider Number: [id number]  Attending Physician Name and Address:  Dessa Phi, DO  Relative Name and Phone Number:       Current Level of Care: Hospital Recommended Level of Care: [place] Prior Approval Number:    Date Approved/Denied:   PASRR Number: [id number]  Discharge Plan: SNF    Current Diagnoses: Patient Active Problem List   Diagnosis Date Noted  . Palliative care by specialist   . DNR (do not resuscitate)   . Malignant neoplasm of colon ([place])   . Rectal bleeding   . Elevated troponin   . Agitation   . Dementia 09/06/2017  . Noncompliance with medication regimen 09/06/2017  . Acute on chronic anemia 08/30/2017  . Hypertension 08/30/2017  . Hypertensive urgency 08/21/2017  . DM2 (diabetes mellitus, type 2) ([place]) 08/21/2017  . CKD stage 3 secondary to diabetes ([place]) 08/21/2017  . Rectal cancer ([place]) 08/21/2017  . Hypertensive emergency 08/21/2017  . Chest pain     Orientation RESPIRATION BLADDER Height & Weight     Self, Time, Situation, Place  Normal Incontinent Weight: 104.2 kg (229 lb 11.5 oz) Height:  6\' 2"  (188 cm)  BEHAVIORAL SYMPTOMS/MOOD NEUROLOGICAL BOWEL NUTRITION STATUS  Other (Comment)(Irritable, often uncooperative)   Incontinent Diet(Please see DC Summary)  AMBULATORY STATUS COMMUNICATION OF NEEDS Skin   Extensive Assist Verbally Normal                       Personal Care Assistance Level of Assistance  Bathing, Feeding, Dressing Bathing Assistance: Maximum assistance Feeding assistance: Independent Dressing Assistance: Maximum assistance     Functional Limitations Info   Sight, Hearing, Speech Sight Info: Adequate Hearing Info: Adequate Speech Info: Adequate    SPECIAL CARE FACTORS FREQUENCY  PT (By licensed PT), OT (By licensed OT)     PT Frequency: 5x/week OT Frequency: 3x/week            Contractures Contractures Info: Not present    Additional Factors Info  Code Status, Allergies, Psychotropic, Insulin Sliding Scale Code Status Info: DNR Allergies Info: NKA Psychotropic Info: clonidine;depakote sprinkle Insulin Sliding Scale Info: 3x daily with meals       Current Medications (10/22/2017):  This is the current hospital active medication list Current Facility-Administered Medications  Medication Dose Route Frequency Provider Last Rate Last Dose  . acetaminophen (TYLENOL) tablet 650 mg  650 mg Oral Q4H PRN Phillips Grout, MD   650 mg at 10/20/17 0336  . amLODipine (NORVASC) tablet 10 mg  10 mg Oral Daily Patrecia Pour, MD   10 mg at 10/21/17 0954  . bisacodyl (DULCOLAX) suppository 10 mg  10 mg Rectal Daily Alma Friendly, MD   10 mg at 10/20/17 0830  . carvedilol (COREG) tablet 12.5 mg  12.5 mg Oral BID WC Alphonzo Grieve, MD   12.5 mg at 10/21/17 0954  . cloNIDine (CATAPRES - Dosed in mg/24 hr) patch 0.2 mg  0.2 mg Transdermal Weekly Alma Friendly, MD   0.2 mg at 10/19/17 0809  . divalproex (DEPAKOTE SPRINKLE) capsule 125 mg  125 mg Oral Q12H Patrecia Pour, MD  125 mg at 10/21/17 2240  . famotidine (PEPCID) tablet 20 mg  20 mg Oral QHS Arrien, Jimmy Picket, MD   20 mg at 10/21/17 2240  . feeding supplement (GLUCERNA SHAKE) (GLUCERNA SHAKE) liquid 237 mL  237 mL Oral TID BM Nita Sells, MD   237 mL at 10/21/17 2250  . finasteride (PROSCAR) tablet 5 mg  5 mg Oral Daily Derrill Kay A, MD   5 mg at 10/21/17 0954  . furosemide (LASIX) tablet 40 mg  40 mg Oral Daily Domenic Polite, MD   40 mg at 10/21/17 0954  . gabapentin (NEURONTIN) capsule 100 mg  100 mg Oral QHS Patrecia Pour, MD   100 mg at 10/21/17 2250  .  hydrALAZINE (APRESOLINE) injection 5 mg  5 mg Intravenous Q4H PRN Gardiner Barefoot, NP   5 mg at 10/18/17 2157  . insulin aspart (novoLOG) injection 0-9 Units  0-9 Units Subcutaneous TID WC Theodis Blaze, MD   2 Units at 10/21/17 1226  . insulin glargine (LANTUS) injection 15 Units  15 Units Subcutaneous QHS Georgette Shell, MD   15 Units at 10/21/17 2150  . ipratropium-albuterol (DUONEB) 0.5-2.5 (3) MG/3ML nebulizer solution 3 mL  3 mL Nebulization Q6H PRN Alma Friendly, MD   3 mL at 10/04/17 1211  . Melatonin TABS 3 mg  3 mg Oral QHS Regalado, Belkys A, MD   3 mg at 10/21/17 2250  . mirtazapine (REMERON) tablet 15 mg  15 mg Oral QHS Alma Friendly, MD   15 mg at 10/21/17 2250  . polyvinyl alcohol (LIQUIFILM TEARS) 1.4 % ophthalmic solution 1 drop  1 drop Both Eyes PRN Dana Allan I, MD   1 drop at 10/21/17 2149  . senna-docusate (Senokot-S) tablet 1 tablet  1 tablet Oral BID Alma Friendly, MD   1 tablet at 10/21/17 2250  . simethicone (MYLICON) chewable tablet 80 mg  80 mg Oral QID PRN Arrien, Jimmy Picket, MD   80 mg at 10/12/17 1004  . tamsulosin (FLOMAX) capsule 0.4 mg  0.4 mg Oral Daily Derrill Kay A, MD   0.4 mg at 10/21/17 0954  . traMADol (ULTRAM) tablet 50 mg  50 mg Oral Q12H PRN Nita Sells, MD   50 mg at 10/21/17 4098     Discharge Medications: Please see discharge summary for a list of discharge medications.  Relevant Imaging Results:  Relevant Lab Results:   Additional Information SSN: [id number]  Benard Halsted, LCSWA

## 2017-10-22 NOTE — Progress Notes (Signed)
Pt refused to have his CBG checked and is refusing any meds at this time. Will continue to assess.

## 2017-10-22 NOTE — Progress Notes (Signed)
Per Psychiatry, patient cleared for discharge to long term care SNF. CSW will initiate search.  Percell Locus Jarquavious Fentress LCSWA [phone]

## 2017-10-22 NOTE — Progress Notes (Signed)
Nutrition Follow-up  DOCUMENTATION CODES:   Obesity unspecified  INTERVENTION:   Continue Glucerna Shake po TID, each supplement provides 220 kcal and 10 grams of protein  NUTRITION DIAGNOSIS:   Inadequate oral intake related to lethargy/confusion as evidenced by meal completion < 25%.  Progressing  GOAL:   Patient will meet greater than or equal to 90% of their needs  Progressing  MONITOR:   PO intake, Supplement acceptance, Labs, Weight trends, Skin, I & O's  ASSESSMENT:   Patient is a 77 year old male with past medical history significant for dementia, CKD, HTN , medical noncompliance wth meds, neg stress test in 2/18. Patient was admitted from SNF with chest pain, uncontrolled blood pressure due to non compliance with medication and headache.  He says he does not like the nursing home he is at and that is why he refuses to take his meds. The patient has been non co operative with the Nursing staff, and still refusing his medication. Psych has been consulted as patient likely has dementia with behavioral problems.   12/1- remeron initiated by psychiatry in attempt to stimulate appetite and decrease agitation 12/3- Ensure supplement changed to Glucerna by nursing staff due to increased CBGS 12/23- hypoglycemic event (CBG 63) 12/26- remeron dose increased (7.5 mg to 15 mg) by psych in attempt to improve irritability and appetite stimulation  Pt continues to be compliant with Glucerna however intermittently refusing care. Meal intake average is ~50% at this time.  Pt s/p multiple units PRBC since admission. Per chart, pt cleared for long term care SNF discharge by Psychiatry.   Labs reviewed; CBG 72-231, Hemoglobin 7.8, HCT 24.6 Medications reviewed; Pepcid, Lasix, sliding scale insulin, Lantus, Remeron, Senokot-S  Diet Order:  Diet Carb Modified Fluid consistency: Thin; Room service appropriate? Yes  EDUCATION NEEDS:   Not appropriate for education at this time  Skin:   Skin Assessment: Reviewed RN Assessment  Last BM:  10/20/17  Height:   Ht Readings from Last 1 Encounters:  09/06/17 6\' 2"  (1.88 m)    Weight:   Wt Readings from Last 1 Encounters:  10/22/17 229 lb 11.5 oz (104.2 kg)    Ideal Body Weight:  75.1 kg  BMI:  Body mass index is 29.49 kg/m.  Estimated Nutritional Needs:   Kcal:  1650-1850  Protein:  80-95 grams  Fluid:  1.6-1.8 L  Parks Ranger, MS, RDN, LDN 10/22/2017 1:10 PM

## 2017-10-23 LAB — CBC
HEMATOCRIT: 22.4 % — AB (ref 39.0–52.0)
Hemoglobin: 7 g/dL — ABNORMAL LOW (ref 13.0–17.0)
MCH: 25.6 pg — AB (ref 26.0–34.0)
MCHC: 31.3 g/dL (ref 30.0–36.0)
MCV: 82.1 fL (ref 78.0–100.0)
Platelets: 273 10*3/uL (ref 150–400)
RBC: 2.73 MIL/uL — ABNORMAL LOW (ref 4.22–5.81)
RDW: 15.8 % — AB (ref 11.5–15.5)
WBC: 7.9 10*3/uL (ref 4.0–10.5)

## 2017-10-23 LAB — BASIC METABOLIC PANEL
Anion gap: 7 (ref 5–15)
BUN: 44 mg/dL — ABNORMAL HIGH (ref 6–20)
CALCIUM: 8 mg/dL — AB (ref 8.9–10.3)
CO2: 25 mmol/L (ref 22–32)
CREATININE: 1.7 mg/dL — AB (ref 0.61–1.24)
Chloride: 104 mmol/L (ref 101–111)
GFR calc Af Amer: 43 mL/min — ABNORMAL LOW (ref >60)
GFR calc non Af Amer: 37 mL/min — ABNORMAL LOW (ref >60)
Glucose, Bld: 129 mg/dL — ABNORMAL HIGH (ref 65–99)
Potassium: 4.3 mmol/L (ref 3.5–5.1)
Sodium: 136 mmol/L (ref 135–145)

## 2017-10-23 LAB — GLUCOSE, CAPILLARY
GLUCOSE-CAPILLARY: 89 mg/dL (ref 65–99)
GLUCOSE-CAPILLARY: 103 mg/dL — AB (ref 65–99)
GLUCOSE-CAPILLARY: 157 mg/dL — AB (ref 65–99)
Glucose-Capillary: 189 mg/dL — ABNORMAL HIGH (ref 65–99)

## 2017-10-23 LAB — PREPARE RBC (CROSSMATCH)

## 2017-10-23 MED ORDER — HYDROCORTISONE 2.5 % RE CREA
TOPICAL_CREAM | Freq: Four times a day (QID) | RECTAL | Status: DC
  Administered 2017-10-23 – 2017-10-24 (×2): 1 via RECTAL
  Administered 2017-10-24: 13:00:00 via RECTAL
  Administered 2017-10-24: 1 via RECTAL
  Administered 2017-10-24 – 2017-11-05 (×37): via RECTAL
  Filled 2017-10-23 (×3): qty 28.35

## 2017-10-23 MED ORDER — SODIUM CHLORIDE 0.9 % IV SOLN
Freq: Once | INTRAVENOUS | Status: AC
  Administered 2017-10-23: 08:00:00 via INTRAVENOUS

## 2017-10-23 MED ORDER — SODIUM CHLORIDE 0.9% FLUSH
10.0000 mL | INTRAVENOUS | Status: DC | PRN
  Administered 2017-10-25: 10 mL
  Administered 2017-10-28: 20 mL
  Administered 2017-10-28 – 2017-11-04 (×7): 10 mL
  Filled 2017-10-23 (×9): qty 40

## 2017-10-23 MED ORDER — CARVEDILOL 25 MG PO TABS
25.0000 mg | ORAL_TABLET | Freq: Two times a day (BID) | ORAL | Status: DC
  Administered 2017-10-23 – 2017-11-04 (×22): 25 mg via ORAL
  Filled 2017-10-23 (×24): qty 1

## 2017-10-23 MED ORDER — SODIUM CHLORIDE 0.9% FLUSH
10.0000 mL | Freq: Two times a day (BID) | INTRAVENOUS | Status: DC
  Administered 2017-10-23 – 2017-11-05 (×21): 10 mL

## 2017-10-23 NOTE — Plan of Care (Addendum)
  Progressing Health Behavior/Discharge Planning: Ability to manage health-related needs will improve Description Pt. Non-compliant. Will not answer questions or engage in conversation with RN about health.  10/23/2017 0609 - Progressing by Verne Grain, RN 10/23/2017 9794 - Progressing by Verne Grain, RN   Complains of severe headache and pain to rectum/buttocks. Given tylenol and tramadol, does not last twelve hours as ordered.

## 2017-10-23 NOTE — Progress Notes (Signed)
PROGRESS NOTE    Shawn Pearson  ZOX:[id number] DOB: June 05, 1941 DOA: 09/05/2017 PCP: [name], NP     Brief Narrative:  Shawn Pearson is a 77 year old malenursing home residentwho presented with chest pain and uncontrolled hypertension. Patient is known to have DM, CAD, dementia, chronic kidney disease, hypertension, stage III rectal CA, severe PVD with bilateral LE amputation. Apparently patient hadbeen refusing his medications at the skilled nursing facility. Patient was admitted to the hospital working diagnosis uncontrolled hypertension. While in hospital reported to be suicidal and homicidal.He was seen by psychiatry, recommend IV Depakoteand inpatient geripsychiatric facility. While on this admission, developed rectal bleeding 12/15. He has diagnosis of adenocarcinoma 02/2017 at time of colonoscopy and previous GI bleed 03/2017, underwent sigmoidoscopy with APC ablation to the bleeding tumor +/- radiation therapy. GI was consulted during this admission, felt there was nothing different they would offer. Hospitalization has been further complicated by patient compliance with medical care, aggression. Psych was re-consulted; patient improvement on antipsychotic, patient is now recommended for skilled nursing facility placement.  Assessment & Plan:   Principal Problem:   Dementia Active Problems:   Hypertensive urgency   CKD stage 3 secondary to diabetes (HCC)   Rectal cancer (HCC)   Chest pain   Acute on chronic anemia   Noncompliance with medication regimen   Agitation   Elevated troponin   Malignant neoplasm of colon [facility])   Rectal bleeding   Palliative care by specialist   DNR (do not resuscitate)   Rectal bleed and stage III rectal adenocarcinoma  -Originally diagnosed in 02/2017 and reportedly treated with some radiation then  -Continues to have intermittent rectal bleeding, back in 03/2017 underwent sigmoidoscopy and APC ablation  -Seen by gastroenterology this  admission, poor candidate for further treatments due to vascular dementia and significant psychiatric issues with agitation and refusal of care   Acute on chronic blood loss anemia due to above  -Status post multiple units of PRBC this admission, 1 unit on 12/15, and 2 units of pRBC on 12/27 -Hgb 7.0 today. No gross bleeding per nursing report. Will transfuse 2 units pRBC today 1/4. Patient may need intermittent transfusions due to above, poor candidate for further invasive interventions   Constipation -Improved, had BM documented 1/2   -Continue current bowel regimen   Dementia with behavioral disturbances  -Ondivaloprexandmirtazapine, melatonin -Seen by psychiatry this admission  Hypertension -Continue amlodipine, carvedilol, clonidine changed to patch  -BP high today, resume lasix at home dose, resume cozaar. 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  Code Status: DNR Family Communication: No family at bedside, but was able to update wife over the phone this afternoon  Disposition Plan: Pending SNF placement   Consultants:   GI  Psych  Procedures:   None   Antimicrobials:  Anti-infectives (From admission, onward)   Start     Dose/Rate Route Frequency Ordered Stop   09/17/17 1000  fluconazole (DIFLUCAN) tablet  100 mg  Status:  Discontinued     100 mg Oral Daily 09/16/17 1313  09/21/17 1346   09/17/17 1000  fluconazole (DIFLUCAN) tablet 100 mg  Status:  Discontinued     100 mg Oral Daily 09/21/17 1346 09/28/17 1033   09/12/17 1100  fluconazole (DIFLUCAN) IVPB 100 mg  Status:  Discontinued     100 mg 50 mL/hr over 60 Minutes Intravenous Every 24 hours 09/12/17 1001 09/16/17 1313       Subjective: Complains of back pain, throat pain. Uncooperative with interview and very poor historian   Objective: Vitals:   10/23/17 0516 10/23/17 0653 10/23/17 1159 10/23/17 1214  BP: (!) 144/37 (!) 154/59 (!) 183/63 (!) 177/88  Pulse: (!) 59 (!) 58 61 66  Resp: 19  18 16   Temp: 98.1 F (36.7 C) 98.5 F (36.9 C) 98.7 F (37.1 C) 98.3 F (36.8 C)  TempSrc:   Oral Oral  SpO2: 100% 98%    Weight:      Height:       No intake or output data in the 24 hours ending 10/23/17 1335 Filed Weights   09/22/17 0445 10/21/17 0500 10/22/17 0643  Weight: 95.6 kg (210 lb 12.2 oz) 101.7 kg (224 lb 3.3 oz) 104.2 kg (229 lb 11.5 oz)    Examination:  General exam: Appears calm and comfortable, hard of hearing  Respiratory system: Clear to auscultation. Respiratory effort normal. Cardiovascular system: S1 & S2 heard, RRR. No JVD, murmurs, rubs, gallops or clicks. No pedal edema. Gastrointestinal system: Abdomen is nondistended, soft and nontender. No organomegaly or masses felt. Normal bowel sounds heard.  Central nervous system: Alert, verbal although very poor historian  Extremities: +Bilateral BKA  Skin: No rashes, lesions or ulcers on exposed skin  Psychiatry: +Dementia   Data Reviewed: I have personally reviewed following labs and imaging studies  CBC: Recent Labs  Lab 10/18/17 0516 10/19/17 0606 10/23/17 0446  WBC 9.4 8.2 7.9  NEUTROABS 6.9  --   --   HGB 7.8* 7.8* 7.0*  HCT 24.2* 24.6* 22.4*  MCV 81.5 82.6 82.1  PLT 276 293 376   Basic Metabolic Panel: Recent Labs  Lab 10/17/17 1836 10/22/17 0236 10/23/17 0446  NA 139 135 136  K 4.7 4.5 4.3  CL 108 104  104  CO2 21* 25 25  GLUCOSE 123* 111* 129*  BUN 43* 40* 44*  CREATININE 1.62* 1.75* 1.70*  CALCIUM 8.3* 7.7* 8.0*   GFR: Estimated Creatinine Clearance: 46.8 mL/min (A) (by C-G formula based on SCr of 1.7 mg/dL (H)). Liver Function Tests: No results for input(s): AST, ALT, ALKPHOS, BILITOT, PROT, ALBUMIN in the last 168 hours. No results for input(s): LIPASE, AMYLASE in the last 168 hours. No results for input(s): AMMONIA in the last 168 hours. Coagulation Profile: No results for input(s): INR, PROTIME in the last 168 hours. Cardiac Enzymes: No results for input(s): CKTOTAL, CKMB, CKMBINDEX, TROPONINI in the last 168 hours. BNP (last 3 results) No results for input(s): PROBNP in the last 8760 hours. HbA1C: No results for input(s): HGBA1C in the last 72 hours. CBG: Recent Labs  Lab 10/22/17 0734 10/22/17 1710 10/22/17 2113 10/23/17 0805 10/23/17 1153  GLUCAP 72 220* 183* 89 103*   Lipid Profile: No results for input(s): CHOL, HDL, LDLCALC, TRIG, CHOLHDL, LDLDIRECT in the last 72 hours. Thyroid Function Tests: No results for input(s): TSH, T4TOTAL, FREET4, T3FREE, THYROIDAB in the last 72 hours. Anemia Panel: No results for input(s): VITAMINB12, FOLATE, FERRITIN, TIBC,  IRON, RETICCTPCT in the last 72 hours. Sepsis Labs: No results for input(s): PROCALCITON, LATICACIDVEN in the last 168 hours.  No results found for this or any previous visit (from the past 240 hour(s)).     Radiology Studies: No results found.    Scheduled Meds: . amLODipine  10 mg Oral Daily  . bisacodyl  10 mg Rectal Daily  . carvedilol  25 mg Oral BID WC  . cloNIDine  0.2 mg Transdermal Weekly  . divalproex  125 mg Oral Q12H  . famotidine  20 mg Oral QHS  . feeding supplement (GLUCERNA SHAKE)  237 mL Oral TID BM  . finasteride  5 mg Oral Daily  . furosemide  60 mg Oral Daily  . gabapentin  100 mg Oral QHS  . insulin aspart  0-9 Units Subcutaneous TID WC  . insulin glargine  15 Units  Subcutaneous QHS  . losartan  50 mg Oral Daily  . Melatonin  3 mg Oral QHS  . mirtazapine  15 mg Oral QHS  . senna-docusate  1 tablet Oral BID  . tamsulosin  0.4 mg Oral Daily   Continuous Infusions: . sodium chloride       LOS: 47 days    Time spent: 30 minutes   Dessa Phi, DO [facility] [email or web address] Password [email or web address] 10/23/2017, 1:35 PM

## 2017-10-23 NOTE — Progress Notes (Signed)
Palliative Medicine RN Note: Rec'd a message from [place]; family is on the way and would like to meet. Returned her call and got a Advertising account executive; PMT signed off a week ago. We are happy to attempt to re-engage, but we do not have the staff to see him today. We could likely see him tomorrow or the next day, but if the family is on the way from out of town now, PMT recommends calling the attending MD to speak with the family.  If PMT re-involvement if needed, please obtain consult order from the attending service.   Marjie Skiff Kasidy Gianino, RN, BSN, [facility] 10/23/2017 1:37 PM Office [phone]

## 2017-10-23 NOTE — Progress Notes (Signed)
Blood stopped due to leaking IV

## 2017-10-24 ENCOUNTER — Inpatient Hospital Stay (HOSPITAL_COMMUNITY): Payer: Medicare Other

## 2017-10-24 LAB — CBC
HCT: 26.7 % — ABNORMAL LOW (ref 39.0–52.0)
Hemoglobin: 8.4 g/dL — ABNORMAL LOW (ref 13.0–17.0)
MCH: 25.9 pg — ABNORMAL LOW (ref 26.0–34.0)
MCHC: 31.5 g/dL (ref 30.0–36.0)
MCV: 82.4 fL (ref 78.0–100.0)
Platelets: 278 10*3/uL (ref 150–400)
RBC: 3.24 MIL/uL — AB (ref 4.22–5.81)
RDW: 15.3 % (ref 11.5–15.5)
WBC: 8.7 10*3/uL (ref 4.0–10.5)

## 2017-10-24 LAB — TYPE AND SCREEN
ABO/RH(D): O POS
Antibody Screen: NEGATIVE
Unit division: 0
Unit division: 0

## 2017-10-24 LAB — BASIC METABOLIC PANEL
ANION GAP: 9 (ref 5–15)
BUN: 39 mg/dL — ABNORMAL HIGH (ref 6–20)
CO2: 26 mmol/L (ref 22–32)
Calcium: 8 mg/dL — ABNORMAL LOW (ref 8.9–10.3)
Chloride: 102 mmol/L (ref 101–111)
Creatinine, Ser: 1.6 mg/dL — ABNORMAL HIGH (ref 0.61–1.24)
GFR calc Af Amer: 46 mL/min — ABNORMAL LOW (ref >60)
GFR, EST NON AFRICAN AMERICAN: 40 mL/min — AB (ref >60)
Glucose, Bld: 156 mg/dL — ABNORMAL HIGH (ref 65–99)
POTASSIUM: 4.1 mmol/L (ref 3.5–5.1)
SODIUM: 137 mmol/L (ref 135–145)

## 2017-10-24 LAB — BPAM RBC
Blood Product Expiration Date: 201901222359
Blood Product Expiration Date: 201901242359
ISSUE DATE / TIME: 201901041058
ISSUE DATE / TIME: 201901041559
UNIT TYPE AND RH: 5100
Unit Type and Rh: 5100

## 2017-10-24 LAB — GLUCOSE, CAPILLARY
GLUCOSE-CAPILLARY: 111 mg/dL — AB (ref 65–99)
GLUCOSE-CAPILLARY: 168 mg/dL — AB (ref 65–99)
Glucose-Capillary: 215 mg/dL — ABNORMAL HIGH (ref 65–99)
Glucose-Capillary: 131 mg/dL — ABNORMAL HIGH (ref 65–99)

## 2017-10-24 MED ORDER — HYDRALAZINE HCL 10 MG PO TABS: 10.0000 mg | ORAL_TABLET | Freq: Three times a day (TID) | ORAL | Status: DC

## 2017-10-24 MED ORDER — POLYETHYLENE GLYCOL 3350 17 G PO PACK
17.0000 g | PACK | Freq: Every day | ORAL | Status: DC
  Administered 2017-10-24 – 2017-11-05 (×9): 17 g via ORAL
  Filled 2017-10-24 (×11): qty 1

## 2017-10-24 MED ORDER — LABETALOL HCL 5 MG/ML IV SOLN
10.0000 mg | INTRAVENOUS | Status: DC | PRN
  Administered 2017-10-24 – 2017-11-06 (×2): 10 mg via INTRAVENOUS
  Filled 2017-10-24 (×2): qty 4

## 2017-10-24 MED ORDER — HYDRALAZINE HCL 20 MG/ML IJ SOLN
5.0000 mg | INTRAMUSCULAR | Status: DC | PRN
  Administered 2017-10-24 – 2017-11-06 (×10): 5 mg via INTRAVENOUS
  Filled 2017-10-24 (×11): qty 1

## 2017-10-24 MED ORDER — TRAMADOL HCL 50 MG PO TABS
50.0000 mg | ORAL_TABLET | Freq: Three times a day (TID) | ORAL | Status: DC | PRN
  Administered 2017-10-24 – 2017-10-28 (×8): 50 mg via ORAL
  Filled 2017-10-24 (×9): qty 1

## 2017-10-24 MED ORDER — HYDROMORPHONE HCL 1 MG/ML IJ SOLN
0.5000 mg | Freq: Once | INTRAMUSCULAR | Status: AC
  Administered 2017-10-24: 0.5 mg via INTRAVENOUS
  Filled 2017-10-24: qty 1

## 2017-10-24 NOTE — Progress Notes (Signed)
Patient continues to scream gave the 0.5 mg IV dilaudid when asked what is wrong he screams I don't know Mama. I told patient by screaming like he is he is causing his bp to be elevated and pain medication will not work because you are so agitated asked him to stop screaming so that he can calm down if he does will recheck bp. Arthor Captain LPN

## 2017-10-24 NOTE — Progress Notes (Signed)
Patient complained of rectal pain earlier during shift received Anusol and Tylenol 650 mg po pain decreased and patient slept for few hrs. BP noted 182/62 5 mg Hydralizine given IV bp recheck BP 202/70 hr 69 patient now is agitated.  but not confused he knows he is in hospital and that it is January 2019 [facility] paged.

## 2017-10-24 NOTE — Progress Notes (Signed)
PROGRESS NOTE    Shawn Pearson  BHA:[id number] DOB: 07-17-1941 DOA: 09/05/2017 PCP: [name], NP     Brief Narrative:  Shawn Pearson is a 77 year old malenursing home residentwho presented with chest pain and uncontrolled hypertension. Patient is known to have DM, CAD, dementia, chronic kidney disease, hypertension, stage III rectal CA, severe PVD with bilateral LE amputation. Apparently patient hadbeen refusing his medications at the skilled nursing facility. Patient was admitted to the hospital working diagnosis uncontrolled hypertension. While in hospital reported to be suicidal and homicidal.He was seen by psychiatry, recommend IV Depakoteand inpatient geripsychiatric facility. While on this admission, developed rectal bleeding 12/15. He has diagnosis of adenocarcinoma 02/2017 at time of colonoscopy and previous GI bleed 03/2017, underwent sigmoidoscopy with APC ablation to the bleeding tumor +/- radiation therapy. GI was consulted during this admission, felt there was nothing different they would offer. Hospitalization has been further complicated by patient compliance with medical care, aggression. Psych was re-consulted; patient improvement on antipsychotic, patient is now recommended for skilled nursing facility placement.  Assessment & Plan:   Principal Problem:   Dementia Active Problems:   Hypertensive urgency   CKD stage 3 secondary to diabetes (HCC)   Rectal cancer (HCC)   Chest pain   Acute on chronic anemia   Noncompliance with medication regimen   Agitation   Elevated troponin   Malignant neoplasm of colon [facility])   Rectal bleeding   Palliative care by specialist   DNR (do not resuscitate)   Rectal bleed and stage III rectal adenocarcinoma  -Originally diagnosed in 02/2017 and reportedly treated with some radiation then  -Continues to have intermittent rectal bleeding, back in 03/2017 underwent sigmoidoscopy and APC ablation  -Seen by gastroenterology this  admission, poor candidate for further treatments due to vascular dementia and significant psychiatric issues with agitation and refusal of care   Acute on chronic blood loss anemia due to above  -Status post multiple units of PRBC this admission, 1 unit on 12/15, 2 units of pRBC on 12/27, 2 units pRBC on 1/4. Patient may need intermittent transfusions due to above, poor candidate for further invasive interventions   Constipation -AXR revealed moderate stool burden. Will add miralax to bowel regimen.   Dementia with behavioral disturbances  -Ondivaloprexandmirtazapine, melatonin -Seen by psychiatry this admission  Hypertension -Continue amlodipine, carvedilol, clonidine changed to patch  -BP high today, resume lasix at home dose, resume cozaar. Coreg dose increased. -Patient refusing some of his oral medications and BP has been high   Chronic combined systolic/diastolic CHF -ECHO showed LVEF 40-45%, Grade 2 DD, hypokinesis of the inf wall -Resume lasix   Esophagitis  -Continue pepcid   Type 2 diabetes mellitus -Continue basal insulin lantus 15 U and sliding scale -Stable BS   Chronic kidney disease stage III -Stable, avoid nephrotoxic medications  Acute urinary retention -Bladder scan > 424ml. 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  Code Status: DNR Family Communication: No family at bedside today Disposition Plan: Pending SNF placement   Consultants:   GI  Psych  Procedures:   None   Antimicrobials:  Anti-infectives (From admission, onward)   Start     Dose/Rate Route Frequency Ordered Stop   09/17/17 1000  fluconazole (DIFLUCAN)  tablet 100 mg  Status:  Discontinued     100 mg Oral Daily  09/16/17 1313 09/21/17 1346   09/17/17 1000  fluconazole (DIFLUCAN) tablet 100 mg  Status:  Discontinued     100 mg Oral Daily 09/21/17 1346 09/28/17 1033   09/12/17 1100  fluconazole (DIFLUCAN) IVPB 100 mg  Status:  Discontinued     100 mg 50 mL/hr over 60 Minutes Intravenous Every 24 hours 09/12/17 1001 09/16/17 1313       Subjective: Complains of constipation. Per RN report, patient had BM last night. Also issues with urinary retention.   Objective: Vitals:   10/24/17 0505 10/24/17 0801 10/24/17 0921 10/24/17 0923  BP: (!) 186/70 (!) 190/74 (!) 215/81   Pulse: (!) 58 66  68  Resp:  18    Temp: (!) 97.5 F (36.4 C) 97.8 F (36.6 C)    TempSrc:  Oral    SpO2: 98% 94%    Weight:      Height:        Intake/Output Summary (Last 24 hours) at 10/24/2017 1147 Last data filed at 10/24/2017 1016 Gross per 24 hour  Intake 1156 ml  Output 200 ml  Net 956 ml   Filed Weights   09/22/17 0445 10/21/17 0500 10/22/17 0643  Weight: 95.6 kg (210 lb 12.2 oz) 101.7 kg (224 lb 3.3 oz) 104.2 kg (229 lb 11.5 oz)    Examination:  General exam: Appears calm and comfortable, hard of hearing  Respiratory system: Clear to auscultation. Respiratory effort normal. Cardiovascular system: S1 & S2 heard, RRR. No JVD, murmurs, rubs, gallops or clicks. No pedal edema. Gastrointestinal system: Abdomen is mildly distended, soft and nontender. No organomegaly or masses felt. Normal bowel sounds heard.  Central nervous system: Alert, verbal although very poor historian  Extremities: +Bilateral BKA  Skin: No rashes, lesions or ulcers on exposed skin  Psychiatry: +Dementia   Data Reviewed: I have personally reviewed following labs and imaging studies  CBC: Recent Labs  Lab 10/18/17 0516 10/19/17 0606 10/23/17 0446 10/24/17 0310  WBC 9.4 8.2 7.9 8.7  NEUTROABS 6.9  --   --   --   HGB 7.8* 7.8* 7.0* 8.4*  HCT 24.2* 24.6* 22.4* 26.7*  MCV 81.5 82.6 82.1 82.4  PLT 276 293 273 941   Basic Metabolic  Panel: Recent Labs  Lab 10/17/17 1836 10/22/17 0236 10/23/17 0446 10/24/17 0310  NA 139 135 136 137  K 4.7 4.5 4.3 4.1  CL 108 104 104 102  CO2 21* 25 25 26   GLUCOSE 123* 111* 129* 156*  BUN 43* 40* 44* 39*  CREATININE 1.62* 1.75* 1.70* 1.60*  CALCIUM 8.3* 7.7* 8.0* 8.0*   GFR: Estimated Creatinine Clearance: 49.8 mL/min (A) (by C-G formula based on SCr of 1.6 mg/dL (H)). Liver Function Tests: No results for input(s): AST, ALT, ALKPHOS, BILITOT, PROT, ALBUMIN in the last 168 hours. No results for input(s): LIPASE, AMYLASE in the last 168 hours. No results for input(s): AMMONIA in the last 168 hours. Coagulation Profile: No results for input(s): INR, PROTIME in the last 168 hours. Cardiac Enzymes: No results for input(s): CKTOTAL, CKMB, CKMBINDEX, TROPONINI in the last 168 hours. BNP (last 3 results) No results for input(s): PROBNP in the last 8760 hours. HbA1C: No results for input(s): HGBA1C in the last 72 hours. CBG: Recent Labs  Lab 10/23/17 0805 10/23/17 1153 10/23/17 1650 10/23/17 2037 10/24/17 0801  GLUCAP 89 103* 157* 189* 111*   Lipid Profile: No results for  input(s): CHOL, HDL, LDLCALC, TRIG, CHOLHDL, LDLDIRECT in the last 72 hours. Thyroid Function Tests: No results for input(s): TSH, T4TOTAL, FREET4, T3FREE, THYROIDAB in the last 72 hours. Anemia Panel: No results for input(s): VITAMINB12, FOLATE, FERRITIN, TIBC, IRON, RETICCTPCT in the last 72 hours. Sepsis Labs: No results for input(s): PROCALCITON, LATICACIDVEN in the last 168 hours.  No results found for this or any previous visit (from the past 240 hour(s)).     Radiology Studies: Dg Abd 1 View  Result Date: 10/24/2017 CLINICAL DATA:  Constipation EXAM: ABDOMEN - 1 VIEW COMPARISON:  10/14/2017 FINDINGS: Nonobstructive bowel gas pattern. Moderate colonic stool burden. Stable calcification overlying the stomach. Cardiomegaly.  ICD leads, incompletely visualized. IMPRESSION: Moderate colonic stool  burden. Electronically Signed   By: Julian Hy M.D.   On: 10/24/2017 10:53      Scheduled Meds: . amLODipine  10 mg Oral Daily  . bisacodyl  10 mg Rectal Daily  . carvedilol  25 mg Oral BID WC  . cloNIDine  0.2 mg Transdermal Weekly  . divalproex  125 mg Oral Q12H  . famotidine  20 mg Oral QHS  . feeding supplement (GLUCERNA SHAKE)  237 mL Oral TID BM  . finasteride  5 mg Oral Daily  . furosemide  60 mg Oral Daily  . gabapentin  100 mg Oral QHS  . hydrocortisone   Rectal QID  . insulin aspart  0-9 Units Subcutaneous TID WC  . insulin glargine  15 Units Subcutaneous QHS  . losartan  50 mg Oral Daily  . Melatonin  3 mg Oral QHS  . mirtazapine  15 mg Oral QHS  . senna-docusate  1 tablet Oral BID  . sodium chloride flush  10-40 mL Intracatheter Q12H  . tamsulosin  0.4 mg Oral Daily   Continuous Infusions:    LOS: 48 days    Time spent: 30 minutes   Dessa Phi, DO [facility] [email or web address] Password [facility] 10/24/2017, 11:47 AM

## 2017-10-24 NOTE — Progress Notes (Signed)
Patient continues to scream stating that he can not breath and his but hurts Neb treatment given and Anusol applied to anus hemorrhoid present Blood pressure still hypertensive Hydralazine given as ordered. Arthor Captain LPN

## 2017-10-25 LAB — GLUCOSE, CAPILLARY
GLUCOSE-CAPILLARY: 96 mg/dL (ref 65–99)
GLUCOSE-CAPILLARY: 129 mg/dL — AB (ref 65–99)
GLUCOSE-CAPILLARY: 153 mg/dL — AB (ref 65–99)

## 2017-10-25 LAB — BASIC METABOLIC PANEL
Anion gap: 5 (ref 5–15)
BUN: 40 mg/dL — AB (ref 6–20)
CALCIUM: 8.1 mg/dL — AB (ref 8.9–10.3)
CO2: 26 mmol/L (ref 22–32)
CREATININE: 1.61 mg/dL — AB (ref 0.61–1.24)
Chloride: 103 mmol/L (ref 101–111)
GFR calc Af Amer: 46 mL/min — ABNORMAL LOW (ref >60)
GFR, EST NON AFRICAN AMERICAN: 40 mL/min — AB (ref >60)
Glucose, Bld: 144 mg/dL — ABNORMAL HIGH (ref 65–99)
Potassium: 3.9 mmol/L (ref 3.5–5.1)
SODIUM: 134 mmol/L — AB (ref 135–145)

## 2017-10-25 LAB — CBC
HCT: 26.2 % — ABNORMAL LOW (ref 39.0–52.0)
Hemoglobin: 8.2 g/dL — ABNORMAL LOW (ref 13.0–17.0)
MCH: 25.8 pg — AB (ref 26.0–34.0)
MCHC: 31.3 g/dL (ref 30.0–36.0)
MCV: 82.4 fL (ref 78.0–100.0)
PLATELETS: 289 10*3/uL (ref 150–400)
RBC: 3.18 MIL/uL — AB (ref 4.22–5.81)
RDW: 15.9 % — ABNORMAL HIGH (ref 11.5–15.5)
WBC: 9.2 10*3/uL (ref 4.0–10.5)

## 2017-10-25 MED ORDER — HYDRALAZINE HCL 25 MG PO TABS
25.0000 mg | ORAL_TABLET | Freq: Three times a day (TID) | ORAL | Status: DC
  Administered 2017-10-25 – 2017-11-06 (×33): 25 mg via ORAL
  Filled 2017-10-25 (×38): qty 1

## 2017-10-25 NOTE — Progress Notes (Signed)
Patient has continued to scream off and on all day yesterday and today. Complains of his butt hurting and needs to poop. Miralax and senokot given as ordered. Refused dulcolax. Has had BM but does not remember having one or taking anything to help him go. Continues to ask for pain meds. Tramadol given @ 9:50, not due again until 1750. Appetite not good, picks at his food. Drinks glucerna good. Calls out to nurses station continuously. Nurse and tech in and out of room all day. Will continue to follow as needed. Rosalio Loud, RN

## 2017-10-25 NOTE — Progress Notes (Signed)
PROGRESS NOTE    Shawn Pearson  FWY:[id number] DOB: [birth year] DOA: 09/05/2017 PCP: [name], NP     Brief Narrative:  Shawn Pearson is a 77 year old malenursing home residentwho presented with chest pain and uncontrolled hypertension. Patient is known to have DM, CAD, dementia, chronic kidney disease, hypertension, stage III rectal CA, severe PVD with bilateral LE amputation. Apparently patient hadbeen refusing his medications at the skilled nursing facility. Patient was admitted to the hospital working diagnosis uncontrolled hypertension. While in hospital reported to be suicidal and homicidal.He was seen by psychiatry, recommend IV Depakoteand inpatient geripsychiatric facility. While on this admission, developed rectal bleeding 12/15. He has diagnosis of adenocarcinoma 02/2017 at time of colonoscopy and previous GI bleed 03/2017, underwent sigmoidoscopy with APC ablation to the bleeding tumor +/- radiation therapy. GI was consulted during this admission, felt there was nothing different they would offer. Hospitalization has been further complicated by patient compliance with medical care, aggression. Psych was re-consulted; patient improvement on antipsychotic, patient is now recommended for skilled nursing facility placement.  Assessment & Plan:   Principal Problem:   Rectal bleeding Active Problems:   Hypertensive urgency   CKD stage 3 secondary to diabetes (HCC)   Rectal cancer (HCC)   Chest pain   Acute on chronic anemia   Dementia   Noncompliance with medication regimen   Agitation   Elevated troponin   Malignant neoplasm of colon [facility])   Palliative care by specialist   DNR (do not resuscitate)   Rectal bleed and stage III rectal adenocarcinoma  -Originally diagnosed in 02/2017 and reportedly treated with some radiation then  -Continues to have intermittent rectal bleeding, back in 03/2017 underwent sigmoidoscopy and APC ablation  -Seen by gastroenterology this  admission, poor candidate for further treatments due to vascular dementia and significant psychiatric issues with agitation and refusal of care   Acute on chronic blood loss anemia due to above  -Status post multiple units of PRBC this admission, 1 unit on 12/15, 2 units of pRBC on 12/27, 2 units pRBC on 1/4. Patient may need intermittent transfusions due to above, poor candidate for further invasive interventions   Constipation -AXR revealed moderate stool burden. Added miralax to bowel regimen. Had BM last night.   Dementia with behavioral disturbances  -Ondivaloprexandmirtazapine, melatonin -Seen by psychiatry this admission  Hypertension -Continue amlodipine, carvedilol, clonidine changed to patch, lasix, cozaar  -BP high today, will start hydralazine PO   Chronic combined systolic/diastolic CHF -ECHO showed LVEF 40-45%, Grade 2 DD, hypokinesis of the inf wall -Lasix   Esophagitis  -Continue pepcid   Type 2 diabetes mellitus -Continue basal insulin lantus 15 U and sliding scale -Stable blood sugar   Chronic kidney disease stage III -Stable, avoid nephrotoxic medications  Acute urinary retention -Bladder scan > 492ml. Foley ordered yesterday but patient is now voiding spontaneously. Foley has not been placed. Monitor.   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  Code Status: DNR Family Communication: No family at bedside  Disposition Plan: Pending SNF placement   Consultants:   GI  Psych  Procedures:   None   Antimicrobials:  Anti-infectives (From admission, onward)   Start     Dose/Rate Route Frequency Ordered Stop   09/17/17 1000  fluconazole (DIFLUCAN)  tablet 100 mg  Status:  Discontinued     100 mg Oral  Daily 09/16/17 1313 09/21/17 1346   09/17/17 1000  fluconazole (DIFLUCAN) tablet 100 mg  Status:  Discontinued     100 mg Oral Daily 09/21/17 1346 09/28/17 1033   09/12/17 1100  fluconazole (DIFLUCAN) IVPB 100 mg  Status:  Discontinued     100 mg 50 mL/hr over 60 Minutes Intravenous Every 24 hours 09/12/17 1001 09/16/17 1313       Subjective: No acute issues    Objective: Vitals:   10/24/17 1559 10/24/17 1725 10/24/17 2222 10/25/17 0658  BP: (!) 193/74 (!) 188/70 (!) 194/76 (!) 190/61  Pulse: 65 (!) 59 65 61  Resp: 18 18 18 17   Temp: 97.7 F (36.5 C) (!) 97.5 F (36.4 C) 98.2 F (36.8 C) 98.2 F (36.8 C)  TempSrc: Oral     SpO2: 96% 100% 96% 98%  Weight:      Height:        Intake/Output Summary (Last 24 hours) at 10/25/2017 1046 Last data filed at 10/24/2017 1427 Gross per 24 hour  Intake -  Output 200 ml  Net -200 ml   Filed Weights   09/22/17 0445 10/21/17 0500 10/22/17 0643  Weight: 95.6 kg (210 lb 12.2 oz) 101.7 kg (224 lb 3.3 oz) 104.2 kg (229 lb 11.5 oz)    Examination:  General exam: Appears calm and comfortable, sleeping  Respiratory system: Clear to auscultation. Respiratory effort normal. Cardiovascular system: S1 & S2 heard, RRR. No JVD, murmurs, rubs, gallops or clicks. No pedal edema. Gastrointestinal system: Abdomen is mildly distended, soft and nontender. No organomegaly or masses felt. Normal bowel sounds heard.  Extremities: +Bilateral BKA  Skin: No rashes, lesions or ulcers on exposed skin  Psychiatry: +Dementia   Data Reviewed: I have personally reviewed following labs and imaging studies  CBC: Recent Labs  Lab 10/19/17 0606 10/23/17 0446 10/24/17 0310 10/25/17 0316  WBC 8.2 7.9 8.7 9.2  HGB 7.8* 7.0* 8.4* 8.2*  HCT 24.6* 22.4* 26.7* 26.2*  MCV 82.6 82.1 82.4 82.4  PLT 293 273 278 741   Basic Metabolic Panel: Recent Labs  Lab 10/22/17 0236 10/23/17 0446 10/24/17 0310 10/25/17 0316  NA 135 136 137 134*  K 4.5 4.3 4.1 3.9  CL  104 104 102 103  CO2 25 25 26 26   GLUCOSE 111* 129* 156* 144*  BUN 40* 44* 39* 40*  CREATININE 1.75* 1.70* 1.60* 1.61*  CALCIUM 7.7* 8.0* 8.0* 8.1*   GFR: Estimated Creatinine Clearance: 49.5 mL/min (A) (by C-G formula based on SCr of 1.61 mg/dL (H)). Liver Function Tests: No results for input(s): AST, ALT, ALKPHOS, BILITOT, PROT, ALBUMIN in the last 168 hours. No results for input(s): LIPASE, AMYLASE in the last 168 hours. No results for input(s): AMMONIA in the last 168 hours. Coagulation Profile: No results for input(s): INR, PROTIME in the last 168 hours. Cardiac Enzymes: No results for input(s): CKTOTAL, CKMB, CKMBINDEX, TROPONINI in the last 168 hours. BNP (last 3 results) No results for input(s): PROBNP in the last 8760 hours. HbA1C: No results for input(s): HGBA1C in the last 72 hours. CBG: Recent Labs  Lab 10/24/17 0801 10/24/17 1230 10/24/17 1726 10/24/17 2222 10/25/17 0815  GLUCAP 111* 215* 168* 131* 96   Lipid Profile: No results for input(s): CHOL, HDL, LDLCALC, TRIG, CHOLHDL, LDLDIRECT in the last 72 hours. Thyroid Function Tests: No results for input(s): TSH, T4TOTAL, FREET4, T3FREE, THYROIDAB in the last 72 hours. Anemia  Panel: No results for input(s): VITAMINB12, FOLATE, FERRITIN, TIBC, IRON, RETICCTPCT in the last 72 hours. Sepsis Labs: No results for input(s): PROCALCITON, LATICACIDVEN in the last 168 hours.  No results found for this or any previous visit (from the past 240 hour(s)).     Radiology Studies: Dg Abd 1 View  Result Date: 10/24/2017 CLINICAL DATA:  Constipation EXAM: ABDOMEN - 1 VIEW COMPARISON:  10/14/2017 FINDINGS: Nonobstructive bowel gas pattern. Moderate colonic stool burden. Stable calcification overlying the stomach. Cardiomegaly.  ICD leads, incompletely visualized. IMPRESSION: Moderate colonic stool burden. Electronically Signed   By: Julian Hy M.D.   On: 10/24/2017 10:53      Scheduled Meds: . amLODipine  10 mg Oral  Daily  . bisacodyl  10 mg Rectal Daily  . carvedilol  25 mg Oral BID WC  . cloNIDine  0.2 mg Transdermal Weekly  . divalproex  125 mg Oral Q12H  . famotidine  20 mg Oral QHS  . feeding supplement (GLUCERNA SHAKE)  237 mL Oral TID BM  . finasteride  5 mg Oral Daily  . furosemide  60 mg Oral Daily  . gabapentin  100 mg Oral QHS  . hydrALAZINE  25 mg Oral Q8H  . hydrocortisone   Rectal QID  . insulin aspart  0-9 Units Subcutaneous TID WC  . insulin glargine  15 Units Subcutaneous QHS  . losartan  50 mg Oral Daily  . Melatonin  3 mg Oral QHS  . mirtazapine  15 mg Oral QHS  . polyethylene glycol  17 g Oral Daily  . senna-docusate  1 tablet Oral BID  . sodium chloride flush  10-40 mL Intracatheter Q12H  . tamsulosin  0.4 mg Oral Daily   Continuous Infusions:    LOS: 49 days    Time spent: 30 minutes   Dessa Phi, DO [facility] [email or web address] Password [email or web address] 10/25/2017, 10:46 AM

## 2017-10-26 LAB — BASIC METABOLIC PANEL
ANION GAP: 6 (ref 5–15)
BUN: 38 mg/dL — AB (ref 6–20)
CHLORIDE: 103 mmol/L (ref 101–111)
CO2: 27 mmol/L (ref 22–32)
Calcium: 7.9 mg/dL — ABNORMAL LOW (ref 8.9–10.3)
Creatinine, Ser: 1.61 mg/dL — ABNORMAL HIGH (ref 0.61–1.24)
GFR, EST AFRICAN AMERICAN: 46 mL/min — AB (ref >60)
GFR, EST NON AFRICAN AMERICAN: 40 mL/min — AB (ref >60)
Glucose, Bld: 107 mg/dL — ABNORMAL HIGH (ref 65–99)
POTASSIUM: 4.2 mmol/L (ref 3.5–5.1)
SODIUM: 136 mmol/L (ref 135–145)

## 2017-10-26 LAB — GLUCOSE, CAPILLARY
GLUCOSE-CAPILLARY: 164 mg/dL — AB (ref 65–99)
GLUCOSE-CAPILLARY: 81 mg/dL (ref 65–99)
GLUCOSE-CAPILLARY: 216 mg/dL — AB (ref 65–99)
Glucose-Capillary: 199 mg/dL — ABNORMAL HIGH (ref 65–99)
Glucose-Capillary: 124 mg/dL — ABNORMAL HIGH (ref 65–99)

## 2017-10-26 LAB — CBC
HCT: 26.2 % — ABNORMAL LOW (ref 39.0–52.0)
HEMOGLOBIN: 8.2 g/dL — AB (ref 13.0–17.0)
MCH: 26 pg (ref 26.0–34.0)
MCHC: 31.3 g/dL (ref 30.0–36.0)
MCV: 83.2 fL (ref 78.0–100.0)
PLATELETS: 288 10*3/uL (ref 150–400)
RBC: 3.15 MIL/uL — AB (ref 4.22–5.81)
RDW: 16 % — ABNORMAL HIGH (ref 11.5–15.5)
WBC: 8.2 10*3/uL (ref 4.0–10.5)

## 2017-10-26 NOTE — Progress Notes (Signed)
PROGRESS NOTE    Shawn Pearson  WUX:[id number] DOB: Aug 18, 1941 DOA: 09/05/2017 PCP: [name], NP     Brief Narrative:  Shawn Pearson is a 77 year old malenursing home residentwho presented with chest pain and uncontrolled hypertension. Patient is known to have DM, CAD, dementia, chronic kidney disease, hypertension, stage III rectal CA, severe PVD with bilateral LE amputation. Apparently patient hadbeen refusing his medications at the skilled nursing facility. Patient was admitted to the hospital working diagnosis uncontrolled hypertension. While in hospital reported to be suicidal and homicidal.He was seen by psychiatry, recommend IV Depakoteand inpatient geripsychiatric facility. While on this admission, developed rectal bleeding 12/15. He has diagnosis of adenocarcinoma 02/2017 at time of colonoscopy and previous GI bleed 03/2017, underwent sigmoidoscopy with APC ablation to the bleeding tumor +/- radiation therapy. GI was consulted during this admission, felt there was nothing different they would offer. Hospitalization has been further complicated by patient compliance with medical care, aggression. Psych was re-consulted; patient improvement on antipsychotic, patient is now recommended for skilled nursing facility placement.  Assessment & Plan:   Principal Problem:   Rectal bleeding Active Problems:   Hypertensive urgency   CKD stage 3 secondary to diabetes (HCC)   Rectal cancer (HCC)   Chest pain   Acute on chronic anemia   Dementia   Noncompliance with medication regimen   Agitation   Elevated troponin   Malignant neoplasm of colon [facility])   Palliative care by specialist   DNR (do not resuscitate)   Rectal bleed and stage III rectal adenocarcinoma  -Originally diagnosed in 02/2017 and reportedly treated with some radiation then  -Continues to have intermittent rectal bleeding, back in 03/2017 underwent sigmoidoscopy and APC ablation  -Seen by gastroenterology this  admission, poor candidate for further treatments due to vascular dementia and significant psychiatric issues with agitation and refusal of care   Acute on chronic blood loss anemia due to above  -Status post multiple units of PRBC this admission, 1 unit on 12/15, 2 units of pRBC on 12/27, 2 units pRBC on 1/4. Patient may need intermittent transfusions due to above, poor candidate for further invasive interventions   Constipation -AXR revealed moderate stool burden. Continue bowel regimen. Has been having BM now   Dementia with behavioral disturbances  -Ondivaloprexandmirtazapine, melatonin -Seen by psychiatry this admission  Hypertension -Continue amlodipine, carvedilol, clonidine changed to patch, lasix, cozaar, PO hydralazine. Continue to titrate BP meds   Chronic combined systolic/diastolic CHF -ECHO showed LVEF 40-45%, Grade 2 DD, hypokinesis of the inf wall -Lasix   Esophagitis  -Continue pepcid   Type 2 diabetes mellitus -Continue basal insulin lantus 15 U and sliding scale -Stable blood sugar   Chronic kidney disease stage III -Stable, avoid nephrotoxic medications  Acute urinary retention -Resolved   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  Code Status: DNR Family Communication: No family at bedside  Disposition Plan: Pending SNF placement   Consultants:   GI  Psych  Procedures:   None   Antimicrobials:  Anti-infectives (From admission, onward)   Start     Dose/Rate Route Frequency Ordered Stop   09/17/17 1000  fluconazole (DIFLUCAN) tablet 100 mg  Status:  Discontinued     100 mg Oral Daily 09/16/17 1313 09/21/17 1346  09/17/17 1000  fluconazole (DIFLUCAN) tablet 100 mg  Status:  Discontinued      100 mg Oral Daily 09/21/17 1346 09/28/17 1033   09/12/17 1100  fluconazole (DIFLUCAN) IVPB 100 mg  Status:  Discontinued     100 mg 50 mL/hr over 60 Minutes Intravenous Every 24 hours 09/12/17 1001 09/16/17 1313       Subjective: No acute issues, no complaints of exam today   Objective: Vitals:   10/25/17 1407 10/25/17 1438 10/25/17 2247 10/26/17 0555  BP: (!) 188/82 (!) 189/75 (!) 177/66 (!) 179/55  Pulse: 60 (!) 59 63 60  Resp: 16 20 18 19   Temp:  98.4 F (36.9 C) (!) 97.4 F (36.3 C) 97.8 F (36.6 C)  TempSrc:  Oral Oral   SpO2:  95% 90% 91%  Weight:      Height:        Intake/Output Summary (Last 24 hours) at 10/26/2017 1139 Last data filed at 10/26/2017 0941 Gross per 24 hour  Intake 300 ml  Output 1400 ml  Net -1100 ml   Filed Weights   09/22/17 0445 10/21/17 0500 10/22/17 0643  Weight: 95.6 kg (210 lb 12.2 oz) 101.7 kg (224 lb 3.3 oz) 104.2 kg (229 lb 11.5 oz)    Examination:  General exam: Appears calm and comfortable  Respiratory system: Clear to auscultation. Respiratory effort normal. Cardiovascular system: S1 & S2 heard, RRR. No JVD, murmurs, rubs, gallops or clicks. No pedal edema. Gastrointestinal system: Abdomen is mildly distended, soft and nontender. No organomegaly or masses felt. Normal bowel sounds heard.  Extremities: +Bilateral BKA  Skin: No rashes, lesions or ulcers on exposed skin  Psychiatry: +Dementia   Data Reviewed: I have personally reviewed following labs and imaging studies  CBC: Recent Labs  Lab 10/23/17 0446 10/24/17 0310 10/25/17 0316 10/26/17 0404  WBC 7.9 8.7 9.2 8.2  HGB 7.0* 8.4* 8.2* 8.2*  HCT 22.4* 26.7* 26.2* 26.2*  MCV 82.1 82.4 82.4 83.2  PLT 273 278 289 295   Basic Metabolic Panel: Recent Labs  Lab 10/22/17 0236 10/23/17 0446 10/24/17 0310 10/25/17 0316 10/26/17 0404  NA 135 136 137 134* 136  K 4.5 4.3 4.1 3.9 4.2  CL 104 104 102 103 103  CO2 25 25 26 26 27   GLUCOSE 111* 129* 156* 144* 107*  BUN 40*  44* 39* 40* 38*  CREATININE 1.75* 1.70* 1.60* 1.61* 1.61*  CALCIUM 7.7* 8.0* 8.0* 8.1* 7.9*   GFR: Estimated Creatinine Clearance: 49.5 mL/min (A) (by C-G formula based on SCr of 1.61 mg/dL (H)). Liver Function Tests: No results for input(s): AST, ALT, ALKPHOS, BILITOT, PROT, ALBUMIN in the last 168 hours. No results for input(s): LIPASE, AMYLASE in the last 168 hours. No results for input(s): AMMONIA in the last 168 hours. Coagulation Profile: No results for input(s): INR, PROTIME in the last 168 hours. Cardiac Enzymes: No results for input(s): CKTOTAL, CKMB, CKMBINDEX, TROPONINI in the last 168 hours. BNP (last 3 results) No results for input(s): PROBNP in the last 8760 hours. HbA1C: No results for input(s): HGBA1C in the last 72 hours. CBG: Recent Labs  Lab 10/25/17 0815 10/25/17 1215 10/25/17 1640 10/25/17 2253 10/26/17 0737  GLUCAP 96 129* 153* 164* 81   Lipid Profile: No results for input(s): CHOL, HDL, LDLCALC, TRIG, CHOLHDL, LDLDIRECT in the last 72 hours. Thyroid Function Tests: No results for input(s): TSH, T4TOTAL, FREET4, T3FREE, THYROIDAB in the last 72 hours. Anemia Panel: No results for input(s): VITAMINB12, FOLATE, FERRITIN, TIBC, IRON,  RETICCTPCT in the last 72 hours. Sepsis Labs: No results for input(s): PROCALCITON, LATICACIDVEN in the last 168 hours.  No results found for this or any previous visit (from the past 240 hour(s)).     Radiology Studies: No results found.    Scheduled Meds: . amLODipine  10 mg Oral Daily  . bisacodyl  10 mg Rectal Daily  . carvedilol  25 mg Oral BID WC  . cloNIDine  0.2 mg Transdermal Weekly  . divalproex  125 mg Oral Q12H  . famotidine  20 mg Oral QHS  . feeding supplement (GLUCERNA SHAKE)  237 mL Oral TID BM  . finasteride  5 mg Oral Daily  . furosemide  60 mg Oral Daily  . gabapentin  100 mg Oral QHS  . hydrALAZINE  25 mg Oral Q8H  . hydrocortisone   Rectal QID  . insulin aspart  0-9 Units Subcutaneous TID  WC  . insulin glargine  15 Units Subcutaneous QHS  . losartan  50 mg Oral Daily  . Melatonin  3 mg Oral QHS  . mirtazapine  15 mg Oral QHS  . polyethylene glycol  17 g Oral Daily  . senna-docusate  1 tablet Oral BID  . sodium chloride flush  10-40 mL Intracatheter Q12H  . tamsulosin  0.4 mg Oral Daily   Continuous Infusions:    LOS: 50 days    Time spent: 30 minutes   Dessa Phi, DO [facility] [email or web address] Password [email or web address] 10/26/2017, 11:39 AM

## 2017-10-26 NOTE — Care Management Important Message (Signed)
Important Message  Patient Details  Name: Shawn Pearson MRN: [id number] Date of Birth: 30-Nov-1940   Medicare Important Message Given:  Yes    [name] May 10/26/2017, 9:32 AM

## 2017-10-27 LAB — BASIC METABOLIC PANEL
Anion gap: 8 (ref 5–15)
BUN: 39 mg/dL — ABNORMAL HIGH (ref 6–20)
CHLORIDE: 104 mmol/L (ref 101–111)
CO2: 26 mmol/L (ref 22–32)
CREATININE: 1.81 mg/dL — AB (ref 0.61–1.24)
Calcium: 7.8 mg/dL — ABNORMAL LOW (ref 8.9–10.3)
GFR calc non Af Amer: 34 mL/min — ABNORMAL LOW (ref >60)
GFR, EST AFRICAN AMERICAN: 40 mL/min — AB (ref >60)
Glucose, Bld: 124 mg/dL — ABNORMAL HIGH (ref 65–99)
POTASSIUM: 4.2 mmol/L (ref 3.5–5.1)
SODIUM: 138 mmol/L (ref 135–145)

## 2017-10-27 LAB — CBC
HCT: 24.6 % — ABNORMAL LOW (ref 39.0–52.0)
HEMOGLOBIN: 7.6 g/dL — AB (ref 13.0–17.0)
MCH: 25.9 pg — AB (ref 26.0–34.0)
MCHC: 30.9 g/dL (ref 30.0–36.0)
MCV: 84 fL (ref 78.0–100.0)
Platelets: 286 10*3/uL (ref 150–400)
RBC: 2.93 MIL/uL — AB (ref 4.22–5.81)
RDW: 16.2 % — ABNORMAL HIGH (ref 11.5–15.5)
WBC: 8 10*3/uL (ref 4.0–10.5)

## 2017-10-27 LAB — GLUCOSE, CAPILLARY
GLUCOSE-CAPILLARY: 149 mg/dL — AB (ref 65–99)
GLUCOSE-CAPILLARY: 168 mg/dL — AB (ref 65–99)

## 2017-10-27 MED ORDER — FENTANYL 12 MCG/HR TD PT72
12.5000 ug | MEDICATED_PATCH | TRANSDERMAL | Status: DC
  Administered 2017-10-27: 12.5 ug via TRANSDERMAL
  Filled 2017-10-27: qty 1

## 2017-10-27 NOTE — Progress Notes (Signed)
Pt BP 187/75, MAP 105, HR 65. Pt refusing all medication, including PRN BP medication. Will not straighten arm to allow IV access and unable to push medication with arm bent. Also refused CBG check. Notified on call MD.

## 2017-10-27 NOTE — Progress Notes (Signed)
PROGRESS NOTE    Shawn Pearson  WGY:[id number] DOB: 31-Jan-1941 DOA: 09/05/2017 PCP: [name], NP     Brief Narrative:  Shawn Pearson is a 77 year old malenursing home residentwho presented with chest pain and uncontrolled hypertension. Patient is known to have DM, CAD, dementia, chronic kidney disease, hypertension, stage III rectal CA, severe PVD with bilateral LE amputation. Apparently patient hadbeen refusing his medications at the skilled nursing facility. Patient was admitted to the hospital working diagnosis uncontrolled hypertension. While in hospital reported to be suicidal and homicidal.He was seen by psychiatry, recommend IV Depakoteand inpatient geripsychiatric facility. While on this admission, developed rectal bleeding 12/15. He has diagnosis of adenocarcinoma 02/2017 at time of colonoscopy and previous GI bleed 03/2017, underwent sigmoidoscopy with APC ablation to the bleeding tumor +/- radiation therapy. GI was consulted during this admission, felt there was nothing different they would offer. Hospitalization has been further complicated by patient compliance with medical care, aggression. Psych was re-consulted; patient improvement on antipsychotic, patient is now recommended for skilled nursing facility placement.  Assessment & Plan:   Principal Problem:   Rectal bleeding Active Problems:   Hypertensive urgency   CKD stage 3 secondary to diabetes (HCC)   Rectal cancer (HCC)   Chest pain   Acute on chronic anemia   Dementia   Noncompliance with medication regimen   Agitation   Elevated troponin   Malignant neoplasm of colon [facility])   Palliative care by specialist   DNR (do not resuscitate)   Rectal bleed and stage III rectal adenocarcinoma  -Originally diagnosed in 02/2017 and reportedly treated with some radiation then  -Continues to have intermittent rectal bleeding, back in 03/2017 underwent sigmoidoscopy and APC ablation  -Seen by gastroenterology this  admission, poor candidate for further treatments due to vascular dementia and significant psychiatric issues with agitation and refusal of care  -Added fentanyl patch for pain control   Acute on chronic blood loss anemia due to above  -Status post multiple units of PRBC this admission, 1 unit on 12/15, 2 units of pRBC on 12/27, 2 units pRBC on 1/4. Patient may need intermittent transfusions due to above, poor candidate for further invasive interventions   Constipation -AXR revealed moderate stool burden. Continue bowel regimen. Has been having BM now   Dementia with behavioral disturbances  -Ondivaloprexandmirtazapine, melatonin -Seen by psychiatry this admission. Avoid antipsychotics due to prolonged QTc   Hypertension -Continue amlodipine, carvedilol, clonidine changed to patch, lasix, cozaar, hydralazine. Continue to titrate BP meds   Chronic combined systolic/diastolic CHF -ECHO showed LVEF 40-45%, Grade 2 DD, hypokinesis of the inf wall -Lasix   Esophagitis  -Continue pepcid   Type 2 diabetes mellitus -Continue basal insulin lantus 15 U and sliding scale -Stable blood sugar   Chronic kidney disease stage III -Stable, avoid nephrotoxic medications  Acute urinary retention -Resolved   Goals of care -Has been seen by palliative care this admission and but unable complete family meeting with palliative medicine due to lack of response, unfortunately family is not actively involved. 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  Code Status: DNR Family Communication: No family at bedside  Disposition Plan: Pending SNF placement   Consultants:   GI  Psych  Procedures:   None   Antimicrobials:  Anti-infectives (From admission, onward)   Start     Dose/Rate Route Frequency Ordered Stop   09/17/17 1000  fluconazole (DIFLUCAN) tablet 100  mg  Status:  Discontinued     100 mg Oral Daily  09/16/17 1313 09/21/17 1346   09/17/17 1000  fluconazole (DIFLUCAN) tablet 100 mg  Status:  Discontinued     100 mg Oral Daily 09/21/17 1346 09/28/17 1033   09/12/17 1100  fluconazole (DIFLUCAN) IVPB 100 mg  Status:  Discontinued     100 mg 50 mL/hr over 60 Minutes Intravenous Every 24 hours 09/12/17 1001 09/16/17 1313       Subjective: Complaining of pain in rectum.    Objective: Vitals:   10/26/17 1402 10/26/17 2107 10/27/17 0457 10/27/17 0608  BP: (!) 170/66 (!) 179/75 (!) 185/67 (!) 166/70  Pulse: 61 65 78   Resp: 19 20 18    Temp: 97.6 F (36.4 C) 98.7 F (37.1 C) 98.6 F (37 C)   TempSrc: Oral Oral Oral   SpO2: 90% 94% 95%   Weight:      Height:        Intake/Output Summary (Last 24 hours) at 10/27/2017 1210 Last data filed at 10/27/2017 1119 Gross per 24 hour  Intake 220 ml  Output 1100 ml  Net -880 ml   Filed Weights   09/22/17 0445 10/21/17 0500 10/22/17 0643  Weight: 95.6 kg (210 lb 12.2 oz) 101.7 kg (224 lb 3.3 oz) 104.2 kg (229 lb 11.5 oz)    Examination:  General exam: Appears distressed, crying  Respiratory system: Clear to auscultation. Respiratory effort normal. Cardiovascular system: S1 & S2 heard, RRR. No JVD, murmurs, rubs, gallops or clicks. No pedal edema. Gastrointestinal system: Abdomen is mildly distended, soft and nontender. No organomegaly or masses felt. Normal bowel sounds heard.  Extremities: +Bilateral BKA  Skin: No rashes, lesions or ulcers on exposed skin  Psychiatry: +Dementia   Data Reviewed: I have personally reviewed following labs and imaging studies  CBC: Recent Labs  Lab 10/23/17 0446 10/24/17 0310 10/25/17 0316 10/26/17 0404 10/27/17 0420  WBC 7.9 8.7 9.2 8.2 8.0  HGB 7.0* 8.4* 8.2* 8.2* 7.6*  HCT 22.4* 26.7* 26.2* 26.2* 24.6*  MCV 82.1 82.4 82.4 83.2 84.0  PLT 273 278 289 288 188   Basic Metabolic Panel: Recent Labs  Lab 10/23/17 0446 10/24/17 0310 10/25/17 0316 10/26/17 0404 10/27/17 0420  NA 136 137 134*  136 138  K 4.3 4.1 3.9 4.2 4.2  CL 104 102 103 103 104  CO2 25 26 26 27 26   GLUCOSE 129* 156* 144* 107* 124*  BUN 44* 39* 40* 38* 39*  CREATININE 1.70* 1.60* 1.61* 1.61* 1.81*  CALCIUM 8.0* 8.0* 8.1* 7.9* 7.8*   GFR: Estimated Creatinine Clearance: 44 mL/min (A) (by C-G formula based on SCr of 1.81 mg/dL (H)). Liver Function Tests: No results for input(s): AST, ALT, ALKPHOS, BILITOT, PROT, ALBUMIN in the last 168 hours. No results for input(s): LIPASE, AMYLASE in the last 168 hours. No results for input(s): AMMONIA in the last 168 hours. Coagulation Profile: No results for input(s): INR, PROTIME in the last 168 hours. Cardiac Enzymes: No results for input(s): CKTOTAL, CKMB, CKMBINDEX, TROPONINI in the last 168 hours. BNP (last 3 results) No results for input(s): PROBNP in the last 8760 hours. HbA1C: No results for input(s): HGBA1C in the last 72 hours. CBG: Recent Labs  Lab 10/26/17 1219 10/26/17 1600 10/26/17 2102 10/27/17 0736 10/27/17 1200  GLUCAP 199* 216* 124* 149* 168*   Lipid Profile: No results for input(s): CHOL, HDL, LDLCALC, TRIG, CHOLHDL, LDLDIRECT in the last 72 hours. Thyroid Function Tests: No results for input(s):  TSH, T4TOTAL, FREET4, T3FREE, THYROIDAB in the last 72 hours. Anemia Panel: No results for input(s): VITAMINB12, FOLATE, FERRITIN, TIBC, IRON, RETICCTPCT in the last 72 hours. Sepsis Labs: No results for input(s): PROCALCITON, LATICACIDVEN in the last 168 hours.  No results found for this or any previous visit (from the past 240 hour(s)).     Radiology Studies: No results found.    Scheduled Meds: . amLODipine  10 mg Oral Daily  . bisacodyl  10 mg Rectal Daily  . carvedilol  25 mg Oral BID WC  . cloNIDine  0.2 mg Transdermal Weekly  . divalproex  125 mg Oral Q12H  . famotidine  20 mg Oral QHS  . feeding supplement (GLUCERNA SHAKE)  237 mL Oral TID BM  . fentaNYL  12.5 mcg Transdermal Q72H  . finasteride  5 mg Oral Daily  .  furosemide  60 mg Oral Daily  . gabapentin  100 mg Oral QHS  . hydrALAZINE  25 mg Oral Q8H  . hydrocortisone   Rectal QID  . insulin aspart  0-9 Units Subcutaneous TID WC  . insulin glargine  15 Units Subcutaneous QHS  . losartan  50 mg Oral Daily  . Melatonin  3 mg Oral QHS  . mirtazapine  15 mg Oral QHS  . polyethylene glycol  17 g Oral Daily  . senna-docusate  1 tablet Oral BID  . sodium chloride flush  10-40 mL Intracatheter Q12H  . tamsulosin  0.4 mg Oral Daily   Continuous Infusions:    LOS: 51 days    Time spent: 30 minutes   Dessa Phi, DO [facility] [email or web address] Password [email or web address] 10/27/2017, 12:10 PM

## 2017-10-27 NOTE — Progress Notes (Signed)
Nutrition Follow-up  DOCUMENTATION CODES:   Obesity unspecified  INTERVENTION:   Continue Glucerna Shake po TID, each supplement provides 220 kcal and 10 grams of protein  NUTRITION DIAGNOSIS:   Inadequate oral intake related to lethargy/confusion as evidenced by meal completion < 25%. -progressing  GOAL:   Patient will meet greater than or equal to 90% of their needs  progressing  MONITOR:   PO intake, Supplement acceptance, Labs, Weight trends, Skin, I & O's  ASSESSMENT:   Patient is a 77 year old male with past medical history significant for dementia, CKD, HTN , medical noncompliance wth meds, neg stress test in 2/18. Patient was admitted from SNF with chest pain, uncontrolled blood pressure due to non compliance with medication and headache.  He says he does not like the nursing home he is at and that is why he refuses to take his meds. The patient has been non co operative with the Nursing staff, and still refusing his medication. Psych has been consulted as patient likely has dementia with behavioral problems.   12/1- remeron initiated by psychiatry in attempt to stimulate appetite and decrease agitation 12/3- Ensure supplement changed to Glucerna by nursing staff due to increased CBGS 12/23- hypoglycemic event (CBG 63) 12/26- remeron dose increased (7.5 mg to 15 mg) by psych in attempt to improve irritability and appetite stimulation 01/04 - Palliative contacted that family coming to visit, tried to call family and got voicemail. PMT signed off 12/28 due to being unable to contact family. Wife lives 2 hrs away  Continues to consume glucerna. PO intake varying from 40-100% Pt continues to refuse care from time to time.  Labs reviewed:  CBGs 168, 149, 124  Medications reviewed and include:  Dulcolax, Insulin, Remeron, Melatonin, Miralax   Intake/Output Summary (Last 24 hours) at 10/27/2017 1259 Last data filed at 10/27/2017 1119 Gross per 24 hour  Intake 220 ml   Output 1100 ml  Net -880 ml  8.9L Fluid Positive     Diet Order:  Diet Carb Modified Fluid consistency: Thin; Room service appropriate? Yes  EDUCATION NEEDS:   Not appropriate for education at this time  Skin:  Skin Assessment: Skin Integrity Issues: Skin Integrity Issues:: Other (Comment) Other: MSAD buttocks and groin  Last BM:  10/27/2017 (type 6)  Height:   Ht Readings from Last 1 Encounters:  09/06/17 6\' 2"  (1.88 m)    Weight:   Wt Readings from Last 1 Encounters:  10/22/17 229 lb 11.5 oz (104.2 kg)    Ideal Body Weight:  75.1 kg  BMI:  Body mass index is 29.49 kg/m.  Estimated Nutritional Needs:   Kcal:  1650-1850  Protein:  80-95 grams  Fluid:  1.6-1.8 L  Parks Ranger, MS, RDN, LDN 10/27/2017 12:59 PM

## 2017-10-27 NOTE — Progress Notes (Signed)
CSW submitted referral to "[facility]" and [facility] as requested by Market researcher.   Percell Locus Primus Gritton LCSWA [phone]

## 2017-10-28 LAB — CBC
HEMATOCRIT: 23.9 % — AB (ref 39.0–52.0)
Hemoglobin: 7.6 g/dL — ABNORMAL LOW (ref 13.0–17.0)
MCH: 26.8 pg (ref 26.0–34.0)
MCHC: 31.8 g/dL (ref 30.0–36.0)
MCV: 84.2 fL (ref 78.0–100.0)
Platelets: 271 10*3/uL (ref 150–400)
RBC: 2.84 MIL/uL — ABNORMAL LOW (ref 4.22–5.81)
RDW: 16.4 % — AB (ref 11.5–15.5)
WBC: 7 10*3/uL (ref 4.0–10.5)

## 2017-10-28 LAB — BASIC METABOLIC PANEL
Anion gap: 6 (ref 5–15)
BUN: 39 mg/dL — AB (ref 6–20)
CHLORIDE: 104 mmol/L (ref 101–111)
CO2: 27 mmol/L (ref 22–32)
Calcium: 7.9 mg/dL — ABNORMAL LOW (ref 8.9–10.3)
Creatinine, Ser: 1.79 mg/dL — ABNORMAL HIGH (ref 0.61–1.24)
GFR calc Af Amer: 40 mL/min — ABNORMAL LOW (ref >60)
GFR calc non Af Amer: 35 mL/min — ABNORMAL LOW (ref >60)
Glucose, Bld: 119 mg/dL — ABNORMAL HIGH (ref 65–99)
POTASSIUM: 4.2 mmol/L (ref 3.5–5.1)
SODIUM: 137 mmol/L (ref 135–145)

## 2017-10-28 LAB — GLUCOSE, CAPILLARY
GLUCOSE-CAPILLARY: 122 mg/dL — AB (ref 65–99)
Glucose-Capillary: 73 mg/dL (ref 65–99)
Glucose-Capillary: 140 mg/dL — ABNORMAL HIGH (ref 65–99)
Glucose-Capillary: 173 mg/dL — ABNORMAL HIGH (ref 65–99)

## 2017-10-28 MED ORDER — TRAMADOL HCL 50 MG PO TABS
50.0000 mg | ORAL_TABLET | Freq: Three times a day (TID) | ORAL | Status: DC | PRN
  Administered 2017-10-28 – 2017-11-04 (×6): 50 mg via ORAL
  Filled 2017-10-28 (×9): qty 1

## 2017-10-28 MED ORDER — FENTANYL CITRATE (PF) 100 MCG/2ML IJ SOLN
25.0000 ug | Freq: Once | INTRAMUSCULAR | Status: AC
  Administered 2017-10-28: 25 ug via INTRAVENOUS
  Filled 2017-10-28: qty 2

## 2017-10-28 MED ORDER — OXYCODONE HCL 5 MG PO TABS
5.0000 mg | ORAL_TABLET | Freq: Four times a day (QID) | ORAL | Status: DC | PRN
  Administered 2017-10-28 – 2017-11-02 (×13): 5 mg via ORAL
  Filled 2017-10-28 (×17): qty 1

## 2017-10-28 MED ORDER — FENTANYL 12 MCG/HR TD PT72
25.0000 ug | MEDICATED_PATCH | TRANSDERMAL | Status: DC
  Administered 2017-10-31 – 2017-11-03 (×2): 25 ug via TRANSDERMAL
  Filled 2017-10-28 (×2): qty 2

## 2017-10-28 NOTE — Progress Notes (Signed)
Pt crying out, complaining that he is in pain "in the bottom of the stomach" and stating that he feels like he needs to have a bowel movement.  Cleaned up a smear BM prior to administering pain meds, and paged provider [place], who ordered a tap water enema X1.  Will continue to monitor.

## 2017-10-28 NOTE — Progress Notes (Signed)
Pt screaming out that he is in pain, states his abdomen hurts. Administered PRN tramadol and educated pt that he must wait for medicine to work. Pt continues to scream. Also applied hot pack to pt's abdomen. Will continue to monitor.

## 2017-10-28 NOTE — Progress Notes (Signed)
Paged MD on call to notify regarding pt's ongoing screaming and pain. Administered 25 mcg fentanyl per order. Pt continues to scream. Educated pt that he must give the medicine time to work and that he should try to relax.

## 2017-10-28 NOTE — Progress Notes (Signed)
PROGRESS NOTE    Shawn Pearson  KDX:[id number] DOB: 1941/06/03 DOA: 09/05/2017 PCP: [name], NP     Brief Narrative:  Shawn Pearson is a 77 year old malenursing home residentwho presented with chest pain and uncontrolled hypertension. Patient is known to have DM, CAD, dementia, chronic kidney disease, hypertension, stage III rectal CA, severe PVD with bilateral LE amputation. Apparently patient hadbeen refusing his medications at the skilled nursing facility. Patient was admitted to the hospital working diagnosis uncontrolled hypertension. While in hospital reported to be suicidal and homicidal.He was seen by psychiatry, recommend IV Depakoteand inpatient geripsychiatric facility. While on this admission, developed rectal bleeding 12/15. He has diagnosis of adenocarcinoma 02/2017 at time of colonoscopy and previous GI bleed 03/2017, underwent sigmoidoscopy with APC ablation to the bleeding tumor +/- radiation therapy. GI was consulted during this admission, felt there was nothing different they would offer. Hospitalization has been further complicated by patient compliance with medical care, aggression. Psych was re-consulted; patient improvement on antipsychotic, patient is now recommended for skilled nursing facility placement.  Assessment & Plan:   Rectal bleed and stage III rectal adenocarcinoma  -Originally diagnosed in 02/2017 and reportedly treated with some radiation then  -Continues to have intermittent rectal bleeding, back in 03/2017 underwent sigmoidoscopy and APC ablation  -Seen by gastroenterology this admission, poor candidate for further treatments due to vascular dementia and significant psychiatric issues with agitation and refusal of care  -Added fentanyl patch for pain control  -recommended Hospice/comfort focused care to family  Acute on chronic blood loss anemia due to above  -Status post multiple units of PRBC this admission, 1 unit on 12/15, 2 units of pRBC on  12/27, 2 units pRBC on 1/4. Patient may need intermittent transfusions due to above, poor candidate for further invasive interventions   Constipation -AXR revealed moderate stool burden. Continue bowel regimen. Has been having BM now   Dementia with behavioral disturbances  -Ondivaloprexandmirtazapine, melatonin -Seen by psychiatry this admission. Avoid antipsychotics due to prolonged QTc   Hypertension -Continue amlodipine, carvedilol, clonidine changed to patch, lasix, cozaar, hydralazine. Continue to titrate BP meds   Chronic combined systolic/diastolic CHF -ECHO showed LVEF 40-45%, Grade 2 DD, hypokinesis of the inf wall -Lasix   Esophagitis  -Continue pepcid   Type 2 diabetes mellitus -Continue basal insulin lantus 15 U and sliding scale -Stable blood sugar   Chronic kidney disease stage III -Stable, avoid nephrotoxic medications  Acute urinary retention -Resolved   Goals of care -Has been seen by palliative care this admission and but unable complete family meeting with palliative medicine due to lack of response, unfortunately family is not actively involved.  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  Code Status: DNR Family Communication: No family at bedside, I called and d/w wife last week, requested Family meeting Disposition Plan: Pending SNF placement   Consultants:   GI  Psych  Procedures:   None   Antimicrobials:  Anti-infectives (From admission, onward)   Start     Dose/Rate Route Frequency Ordered Stop   09/17/17 1000  fluconazole (DIFLUCAN) tablet 100 mg  Status:  Discontinued     100 mg Oral Daily 09/16/17 1313 09/21/17 1346   09/17/17 1000  fluconazole (DIFLUCAN) tablet 100 mg  Status:  Discontinued     100 mg Oral Daily 09/21/17 1346 09/28/17 1033   09/12/17 1100  fluconazole (DIFLUCAN) IVPB 100 mg  Status:  Discontinued      100 mg 50 mL/hr over 60 Minutes Intravenous Every 24 hours 09/12/17 1001 09/16/17 1313       Subjective: -complains of pain all over    Objective: Vitals:   10/27/17 2201 10/28/17 0452 10/28/17 0921 10/28/17 1328  BP: (!) 187/75 (!) 178/72 (!) 180/71 (!) 166/84  Pulse: 65 67 60 (!) 59  Resp: 18 18  16   Temp: 98.5 F (36.9 C) 98.6 F (37 C)  97.7 F (36.5 C)  TempSrc:  Oral  Oral  SpO2: 97% 100%  100%  Weight:      Height:        Intake/Output Summary (Last 24 hours) at 10/28/2017 1422 Last data filed at 10/28/2017 1115 Gross per 24 hour  Intake 410 ml  Output -  Net 410 ml   Filed Weights   09/22/17 0445 10/21/17 0500 10/22/17 0643  Weight: 95.6 kg (210 lb 12.2 oz) 101.7 kg (224 lb 3.3 oz) 104.2 kg (229 lb 11.5 oz)    Examination:  Gen: Awake, Alert, Oriented X 2, no distress HEENT: PERRLA, Neck supple, no JVD Lungs: Good air movement bilaterally, CTAB CVS: RRR,No Gallops,Rubs or new Murmurs Abd: soft, Non tender, non distended, BS present Extremities: B/L BKA Skin: no new rashes Psychiatry: +Dementia   Data Reviewed: I have personally reviewed following labs and imaging studies  CBC: Recent Labs  Lab 10/24/17 0310 10/25/17 0316 10/26/17 0404 10/27/17 0420 10/28/17 0418  WBC 8.7 9.2 8.2 8.0 7.0  HGB 8.4* 8.2* 8.2* 7.6* 7.6*  HCT 26.7* 26.2* 26.2* 24.6* 23.9*  MCV 82.4 82.4 83.2 84.0 84.2  PLT 278 289 288 286 443   Basic Metabolic Panel: Recent Labs  Lab 10/24/17 0310 10/25/17 0316 10/26/17 0404 10/27/17 0420 10/28/17 0418  NA 137 134* 136 138 137  K 4.1 3.9 4.2 4.2 4.2  CL 102 103 103 104 104  CO2 26 26 27 26 27   GLUCOSE 156* 144* 107* 124* 119*  BUN 39* 40* 38* 39* 39*  CREATININE 1.60* 1.61* 1.61* 1.81* 1.79*  CALCIUM 8.0* 8.1* 7.9* 7.8* 7.9*   GFR: Estimated Creatinine Clearance: 44.5 mL/min (A) (by C-G formula based on SCr of 1.79 mg/dL (H)). Liver Function Tests: No results for input(s): AST, ALT, ALKPHOS, BILITOT, PROT, ALBUMIN  in the last 168 hours. No results for input(s): LIPASE, AMYLASE in the last 168 hours. No results for input(s): AMMONIA in the last 168 hours. Coagulation Profile: No results for input(s): INR, PROTIME in the last 168 hours. Cardiac Enzymes: No results for input(s): CKTOTAL, CKMB, CKMBINDEX, TROPONINI in the last 168 hours. BNP (last 3 results) No results for input(s): PROBNP in the last 8760 hours. HbA1C: No results for input(s): HGBA1C in the last 72 hours. CBG: Recent Labs  Lab 10/26/17 2102 10/27/17 0736 10/27/17 1200 10/28/17 0806 10/28/17 1200  GLUCAP 124* 149* 168* 73 140*   Lipid Profile: No results for input(s): CHOL, HDL, LDLCALC, TRIG, CHOLHDL, LDLDIRECT in the last 72 hours. Thyroid Function Tests: No results for input(s): TSH, T4TOTAL, FREET4, T3FREE, THYROIDAB in the last 72 hours. Anemia Panel: No results for input(s): VITAMINB12, FOLATE, FERRITIN, TIBC, IRON, RETICCTPCT in the last 72 hours. Sepsis Labs: No results for input(s): PROCALCITON, LATICACIDVEN in the last 168 hours.  No results found for this or any previous visit (from the past 240 hour(s)).     Radiology Studies: No results found.    Scheduled Meds: . amLODipine  10 mg Oral Daily  .  bisacodyl  10 mg Rectal Daily  . carvedilol  25 mg Oral BID WC  . cloNIDine  0.2 mg Transdermal Weekly  . divalproex  125 mg Oral Q12H  . famotidine  20 mg Oral QHS  . feeding supplement (GLUCERNA SHAKE)  237 mL Oral TID BM  . fentaNYL  25 mcg Transdermal Q72H  . finasteride  5 mg Oral Daily  . furosemide  60 mg Oral Daily  . gabapentin  100 mg Oral QHS  . hydrALAZINE  25 mg Oral Q8H  . hydrocortisone   Rectal QID  . insulin aspart  0-9 Units Subcutaneous TID WC  . insulin glargine  15 Units Subcutaneous QHS  . losartan  50 mg Oral Daily  . Melatonin  3 mg Oral QHS  . mirtazapine  15 mg Oral QHS  . polyethylene glycol  17 g Oral Daily  . senna-docusate  1 tablet Oral BID  . sodium chloride flush   10-40 mL Intracatheter Q12H  . tamsulosin  0.4 mg Oral Daily   Continuous Infusions:    LOS: 52 days    Time spent: 30 minutes   Domenic Polite, MD [facility] [email or web address] Password [email or web address] 10/28/2017, 2:22 PM

## 2017-10-28 NOTE — Progress Notes (Signed)
CM requested per MD to arrange a family meeting with MD. CM attempted call to pt's wife Mickel Baas @ [phone]. Call was unsuccessful and was unable to leave voice message. However, CM was able to speak with daughter Georgina Quint @ [phone]. Sirena stated mom is divorced from dad and will  not make any decisions on his behalf. States she will not be up to see her dad any time soon but you can call her regarding his condition. Whitman Hero RN,BSN,CM

## 2017-10-29 LAB — GLUCOSE, CAPILLARY
GLUCOSE-CAPILLARY: 131 mg/dL — AB (ref 65–99)
GLUCOSE-CAPILLARY: 140 mg/dL — AB (ref 65–99)
GLUCOSE-CAPILLARY: 175 mg/dL — AB (ref 65–99)
GLUCOSE-CAPILLARY: 117 mg/dL — AB (ref 65–99)

## 2017-10-29 LAB — VITAMIN B12: Vitamin B-12: 460 pg/mL (ref 180–914)

## 2017-10-29 LAB — FERRITIN: FERRITIN: 191 ng/mL (ref 24–336)

## 2017-10-29 LAB — FOLATE: Folate: 21.5 ng/mL (ref >5.9)

## 2017-10-29 LAB — RETICULOCYTES
RBC.: 2.96 MIL/uL — AB (ref 4.22–5.81)
RETIC COUNT ABSOLUTE: 20.7 10*3/uL (ref 19.0–186.0)
RETIC CT PCT: 0.7 % (ref 0.4–3.1)

## 2017-10-29 LAB — IRON AND TIBC
Iron: 20 ug/dL — ABNORMAL LOW (ref 45–182)
Saturation Ratios: 8 % — ABNORMAL LOW (ref 17.9–39.5)
TIBC: 249 ug/dL — ABNORMAL LOW (ref 250–450)
UIBC: 229 ug/dL

## 2017-10-29 NOTE — Progress Notes (Signed)
CSW awaiting call from [facility] regarding decision.   Shawn Pearson LCSWA [phone]

## 2017-10-29 NOTE — Progress Notes (Signed)
PROGRESS NOTE    Shawn Pearson  NGE:[id number] DOB: 04-Jul-1941 DOA: 09/05/2017 PCP: [name], NP     Brief Narrative:  Shawn Pearson is a 77 year old malenursing home residentwho presented with chest pain and uncontrolled hypertension. Patient is known to have DM, CAD, dementia, chronic kidney disease, hypertension, stage III rectal CA, severe PVD with bilateral LE amputation. Apparently patient hadbeen refusing his medications at the skilled nursing facility. Patient was admitted to the hospital working diagnosis uncontrolled hypertension. While in hospital reported to be suicidal and homicidal.He was seen by psychiatry, recommend IV Depakoteand inpatient geripsychiatric facility. While on this admission, developed rectal bleeding 12/15. He has diagnosis of adenocarcinoma 02/2017 at time of colonoscopy and previous GI bleed 03/2017, underwent sigmoidoscopy with APC ablation to the bleeding tumor +/- radiation therapy. GI was consulted during this admission, felt there was nothing different they would offer. Hospitalization has been further complicated by patient compliance with medical care, aggression. Psych was re-consulted; patient improvement on antipsychotic, patient is now recommended for skilled nursing facility placement.  Assessment & Plan:   Rectal bleed and stage III rectal adenocarcinoma  -Originally diagnosed in 02/2017 and reportedly treated with some radiation then  -Continues to have intermittent rectal bleeding, back in 03/2017 underwent sigmoidoscopy and APC ablation  -Seen by gastroenterology this admission, poor candidate for further treatments due to vascular dementia and significant psychiatric issues with agitation and refusal of care  -Added fentanyl patch for pain control  -We have recommended Hospice/comfort focused care to family  Acute on chronic blood loss anemia due to above  -Status post multiple units of PRBC this admission, 1 unit on 12/15, 2 units of  pRBC on 12/27, 2 units pRBC on 1/4. Patient will need intermittent transfusions due to above, poor candidate for further invasive interventions  -recheck anemia panel, will probably benefit from Iron infusion  Constipation -AXR revealed moderate stool burden. Continue bowel regimen. Has been having BM now  -continue stool softeners and laxatives daily  Dementia with behavioral disturbances  -Ondivaloprexandmirtazapine, melatonin -Seen by psychiatry this admission. Avoid antipsychotics due to prolonged QTc   Hypertension -Continue amlodipine, carvedilol, clonidine changed to patch, lasix, cozaar, hydralazine. Continue to titrate BP meds   Chronic combined systolic/diastolic CHF -ECHO showed LVEF 40-45%, Grade 2 DD, hypokinesis of the inf wall -euvolemic, continue PO Lasix   Esophagitis  -Continue pepcid   Type 2 diabetes mellitus -Continue basal insulin lantus 15 U and sliding scale -Stable blood sugar   Chronic kidney disease stage III -Stable, avoid nephrotoxic medications  Acute urinary retention -Resolved   Goals of care -Has been seen by palliative care this admission and but unable complete family meeting with palliative medicine due to lack of response, unfortunately family is not actively involved.  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  Code Status: DNR Family Communication: No family at bedside, I called and d/w wife last week, requested Family meeting, or or left message for the daughter again this morning 1/10  Disposition Plan: Pending SNF  placement   Consultants:   GI  Psych  Procedures:   None   Antimicrobials:  Anti-infectives (From admission, onward)   Start     Dose/Rate Route Frequency Ordered Stop   09/17/17 1000  fluconazole (DIFLUCAN) tablet 100 mg  Status:  Discontinued     100 mg Oral Daily 09/16/17 1313 09/21/17 1346   09/17/17 1000  fluconazole (DIFLUCAN) tablet 100 mg  Status:  Discontinued     100 mg Oral Daily 09/21/17 1346 09/28/17 1033   09/12/17 1100  fluconazole (DIFLUCAN) IVPB 100 mg  Status:  Discontinued     100 mg 50 mL/hr over 60 Minutes Intravenous Every 24 hours 09/12/17 1001 09/16/17 1313       Subjective: -complains of pain all over    Objective: Vitals:   10/29/17 0017 10/29/17 0056 10/29/17 0549 10/29/17 0904  BP: (!) 188/78 (!) 176/64 (!) 189/78 (!) 169/65  Pulse:  60 60 63  Resp:   20   Temp:   (!) 97.5 F (36.4 C)   TempSrc:   Oral   SpO2:   99%   Weight:      Height:        Intake/Output Summary (Last 24 hours) at 10/29/2017 1300 Last data filed at 10/29/2017 0910 Gross per 24 hour  Intake 40 ml  Output 420 ml  Net -380 ml   Filed Weights   09/22/17 0445 10/21/17 0500 10/22/17 0643  Weight: 95.6 kg (210 lb 12.2 oz) 101.7 kg (224 lb 3.3 oz) 104.2 kg (229 lb 11.5 oz)    Examination:  Gen: Awake, Alert, Oriented X 2, no distress HEENT: PERRLA, Neck supple, no JVD Lungs: Good air movement bilaterally, CTAB CVS: RRR,No Gallops,Rubs or new Murmurs Abd: soft, Non tender, non distended, BS present Extremities: B/L BKA Skin: no new rashes Psychiatry: +Dementia   Data Reviewed: I have personally reviewed following labs and imaging studies  CBC: Recent Labs  Lab 10/24/17 0310 10/25/17 0316 10/26/17 0404 10/27/17 0420 10/28/17 0418  WBC 8.7 9.2 8.2 8.0 7.0  HGB 8.4* 8.2* 8.2* 7.6* 7.6*  HCT 26.7* 26.2* 26.2* 24.6* 23.9*  MCV 82.4 82.4 83.2 84.0 84.2  PLT 278 289 288 286 096   Basic Metabolic Panel: Recent Labs  Lab 10/24/17 0310 10/25/17 0316  10/26/17 0404 10/27/17 0420 10/28/17 0418  NA 137 134* 136 138 137  K 4.1 3.9 4.2 4.2 4.2  CL 102 103 103 104 104  CO2 26 26 27 26 27   GLUCOSE 156* 144* 107* 124* 119*  BUN 39* 40* 38* 39* 39*  CREATININE 1.60* 1.61* 1.61* 1.81* 1.79*  CALCIUM 8.0* 8.1* 7.9* 7.8* 7.9*   GFR: Estimated Creatinine Clearance: 44.5 mL/min (A) (by C-G formula based on SCr of 1.79 mg/dL (H)). Liver Function Tests: No results for input(s): AST, ALT, ALKPHOS, BILITOT, PROT, ALBUMIN in the last 168 hours. No results for input(s): LIPASE, AMYLASE in the last 168 hours. No results for input(s): AMMONIA in the last 168 hours. Coagulation Profile: No results for input(s): INR, PROTIME in the last 168 hours. Cardiac Enzymes: No results for input(s): CKTOTAL, CKMB, CKMBINDEX, TROPONINI in the last 168 hours. BNP (last 3 results) No results for input(s): PROBNP in the last 8760 hours. HbA1C: No results for input(s): HGBA1C in the last 72 hours. CBG: Recent Labs  Lab 10/28/17 1703 10/28/17 2140 10/29/17 0801 10/29/17 0841 10/29/17 1208  GLUCAP 122* 173* 131* 140* 175*   Lipid Profile: No results for input(s): CHOL, HDL, LDLCALC, TRIG, CHOLHDL, LDLDIRECT in the last 72 hours. Thyroid Function Tests:  No results for input(s): TSH, T4TOTAL, FREET4, T3FREE, THYROIDAB in the last 72 hours. Anemia Panel: No results for input(s): VITAMINB12, FOLATE, FERRITIN, TIBC, IRON, RETICCTPCT in the last 72 hours. Sepsis Labs: No results for input(s): PROCALCITON, LATICACIDVEN in the last 168 hours.  No results found for this or any previous visit (from the past 240 hour(s)).     Radiology Studies: No results found.    Scheduled Meds: . amLODipine  10 mg Oral Daily  . bisacodyl  10 mg Rectal Daily  . carvedilol  25 mg Oral BID WC  . cloNIDine  0.2 mg Transdermal Weekly  . divalproex  125 mg Oral Q12H  . famotidine  20 mg Oral QHS  . feeding supplement (GLUCERNA SHAKE)  237 mL Oral TID BM  . fentaNYL  25  mcg Transdermal Q72H  . finasteride  5 mg Oral Daily  . furosemide  60 mg Oral Daily  . gabapentin  100 mg Oral QHS  . hydrALAZINE  25 mg Oral Q8H  . hydrocortisone   Rectal QID  . insulin aspart  0-9 Units Subcutaneous TID WC  . insulin glargine  15 Units Subcutaneous QHS  . losartan  50 mg Oral Daily  . Melatonin  3 mg Oral QHS  . mirtazapine  15 mg Oral QHS  . polyethylene glycol  17 g Oral Daily  . senna-docusate  1 tablet Oral BID  . sodium chloride flush  10-40 mL Intracatheter Q12H  . tamsulosin  0.4 mg Oral Daily   Continuous Infusions:    LOS: 53 days    Time spent: 30 minutes   Domenic Polite, MD [facility] [email or web address] Password [email or web address] 10/29/2017, 1:00 PM

## 2017-10-29 NOTE — Progress Notes (Signed)
Pt refused blood sugar check and medications,

## 2017-10-30 LAB — GLUCOSE, CAPILLARY
GLUCOSE-CAPILLARY: 125 mg/dL — AB (ref 65–99)
GLUCOSE-CAPILLARY: 200 mg/dL — AB (ref 65–99)
GLUCOSE-CAPILLARY: 208 mg/dL — AB (ref 65–99)

## 2017-10-30 NOTE — Progress Notes (Signed)
Pt. Very agitated.  Screaming, cursing and saying he wants to leave.  He is refusing all medications.  He is refusing to get cleaned up.  He pulled off condom catheter and is refusing to have one put back on.

## 2017-10-30 NOTE — Progress Notes (Signed)
Pt refused blood sugar check, afternoon VS check and meds. Refused to let NT clean him up. Continues to yell out. Stated he felt like something was pulling on his neck. Gown was loosely tied around neck and this RN explained nothing was pulling on him. Pt said he wanted it all off and then pulled off gown, yanked condom catheter off and started beating call bell on bedside table. Pt told this RN not to "play dumb." This RN attempted to talk with Pt to figure out what he wanted and tried to calm Pt down, talking calmly and asking what I could do to help him. Pt would not tell this RN what he wanted or needed. Pt sat silently with eyes closed. Will continue to monitor.

## 2017-10-30 NOTE — Progress Notes (Signed)
CSW left voicemail for [employer] with [employer] Keslyn Teater LCSWA [phone]

## 2017-10-30 NOTE — Progress Notes (Signed)
PROGRESS NOTE    Shawn Pearson  NIO:[id number] DOB: 20-Mar-1941 DOA: 09/05/2017 PCP: [name], NP     Brief Narrative:  Shawn Pearson is a 77 year old malenursing home residentwho presented with chest pain and uncontrolled hypertension. Patient is known to have DM, CAD, dementia, chronic kidney disease, hypertension, stage III rectal CA, severe PVD with bilateral LE amputation. Apparently patient hadbeen refusing his medications at the skilled nursing facility. Patient was admitted to the hospital working diagnosis uncontrolled hypertension. While in hospital reported to be suicidal and homicidal.He was seen by psychiatry, recommend IV Depakoteand inpatient geripsychiatric facility. While on this admission, developed rectal bleeding 12/15. He has diagnosis of adenocarcinoma 02/2017 at time of colonoscopy and previous GI bleed 03/2017, underwent sigmoidoscopy with APC ablation to the bleeding tumor +/- radiation therapy. GI was consulted during this admission, felt there was nothing different they would offer. Hospitalization has been further complicated by patient compliance with medical care, aggression. Psych was re-consulted; patient improvement on antipsychotic, patient is now recommended for skilled nursing facility placement.  Assessment & Plan:   Rectal bleed and stage III rectal adenocarcinoma  -Originally diagnosed in 02/2017 and reportedly treated with some radiation then  -Continues to have intermittent rectal bleeding, back in 03/2017 underwent sigmoidoscopy and APC ablation  -Seen by gastroenterology this admission, poor candidate for further treatments due to vascular dementia and significant psychiatric issues with agitation and refusal of care  -now on fentanyl patch for pain control  -We have recommended Hospice/comfort focused care to family  Acute on chronic blood loss anemia due to above  -Status post multiple units of PRBC this admission, 1 unit on 12/15, 2 units of  PRBC on 12/27, 2 units pRBC on 1/4. Patient will need intermittent transfusions due to above, poor candidate for further invasive interventions  -`anemia panel with iron deficiency, will benefit from Iron infusion, too agitated to get this today  Constipation -AXR revealed moderate stool burden. Continue bowel regimen. Has been having BM now  -continue stool softeners and laxatives daily  Dementia with behavioral disturbances  -Ondivaloprexandmirtazapine, melatonin -Seen by psychiatry this admission. Avoid antipsychotics due to prolonged QTc   Hypertension -Continue amlodipine, carvedilol, clonidine changed to patch, lasix, cozaar, hydralazine. Continue to titrate BP meds   Chronic combined systolic/diastolic CHF -ECHO showed LVEF 40-45%, Grade 2 DD, hypokinesis of the inf wall -euvolemic, continue PO Lasix   Esophagitis  -Continue pepcid   Type 2 diabetes mellitus -Continue basal insulin lantus 15 U and sliding scale -Stable blood sugar   Chronic kidney disease stage III -Stable, avoid nephrotoxic medications  Acute urinary retention -Resolved   Goals of care -Has been seen by palliative care this admission and but unable complete family meeting with palliative medicine due to lack of response, unfortunately family is not actively involved.  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  Code Status: DNR Family Communication: No family at bedside, I called and d/w wife last week, requested Family meeting, or or left message for the daughter 1/10 and d/w daughter  today 1/11 Disposition Plan: Pending SNF placement  Consultants:   GI  Psych  Procedures:   None   Antimicrobials:  Anti-infectives (From admission, onward)   Start     Dose/Rate Route Frequency Ordered Stop   09/17/17 1000  fluconazole (DIFLUCAN) tablet 100 mg  Status:  Discontinued     100 mg Oral Daily 09/16/17 1313 09/21/17 1346   09/17/17 1000  fluconazole (DIFLUCAN) tablet 100 mg  Status:  Discontinued     100 mg Oral Daily 09/21/17 1346 09/28/17 1033   09/12/17 1100  fluconazole (DIFLUCAN) IVPB 100 mg  Status:  Discontinued     100 mg 50 mL/hr over 60 Minutes Intravenous Every 24 hours 09/12/17 1001 09/16/17 1313       Subjective: -complains of pain all over    Objective: Vitals:   10/29/17 1326 10/30/17 0018 10/30/17 0517 10/30/17 0611  BP: (!) 168/64 (!) 172/63 (!) 190/56 (!) 177/60  Pulse: 60 67 66 64  Resp: 18 17 19    Temp: 98.3 F (36.8 C) 98.7 F (37.1 C) 98.2 F (36.8 C)   TempSrc: Oral Oral    SpO2: 92% 95% 92%   Weight:      Height:        Intake/Output Summary (Last 24 hours) at 10/30/2017 1409 Last data filed at 10/30/2017 0900 Gross per 24 hour  Intake 406 ml  Output -  Net 406 ml   Filed Weights   09/22/17 0445 10/21/17 0500 10/22/17 0643  Weight: 95.6 kg (210 lb 12.2 oz) 101.7 kg (224 lb 3.3 oz) 104.2 kg (229 lb 11.5 oz)    Examination:  Gen: Awake, Alert, Oriented X 2, no distress HEENT: PERRLA, Neck supple, no JVD Lungs: Good air movement bilaterally, CTAB CVS: RRR,No Gallops,Rubs or new Murmurs Abd: soft, Non tender, non distended, BS present Extremities: B/L BKA Skin: no new rashes Psychiatry: +Dementia   Data Reviewed: I have personally reviewed following labs and imaging studies  CBC: Recent Labs  Lab 10/24/17 0310 10/25/17 0316 10/26/17 0404 10/27/17 0420 10/28/17 0418  WBC 8.7 9.2 8.2 8.0 7.0  HGB 8.4* 8.2* 8.2* 7.6* 7.6*  HCT 26.7* 26.2* 26.2* 24.6* 23.9*  MCV 82.4 82.4 83.2 84.0 84.2  PLT 278 289 288 286  427   Basic Metabolic Panel: Recent Labs  Lab 10/24/17 0310 10/25/17 0316 10/26/17 0404 10/27/17 0420 10/28/17 0418  NA 137 134* 136 138 137  K 4.1 3.9 4.2 4.2 4.2  CL 102 103 103 104 104  CO2 26 26 27 26 27   GLUCOSE 156* 144* 107* 124* 119*  BUN 39* 40* 38* 39* 39*  CREATININE 1.60* 1.61* 1.61* 1.81* 1.79*  CALCIUM 8.0* 8.1* 7.9* 7.8* 7.9*   GFR: Estimated Creatinine Clearance: 44.5 mL/min (A) (by C-G formula based on SCr of 1.79 mg/dL (H)). Liver Function Tests: No results for input(s): AST, ALT, ALKPHOS, BILITOT, PROT, ALBUMIN in the last 168 hours. No results for input(s): LIPASE, AMYLASE in the last 168 hours. No results for input(s): AMMONIA in the last 168 hours. Coagulation Profile: No results for input(s): INR, PROTIME in the last 168 hours. Cardiac Enzymes: No results for input(s): CKTOTAL, CKMB, CKMBINDEX, TROPONINI in the last 168 hours. BNP (last 3 results) No results for input(s): PROBNP in the last 8760 hours. HbA1C: No results for input(s): HGBA1C in the last 72 hours. CBG: Recent Labs  Lab 10/29/17 0801 10/29/17 0841 10/29/17 1208 10/29/17 2144 10/30/17 0742  GLUCAP 131* 140* 175* 117* 125*   Lipid Profile: No results for input(s): CHOL, HDL, LDLCALC,  TRIG, CHOLHDL, LDLDIRECT in the last 72 hours. Thyroid Function Tests: No results for input(s): TSH, T4TOTAL, FREET4, T3FREE, THYROIDAB in the last 72 hours. Anemia Panel: Recent Labs    10/29/17 2221  VITAMINB12 460  FOLATE 21.5  FERRITIN 191  TIBC 249*  IRON 20*  RETICCTPCT 0.7   Sepsis Labs: No results for input(s): PROCALCITON, LATICACIDVEN in the last 168 hours.  No results found for this or any previous visit (from the past 240 hour(s)).     Radiology Studies: No results found.    Scheduled Meds: . amLODipine  10 mg Oral Daily  . bisacodyl  10 mg Rectal Daily  . carvedilol  25 mg Oral BID WC  . cloNIDine  0.2 mg Transdermal Weekly  . divalproex  125 mg Oral Q12H  .  famotidine  20 mg Oral QHS  . feeding supplement (GLUCERNA SHAKE)  237 mL Oral TID BM  . fentaNYL  25 mcg Transdermal Q72H  . finasteride  5 mg Oral Daily  . furosemide  60 mg Oral Daily  . gabapentin  100 mg Oral QHS  . hydrALAZINE  25 mg Oral Q8H  . hydrocortisone   Rectal QID  . insulin aspart  0-9 Units Subcutaneous TID WC  . insulin glargine  15 Units Subcutaneous QHS  . losartan  50 mg Oral Daily  . Melatonin  3 mg Oral QHS  . mirtazapine  15 mg Oral QHS  . polyethylene glycol  17 g Oral Daily  . senna-docusate  1 tablet Oral BID  . sodium chloride flush  10-40 mL Intracatheter Q12H  . tamsulosin  0.4 mg Oral Daily   Continuous Infusions:    LOS: 54 days    Time spent: 25 minutes   Domenic Polite, MD [facility] [email or web address] Password [email or web address] 10/30/2017, 2:09 PM

## 2017-10-30 NOTE — Care Management Important Message (Signed)
Important Message  Patient Details  Name: Shawn Pearson MRN: [id number] Date of Birth: 11-25-1940   Medicare Important Message Given:  Yes    [name] May 10/30/2017, 9:28 AM

## 2017-10-30 NOTE — Progress Notes (Signed)
Patient refused dsg change to L midline.Patient upset a this time.Bedside RN aware.

## 2017-10-31 LAB — GLUCOSE, CAPILLARY
GLUCOSE-CAPILLARY: 248 mg/dL — AB (ref 65–99)
GLUCOSE-CAPILLARY: 110 mg/dL — AB (ref 65–99)
Glucose-Capillary: 141 mg/dL — ABNORMAL HIGH (ref 65–99)
Glucose-Capillary: 213 mg/dL — ABNORMAL HIGH (ref 65–99)

## 2017-10-31 MED ORDER — SODIUM CHLORIDE 0.9 % IV SOLN
510.0000 mg | Freq: Once | INTRAVENOUS | Status: AC
  Administered 2017-10-31: 510 mg via INTRAVENOUS
  Filled 2017-10-31: qty 17

## 2017-10-31 NOTE — Progress Notes (Signed)
PROGRESS NOTE    Shawn Pearson  PJK:[id number] DOB: 08-Jul-1941 DOA: 09/05/2017 PCP: [name], NP     Brief Narrative:  Shawn Pearson is a 77 year old malenursing home residentwho presented with chest pain and uncontrolled hypertension. Patient is known to have DM, CAD, dementia, chronic kidney disease, hypertension, stage III rectal CA, severe PVD with bilateral LE amputation. Apparently patient hadbeen refusing his medications at the skilled nursing facility. Patient was admitted to the hospital working diagnosis uncontrolled hypertension. While in hospital reported to be suicidal and homicidal.He was seen by psychiatry, recommend IV Depakoteand inpatient geripsychiatric facility. While on this admission, developed rectal bleeding 12/15. He has diagnosis of adenocarcinoma 02/2017 at time of colonoscopy and previous GI bleed 03/2017, underwent sigmoidoscopy with APC ablation to the bleeding tumor +/- radiation therapy. GI was consulted during this admission, felt there was nothing different they would offer. Hospitalization has been further complicated by patient compliance with medical care, aggression. Psych was re-consulted; patient improvement on antipsychotic, patient is now recommended for skilled nursing facility placement.  Assessment & Plan:   Rectal bleed and stage III rectal adenocarcinoma  -Originally diagnosed in 02/2017 and reportedly treated with some radiation then  -Continues to have intermittent rectal bleeding, back in 03/2017 underwent sigmoidoscopy and APC ablation  -Seen by gastroenterology this admission, poor candidate for further treatments due to vascular dementia and significant psychiatric issues with agitation and refusal of care  -now on fentanyl patch for pain control,  continue stool softeners/laxatives and hydrocortisone rectal cream -We have recommended comfort focused care to family  Acute on chronic blood loss anemia due to above  -Status post  multiple units of PRBC this admission, 1 unit on 12/15, 2 units of PRBC on 12/27, 2 units pRBC on 1/4. Patient will need intermittent transfusions due to above, poor candidate for further invasive interventions  -anemia panel with iron deficiency -We will attempt IV iron infusion today -Check CBC in a.m.  Constipation -Resolved -Having almost daily bowel movements now, continue current laxative regimen  Dementia with behavioral disturbances  -Ondivaloprexandmirtazapine, melatonin -Seen by psychiatry this admission. Avoid antipsychotics due to prolonged QTc   Hypertension -Continue amlodipine, carvedilol, clonidine changed to patch, lasix, cozaar, hydralazine. Continue to titrate BP meds   Chronic combined systolic/diastolic CHF -ECHO showed LVEF 40-45%, Grade 2 DD, hypokinesis of the inf wall -euvolemic, continue PO Lasix   Esophagitis  -Continue pepcid   Type 2 diabetes mellitus -Continue basal insulin lantus 15 U and sliding scale -Stable blood sugar   Chronic kidney disease stage III -Stable, avoid nephrotoxic medications  Acute urinary retention -Resolved   Goals of care -Has been seen by palliative care this admission and but unable complete family meeting with palliative medicine due to lack of response, unfortunately family is not actively involved.  -requested Family meeting multiple times, on Wednesday case manager was able to reach his daughter who reported that they will not be coming to the hospital and seemed somewhat estranged from the patient, daughter also told us that the person or listed as his spouse is his ex-wife, they are divorced and she does not wish to have any contact or make any decisions regarding him. -Called and discussed situation with daughter yesterday, she refused to come to the hospital  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  Code Status: DNR Family Communication: No family at  bedside, I called and d/w wife last week, requested Family meeting, or or left message  for the daughter 1/10 and d/w daughter today 1/11 Disposition Plan: Pending SNF placement   Consultants:   GI  Psych  Procedures:   None   Antimicrobials:  Anti-infectives (From admission, onward)   Start     Dose/Rate Route Frequency Ordered Stop   09/17/17 1000  fluconazole (DIFLUCAN) tablet 100 mg  Status:  Discontinued     100 mg Oral Daily 09/16/17 1313 09/21/17 1346   09/17/17 1000  fluconazole (DIFLUCAN) tablet 100 mg  Status:  Discontinued     100 mg Oral Daily 09/21/17 1346 09/28/17 1033   09/12/17 1100  fluconazole (DIFLUCAN) IVPB 100 mg  Status:  Discontinued     100 mg 50 mL/hr over 60 Minutes Intravenous Every 24 hours 09/12/17 1001 09/16/17 1313       Subjective: -complains of pain all over    Objective: Vitals:   10/30/17 2119 10/30/17 2203 10/31/17 0125 10/31/17 0531  BP: (!) 186/60 (!) 182/55 (!) 167/51 (!) 158/82  Pulse: 61 60 (!) 59 60  Resp: 18 18 19 18   Temp: 98.6 F (37 C) 98.1 F (36.7 C) 98.5 F (36.9 C) 98.4 F (36.9 C)  TempSrc: Oral  Oral   SpO2: 95% 93% 96% 100%  Weight:    102.1 kg (225 lb 1.4 oz)  Height:        Intake/Output Summary (Last 24 hours) at 10/31/2017 1213 Last data filed at 10/31/2017 0530 Gross per 24 hour  Intake 232 ml  Output 1600 ml  Net -1368 ml   Filed Weights   10/21/17 0500 10/22/17 0643 10/31/17 0531  Weight: 101.7 kg (224 lb 3.3 oz) 104.2 kg (229 lb 11.5 oz) 102.1 kg (225 lb 1.4 oz)    Examination:  Gen: Awake, Alert, Oriented X 2, pleasant this morning HEENT: PERRLA, Neck supple, no JVD Lungs: Good air movement bilaterally, CTAB CVS: RRR,No Gallops,Rubs or new Murmurs Abd: soft, Non tender, non distended, BS present Extremities: Bilateral below-knee amputations Skin: no new rashes Psychiatry: +Dementia   Data Reviewed: I have personally reviewed following labs and imaging studies  CBC: Recent Labs  Lab  10/25/17 0316 10/26/17 0404 10/27/17 0420 10/28/17 0418  WBC 9.2 8.2 8.0 7.0  HGB 8.2* 8.2* 7.6* 7.6*  HCT 26.2* 26.2* 24.6* 23.9*  MCV 82.4 83.2 84.0 84.2  PLT 289 288 286 470   Basic Metabolic Panel: Recent Labs  Lab 10/25/17 0316 10/26/17 0404 10/27/17 0420 10/28/17 0418  NA 134* 136 138 137  K 3.9 4.2 4.2 4.2  CL 103 103 104 104  CO2 26 27 26 27   GLUCOSE 144* 107* 124* 119*  BUN 40* 38* 39* 39*  CREATININE 1.61* 1.61* 1.81* 1.79*  CALCIUM 8.1* 7.9* 7.8* 7.9*   GFR: Estimated Creatinine Clearance: 44.1 mL/min (A) (by C-G formula based on SCr of 1.79 mg/dL (H)). Liver Function Tests: No results for input(s): AST, ALT, ALKPHOS, BILITOT, PROT, ALBUMIN in the last 168 hours. No results for input(s): LIPASE, AMYLASE in the last 168 hours. No results for input(s): AMMONIA in the last 168 hours. Coagulation Profile: No results for input(s): INR, PROTIME in the last 168 hours. Cardiac Enzymes: No results for input(s): CKTOTAL, CKMB, CKMBINDEX, TROPONINI in the last 168 hours. BNP (last 3 results) No results for input(s): PROBNP in the last 8760 hours. HbA1C: No results for input(s): HGBA1C in the last 72 hours. CBG: Recent Labs  Lab 10/29/17 2144 10/30/17 0742 10/30/17 1653 10/30/17 2123 10/31/17 0749  GLUCAP 117* 125* 200* 208* 141*  Lipid Profile: No results for input(s): CHOL, HDL, LDLCALC, TRIG, CHOLHDL, LDLDIRECT in the last 72 hours. Thyroid Function Tests: No results for input(s): TSH, T4TOTAL, FREET4, T3FREE, THYROIDAB in the last 72 hours. Anemia Panel: Recent Labs    10/29/17 2221  VITAMINB12 460  FOLATE 21.5  FERRITIN 191  TIBC 249*  IRON 20*  RETICCTPCT 0.7   Sepsis Labs: No results for input(s): PROCALCITON, LATICACIDVEN in the last 168 hours.  No results found for this or any previous visit (from the past 240 hour(s)).     Radiology Studies: No results found.    Scheduled Meds: . amLODipine  10 mg Oral Daily  . bisacodyl  10 mg  Rectal Daily  . carvedilol  25 mg Oral BID WC  . cloNIDine  0.2 mg Transdermal Weekly  . divalproex  125 mg Oral Q12H  . famotidine  20 mg Oral QHS  . feeding supplement (GLUCERNA SHAKE)  237 mL Oral TID BM  . fentaNYL  25 mcg Transdermal Q72H  . finasteride  5 mg Oral Daily  . furosemide  60 mg Oral Daily  . gabapentin  100 mg Oral QHS  . hydrALAZINE  25 mg Oral Q8H  . hydrocortisone   Rectal QID  . insulin aspart  0-9 Units Subcutaneous TID WC  . insulin glargine  15 Units Subcutaneous QHS  . losartan  50 mg Oral Daily  . Melatonin  3 mg Oral QHS  . mirtazapine  15 mg Oral QHS  . polyethylene glycol  17 g Oral Daily  . senna-docusate  1 tablet Oral BID  . sodium chloride flush  10-40 mL Intracatheter Q12H  . tamsulosin  0.4 mg Oral Daily   Continuous Infusions: . ferumoxytol       LOS: 55 days    Time spent: 25 minutes   Domenic Polite, MD [facility] [email or web address] Password [email or web address] 10/31/2017, 12:13 PM

## 2017-11-01 LAB — CBC
HEMATOCRIT: 24.1 % — AB (ref 39.0–52.0)
HEMOGLOBIN: 7.5 g/dL — AB (ref 13.0–17.0)
MCH: 25.9 pg — ABNORMAL LOW (ref 26.0–34.0)
MCHC: 31.1 g/dL (ref 30.0–36.0)
MCV: 83.1 fL (ref 78.0–100.0)
Platelets: 301 10*3/uL (ref 150–400)
RBC: 2.9 MIL/uL — ABNORMAL LOW (ref 4.22–5.81)
RDW: 15.9 % — ABNORMAL HIGH (ref 11.5–15.5)
WBC: 6.9 10*3/uL (ref 4.0–10.5)

## 2017-11-01 LAB — BASIC METABOLIC PANEL
ANION GAP: 8 (ref 5–15)
BUN: 37 mg/dL — ABNORMAL HIGH (ref 6–20)
CO2: 26 mmol/L (ref 22–32)
Calcium: 8 mg/dL — ABNORMAL LOW (ref 8.9–10.3)
Chloride: 103 mmol/L (ref 101–111)
Creatinine, Ser: 1.79 mg/dL — ABNORMAL HIGH (ref 0.61–1.24)
GFR calc non Af Amer: 35 mL/min — ABNORMAL LOW (ref >60)
GFR, EST AFRICAN AMERICAN: 40 mL/min — AB (ref >60)
GLUCOSE: 235 mg/dL — AB (ref 65–99)
POTASSIUM: 4.2 mmol/L (ref 3.5–5.1)
Sodium: 137 mmol/L (ref 135–145)

## 2017-11-01 LAB — GLUCOSE, CAPILLARY
GLUCOSE-CAPILLARY: 258 mg/dL — AB (ref 65–99)
GLUCOSE-CAPILLARY: 246 mg/dL — AB (ref 65–99)
GLUCOSE-CAPILLARY: 140 mg/dL — AB (ref 65–99)
Glucose-Capillary: 111 mg/dL — ABNORMAL HIGH (ref 65–99)

## 2017-11-01 NOTE — Progress Notes (Signed)
PROGRESS NOTE    Shawn Pearson  DZH:[id number] DOB: 19-Sep-1941 DOA: 09/05/2017 PCP: [name], NP     Brief Narrative:  Shawn Pearson is a 77 year old malenursing home residentwho presented with chest pain and uncontrolled hypertension. Patient is known to have DM, CAD, dementia, chronic kidney disease, hypertension, stage III rectal CA, severe PVD with bilateral LE amputation. Apparently patient hadbeen refusing his medications at the skilled nursing facility. Patient was admitted to the hospital working diagnosis uncontrolled hypertension. While in hospital reported to be suicidal and homicidal.He was seen by psychiatry, recommend IV Depakoteand inpatient geripsychiatric facility. While on this admission, developed rectal bleeding 12/15. He has diagnosis of adenocarcinoma 02/2017 at time of colonoscopy and previous GI bleed 03/2017, underwent sigmoidoscopy with APC ablation to the bleeding tumor +/- radiation therapy. GI was consulted during this admission, felt there was nothing different they would offer. Hospitalization has been further complicated by patient compliance with medical care, aggression. Psych was re-consulted; patient improvement on antipsychotic, patient is now recommended for skilled nursing facility placement. -Social work following, difficult placement  Assessment & Plan:   Rectal bleed and stage III rectal adenocarcinoma  -Originally diagnosed in 02/2017 and reportedly treated with some radiation then  -Continues to have intermittent rectal bleeding, back in 03/2017 underwent sigmoidoscopy and APC ablation  -Seen by gastroenterology this admission, poor candidate for further treatments due to vascular dementia and significant psychiatric issues with agitation and refusal of care  -now on fentanyl patch for pain control,  continue stool softeners/laxatives and hydrocortisone rectal cream -recommended comfort focused care to family  Acute on chronic blood loss  anemia due to above  -Status post multiple units of PRBC this admission, 1 unit on 12/15, 2 units of PRBC on 12/27, 2 units pRBC on 1/4. Patient will need intermittent transfusions due to above, poor candidate for further invasive interventions  -anemia panel with iron deficiency -Given IV iron 1/12 -Hemoglobin relatively stable in the 7.5-8 range, monitor periodically, will give iron weekly  Constipation -Resolved -Having almost daily bowel movements now, continue current laxative regimen  Dementia with behavioral disturbances  -Ondivaloprexandmirtazapine, melatonin -Seen by psychiatry this admission. Avoid antipsychotics due to prolonged QTc   Hypertension -Continue amlodipine, carvedilol, clonidine changed to patch, lasix, cozaar, hydralazine. Continue to titrate BP meds   Chronic combined systolic/diastolic CHF -ECHO showed LVEF 40-45%, Grade 2 DD, hypokinesis of the inf wall -euvolemic, continue PO Lasix   Esophagitis  -Continue pepcid   Type 2 diabetes mellitus -Continue basal insulin lantus 15 U and sliding scale -Stable blood sugar   Chronic kidney disease stage III -Stable, avoid nephrotoxic medications  Acute urinary retention -Resolved   Goals of care -Has been seen by palliative care this admission and but unable complete family meeting with palliative medicine due to lack of response, unfortunately family is not actively involved.  -requested Family meeting multiple times, on Wednesday case manager was able to reach his daughter who reported that they will not be coming to the hospital and seemed somewhat estranged from the patient, daughter also told us that the person or listed as his spouse is his ex-wife, they are divorced and she does not wish to have any contact or make any decisions regarding him. 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 due to bilateral BKA  Code Status: DNR Family Communication: No family at bedside, I called and d/w wife  last week, requested Family meeting, or or left message for the daughter 1/10 and d/w daughter  1/11 Disposition Plan: Pending SNF placement   Consultants:   GI  Psych  Procedures:   None   Antimicrobials:  Anti-infectives (From admission, onward)   Start     Dose/Rate Route Frequency Ordered Stop   09/17/17 1000  fluconazole (DIFLUCAN) tablet 100 mg  Status:  Discontinued     100 mg Oral Daily 09/16/17 1313 09/21/17 1346   09/17/17 1000  fluconazole (DIFLUCAN) tablet 100 mg  Status:  Discontinued     100 mg Oral Daily 09/21/17 1346 09/28/17 1033   09/12/17 1100  fluconazole (DIFLUCAN) IVPB 100 mg  Status:  Discontinued     100 mg 50 mL/hr over 60 Minutes Intravenous Every 24 hours 09/12/17 1001 09/16/17 1313       Subjective: -In better spirits today, has mild discomfort in his rectum during defecation   Objective: Vitals:   10/31/17 1251 10/31/17 2114 10/31/17 2114 11/01/17 0632  BP: (!) 194/69 (!) 194/76 (!) 194/76 (!) 175/48  Pulse: 74 65 65 67  Resp:  19 18 19   Temp: 98.2 F (36.8 C) 98.1 F (36.7 C) 98.1 F (36.7 C) 98.1 F (36.7 C)  TempSrc: Oral Oral Oral Oral  SpO2: 98% 98% 98% 90%  Weight:      Height:        Intake/Output Summary (Last 24 hours) at 11/01/2017 1133 Last data filed at 10/31/2017 1500 Gross per 24 hour  Intake 1191 ml  Output -  Net 1191 ml   Filed Weights   10/21/17 0500 10/22/17 0643 10/31/17 0531  Weight: 101.7 kg (224 lb 3.3 oz) 104.2 kg (229 lb 11.5 oz) 102.1 kg (225 lb 1.4 oz)    Examination:  Gen: Awake, Alert, Oriented X 2, remains pleasant this morning HEENT: PERRLA, Neck supple, no JVD Lungs: Good air movement bilaterally, CTAB CVS: RRR,No Gallops,Rubs or new Murmurs Abd: soft, Non tender, non distended, BS present Extremities: Bilateral below-knee amputation Skin: no new rashes Psychiatry: +Dementia    Data Reviewed: I have personally reviewed following labs and imaging studies  CBC: Recent Labs  Lab 10/26/17 0404 10/27/17 0420 10/28/17 0418 11/01/17 0500  WBC 8.2 8.0 7.0 6.9  HGB 8.2* 7.6* 7.6* 7.5*  HCT 26.2* 24.6* 23.9* 24.1*  MCV 83.2 84.0 84.2 83.1  PLT 288 286 271 301   Basic Metabolic Panel: Recent Labs  Lab 10/26/17 0404 10/27/17 0420 10/28/17 0418 11/01/17 0500  NA 136 138 137 137  K 4.2 4.2 4.2 4.2  CL 103 104 104 103  CO2 27 26 27 26   GLUCOSE 107* 124* 119* 235*  BUN 38* 39* 39* 37*  CREATININE 1.61* 1.81* 1.79* 1.79*  CALCIUM 7.9* 7.8* 7.9* 8.0*   GFR: Estimated Creatinine Clearance: 44.1 mL/min (A) (by C-G formula based on SCr of 1.79 mg/dL (H)). Liver Function Tests: No results for input(s): AST, ALT, ALKPHOS, BILITOT, PROT, ALBUMIN in the last 168 hours. No results for input(s): LIPASE, AMYLASE in the last 168 hours. No results for input(s): AMMONIA in the last 168 hours. Coagulation Profile: No results for input(s): INR, PROTIME in the last 168 hours. Cardiac Enzymes: No results for input(s): CKTOTAL, CKMB, CKMBINDEX, TROPONINI in the last 168 hours. BNP (last 3 results) No results for input(s): PROBNP in the last 8760 hours. HbA1C: No results for input(s): HGBA1C in the last 72 hours. CBG: Recent Labs  Lab 10/31/17 0749 10/31/17 1248 10/31/17 1719 10/31/17 2220  11/01/17 0815  GLUCAP 141* 248* 213* 110* 111*   Lipid Profile: No results for input(s): CHOL, HDL, LDLCALC, TRIG, CHOLHDL, LDLDIRECT in the last 72 hours. Thyroid Function Tests: No results for input(s): TSH, T4TOTAL, FREET4, T3FREE, THYROIDAB in the last 72 hours. Anemia Panel: Recent Labs    10/29/17 2221  VITAMINB12 460  FOLATE 21.5  FERRITIN 191  TIBC 249*  IRON 20*  RETICCTPCT 0.7   Sepsis Labs: No results for input(s): PROCALCITON, LATICACIDVEN in the last 168 hours.  No results found for this or any previous visit (from the past 240 hour(s)).      Radiology Studies: No results found.    Scheduled Meds: . amLODipine  10 mg Oral Daily  . bisacodyl  10 mg Rectal Daily  . carvedilol  25 mg Oral BID WC  . cloNIDine  0.2 mg Transdermal Weekly  . divalproex  125 mg Oral Q12H  . famotidine  20 mg Oral QHS  . feeding supplement (GLUCERNA SHAKE)  237 mL Oral TID BM  . fentaNYL  25 mcg Transdermal Q72H  . finasteride  5 mg Oral Daily  . furosemide  60 mg Oral Daily  . gabapentin  100 mg Oral QHS  . hydrALAZINE  25 mg Oral Q8H  . hydrocortisone   Rectal QID  . insulin aspart  0-9 Units Subcutaneous TID WC  . insulin glargine  15 Units Subcutaneous QHS  . losartan  50 mg Oral Daily  . Melatonin  3 mg Oral QHS  . mirtazapine  15 mg Oral QHS  . polyethylene glycol  17 g Oral Daily  . senna-docusate  1 tablet Oral BID  . sodium chloride flush  10-40 mL Intracatheter Q12H  . tamsulosin  0.4 mg Oral Daily   Continuous Infusions:    LOS: 56 days    Time spent: 25 minutes   Domenic Polite, MD [facility] [email or web address] Password [facility] 11/01/2017, 11:33 AM

## 2017-11-02 LAB — GLUCOSE, CAPILLARY
Glucose-Capillary: 115 mg/dL — ABNORMAL HIGH (ref 65–99)
Glucose-Capillary: 186 mg/dL — ABNORMAL HIGH (ref 65–99)

## 2017-11-02 MED ORDER — SODIUM CHLORIDE 0.9 % IV SOLN: 510.0000 mg | Freq: Once | INTRAVENOUS | Status: DC

## 2017-11-02 MED ORDER — SODIUM CHLORIDE 0.9 % IV SOLN: 500.0000 mg | INTRAVENOUS | Status: DC

## 2017-11-02 MED ORDER — OXYCODONE HCL 5 MG PO TABS
5.0000 mg | ORAL_TABLET | Freq: Four times a day (QID) | ORAL | Status: DC | PRN
  Administered 2017-11-02 – 2017-11-06 (×8): 5 mg via ORAL
  Filled 2017-11-02 (×11): qty 1

## 2017-11-02 MED ORDER — SODIUM CHLORIDE 0.9 % IV SOLN: 25.0000 mg | Freq: Once | INTRAVENOUS | Status: DC

## 2017-11-02 NOTE — Progress Notes (Addendum)
Pt refused all medications at this time, saying that his "throat hurts". Will not allow me to assess it, and will not take a sip of anything. Refused breathing treatment as well. Informed pt that he would not get his pain medicine either if he did not allow me to give him anything. Pt continued to refuse all medicine. Pt also refused CBG check and would not allow condom catheter to be reapplied. Will continue to monitor and assess.

## 2017-11-02 NOTE — Progress Notes (Signed)
Pt refused recheck of BP after IV hydralazine given.

## 2017-11-02 NOTE — Progress Notes (Signed)
CSW continuing to follow up with facilities.   Percell Locus Kianni Lheureux LCSWA [phone]

## 2017-11-02 NOTE — Care Management Important Message (Signed)
Important Message  Patient Details  Name: Shawn Pearson MRN: [id number] Date of Birth: [birth year]   Medicare Important Message Given:  Yes    [name] [name] 11/02/2017, 4:21 PM

## 2017-11-02 NOTE — Progress Notes (Signed)
PROGRESS NOTE    Shawn Pearson  XBJ:[id number] DOB: Mar 16, 1941 DOA: 09/05/2017 PCP: [name], NP     Brief Narrative:  Shawn Pearson is a 77 year old malenursing home residentwho presented with chest pain and uncontrolled hypertension. Patient is known to have DM, CAD, dementia, chronic kidney disease, hypertension, stage III rectal CA, severe PVD with bilateral LE amputation. Apparently patient hadbeen refusing his medications at the skilled nursing facility. Patient was admitted to the hospital working diagnosis uncontrolled hypertension. While in hospital reported to be suicidal and homicidal.He was seen by psychiatry, recommend IV Depakoteand inpatient geripsychiatric facility. While on this admission, developed rectal bleeding 12/15. He has diagnosis of adenocarcinoma 02/2017 at time of colonoscopy and previous GI bleed 03/2017, underwent sigmoidoscopy with APC ablation to the bleeding tumor +/- radiation therapy. GI was consulted during this admission, felt there was nothing different they would offer. Hospitalization has been further complicated by patient compliance with medical care, aggression. Psych was re-consulted; patient improvement on antipsychotic, patient is now recommended for skilled nursing facility placement. -Social work following, difficult placement  Assessment & Plan:   Rectal bleed/stage III rectal adenocarcinoma  -Originally diagnosed in 02/2017 and reportedly treated with some radiation then at [place] to have intermittent rectal bleeding, back in 03/2017 underwent sigmoidoscopy and APC ablation  -Seen by gastroenterology this admission, poor candidate for further treatments due to vascular dementia and significant psychiatric issues with agitation and refusal of care  -now on fentanyl patch for pain control,  continue stool softeners/laxatives and hydrocortisone rectal cream -recommended supportive care to family and FU with Oncology at [place] on chronic blood loss anemia due to above  -Status post multiple units of PRBC this admission, 1 unit on 12/15, 2 units of PRBC on 12/27, 2 units pRBC on 1/4. Patient will need intermittent transfusions due to above, poor candidate for further invasive interventions  -anemia panel with iron deficiency -Given IV iron 1/12 -Hemoglobin relatively stable in the 7.5-8 range, monitor periodically, will give iron weekly -check CBC on wednesday  Constipation -Resolved -Having almost daily bowel movements now, continue current laxative regimen  Dementia with behavioral disturbances  -Ondivaloprexandmirtazapine, melatonin -Seen by psychiatry this admission. Avoid antipsychotics due to prolonged QTc   Hypertension -Continue amlodipine, carvedilol, clonidine changed to patch, lasix, cozaar, hydralazine. Continue to titrate BP meds   Chronic combined systolic/diastolic CHF -ECHO showed LVEF 40-45%, Grade 2 DD, hypokinesis of the inf wall -euvolemic, continue PO Lasix   Esophagitis  -Continue pepcid   Type 2 diabetes mellitus -Continue basal insulin lantus 15 U and sliding scale -Stable blood sugar   Chronic kidney disease stage III -Stable, avoid nephrotoxic medications  Acute urinary retention -Resolved   Goals of care -Has been seen by palliative care this admission and but unable complete family meeting with palliative medicine due to lack of response, unfortunately family is not actively involved.  -requested Family meeting multiple times, on Wednesday case manager was able to reach his daughter who reported that they will not be coming to the hospital and seemed somewhat estranged from the patient, daughter also told us that the person or listed as his spouse is his ex-wife, they are divorced and she does not wish to have any contact or make any decisions regarding him. 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  Code Status: DNR Family  Communication: No family at bedside, I called and d/w wife last week, requested Family meeting, or or left message for the daughter 1/10 and d/w daughter  1/12 Disposition Plan: Pending SNF placement   Consultants:   GI  Psych  Procedures:   None   Antimicrobials:  Anti-infectives (From admission, onward)   Start     Dose/Rate Route Frequency Ordered Stop   09/17/17 1000  fluconazole (DIFLUCAN) tablet 100 mg  Status:  Discontinued     100 mg Oral Daily 09/16/17 1313 09/21/17 1346   09/17/17 1000  fluconazole (DIFLUCAN) tablet 100 mg  Status:  Discontinued     100 mg Oral Daily 09/21/17 1346 09/28/17 1033   09/12/17 1100  fluconazole (DIFLUCAN) IVPB 100 mg  Status:  Discontinued     100 mg 50 mL/hr over 60 Minutes Intravenous Every 24 hours 09/12/17 1001 09/16/17 1313       Subjective: -sleeping now, per RN was awake earlier, ate some breakfast, had a BM with some blood   Objective: Vitals:   11/02/17 0120 11/02/17 0500 11/02/17 0829 11/02/17 1446  BP: (!) 187/113 (!) 188/60 (!) 184/63 (!) 171/65  Pulse: 70 66 64 61  Resp:    20  Temp:  97.7 F (36.5 C)  98.4 F (36.9 C)  TempSrc:  Oral  Oral  SpO2:  98%  91%  Weight:      Height:       No intake or output data in the 24 hours ending 11/02/17 1537 Filed Weights   10/21/17 0500 10/22/17 0643 10/31/17 0531  Weight: 101.7 kg (224 lb 3.3 oz) 104.2 kg (229 lb 11.5 oz) 102.1 kg (225 lb 1.4 oz)    Examination:   Gen: somnolent, easily arousable, no distress HEENT: PERRLA, Neck supple, no JVD Lungs: Good air movement bilaterally, CTAB CVS: RRR,No Gallops,Rubs or new Murmurs Abd: soft, Non tender, non distended, BS present Extremities: B/L BKA, some edema Skin: no new rashes Psychiatry: +Dementia   Data Reviewed: I have personally reviewed following labs and imaging studies  CBC: Recent Labs  Lab  10/27/17 0420 10/28/17 0418 11/01/17 0500  WBC 8.0 7.0 6.9  HGB 7.6* 7.6* 7.5*  HCT 24.6* 23.9* 24.1*  MCV 84.0 84.2 83.1  PLT 286 271 947   Basic Metabolic Panel: Recent Labs  Lab 10/27/17 0420 10/28/17 0418 11/01/17 0500  NA 138 137 137  K 4.2 4.2 4.2  CL 104 104 103  CO2 26 27 26   GLUCOSE 124* 119* 235*  BUN 39* 39* 37*  CREATININE 1.81* 1.79* 1.79*  CALCIUM 7.8* 7.9* 8.0*   GFR: Estimated Creatinine Clearance: 44.1 mL/min (A) (by C-G formula based on SCr of 1.79 mg/dL (H)). Liver Function Tests: No results for input(s): AST, ALT, ALKPHOS, BILITOT, PROT, ALBUMIN in the last 168 hours. No results for input(s): LIPASE, AMYLASE in the last 168 hours. No results for input(s): AMMONIA in the last 168 hours. Coagulation Profile: No results for input(s): INR, PROTIME in the last 168 hours. Cardiac Enzymes: No results for input(s): CKTOTAL, CKMB, CKMBINDEX, TROPONINI in the last 168 hours. BNP (last 3 results) No results for input(s): PROBNP in the last 8760 hours. HbA1C: No results for input(s): HGBA1C in the last 72 hours. CBG: Recent Labs  Lab 11/01/17 1331 11/01/17 1658 11/01/17 2150 11/02/17 0821 11/02/17 1218  GLUCAP 258* 246* 140* 115* 186*   Lipid Profile: No results for input(s): CHOL, HDL, LDLCALC, TRIG, CHOLHDL, LDLDIRECT in the last 72 hours. Thyroid Function Tests:  No results for input(s): TSH, T4TOTAL, FREET4, T3FREE, THYROIDAB in the last 72 hours. Anemia Panel: No results for input(s): VITAMINB12, FOLATE, FERRITIN, TIBC, IRON, RETICCTPCT in the last 72 hours. Sepsis Labs: No results for input(s): PROCALCITON, LATICACIDVEN in the last 168 hours.  No results found for this or any previous visit (from the past 240 hour(s)).     Radiology Studies: No results found.    Scheduled Meds: . amLODipine  10 mg Oral Daily  . bisacodyl  10 mg Rectal Daily  . carvedilol  25 mg Oral BID WC  . cloNIDine  0.2 mg Transdermal Weekly  . divalproex  125  mg Oral Q12H  . famotidine  20 mg Oral QHS  . feeding supplement (GLUCERNA SHAKE)  237 mL Oral TID BM  . fentaNYL  25 mcg Transdermal Q72H  . finasteride  5 mg Oral Daily  . furosemide  60 mg Oral Daily  . gabapentin  100 mg Oral QHS  . hydrALAZINE  25 mg Oral Q8H  . hydrocortisone   Rectal QID  . insulin aspart  0-9 Units Subcutaneous TID WC  . insulin glargine  15 Units Subcutaneous QHS  . losartan  50 mg Oral Daily  . Melatonin  3 mg Oral QHS  . mirtazapine  15 mg Oral QHS  . polyethylene glycol  17 g Oral Daily  . senna-docusate  1 tablet Oral BID  . sodium chloride flush  10-40 mL Intracatheter Q12H  . tamsulosin  0.4 mg Oral Daily   Continuous Infusions: . [START ON 11/07/2017] iron dextran (INFED/DEXFERRUM) infusion     Followed by  . [START ON 11/07/2017] iron dextran (INFED/DEXFERRUM) infusion       LOS: 57 days    Time spent: 25 minutes   Domenic Polite, MD [facility] [email or web address] Password [facility] 11/02/2017, 3:37 PM

## 2017-11-02 NOTE — Progress Notes (Signed)
Pt refused to have his CBG checked. Will continue to assess.

## 2017-11-03 LAB — GLUCOSE, CAPILLARY
GLUCOSE-CAPILLARY: 120 mg/dL — AB (ref 65–99)
GLUCOSE-CAPILLARY: 134 mg/dL — AB (ref 65–99)
GLUCOSE-CAPILLARY: 212 mg/dL — AB (ref 65–99)
GLUCOSE-CAPILLARY: 192 mg/dL — AB (ref 65–99)

## 2017-11-03 MED ORDER — ENSURE ENLIVE PO LIQD
237.0000 mL | Freq: Three times a day (TID) | ORAL | Status: DC
  Administered 2017-11-03 – 2017-11-05 (×7): 237 mL via ORAL

## 2017-11-03 MED ORDER — SODIUM CHLORIDE 0.9 % IV SOLN
510.0000 mg | Freq: Once | INTRAVENOUS | Status: AC
  Administered 2017-11-04: 510 mg via INTRAVENOUS
  Filled 2017-11-03 (×2): qty 17

## 2017-11-03 NOTE — Progress Notes (Signed)
Patient complaining of severe rectal pain. Oxycodone given with some effectiveness. Also having small stools throughout the day, some with small streaks of blood. Will continue to monitor.

## 2017-11-03 NOTE — Plan of Care (Signed)
Pt refusing all medications and has been educated about importance of adhering to regimen. Remains hostile and verbally abusive.

## 2017-11-03 NOTE — Progress Notes (Signed)
Pt continues to refuse medication, stating "I already took medicine this morning" and "it doesn't help me anyway". Educated pt that it will only help when he takes it the way he is supposed to. Pt ignored and would not respond further.

## 2017-11-03 NOTE — Progress Notes (Signed)
PROGRESS NOTE    Shawn Pearson  SEG:[id number] DOB: 1941-06-24 DOA: 09/05/2017 PCP: [name], NP     Brief Narrative:  Shawn Pearson is a 77 year old malenursing home residentwho presented with chest pain and uncontrolled hypertension. Patient is known to have DM, CAD, dementia, chronic kidney disease, hypertension, stage III rectal CA, severe PVD with bilateral LE amputation. Apparently patient hadbeen refusing his medications at the skilled nursing facility. Patient was admitted to the hospital working diagnosis uncontrolled hypertension. While in hospital reported to be suicidal and homicidal.He was seen by psychiatry, recommend IV Depakoteand inpatient geripsychiatric facility. While on this admission, developed rectal bleeding 12/15. He has diagnosis of adenocarcinoma 02/2017 at time of colonoscopy and previous GI bleed 03/2017, underwent sigmoidoscopy with APC ablation to the bleeding tumor +/- radiation therapy. GI was consulted during this admission, felt there was nothing different they would offer. Hospitalization has been further complicated by patient compliance with medical care, aggression. Psych was re-consulted; patient improvement on antipsychotic, patient is now recommended for skilled nursing facility placement. -Social work following, difficult placement  Assessment & Plan:   Rectal bleed/stage III rectal adenocarcinoma  -Originally diagnosed in 02/2017 and reportedly treated with some radiation then at [facility] to have intermittent rectal bleeding, back in 03/2017 underwent sigmoidoscopy and APC ablation  -Seen by gastroenterology this admission, poor candidate for further treatments due to vascular dementia and significant psychiatric issues with agitation and refusal of care  -now on fentanyl patch for pain control,  continue stool softeners/laxatives and hydrocortisone rectal cream -recommended supportive care to family and FU with Oncology at [place] on chronic blood loss anemia due to above  -Status post multiple units of PRBC this admission, 1 unit on 12/15, 2 units of PRBC on 12/27, 2 units pRBC on 1/4. Patient will need intermittent transfusions due to above, poor candidate for further invasive interventions  -anemia panel with iron deficiency -Given IV iron 1/12 -Hemoglobin relatively stable in the 7.5-8 range, monitor periodically, will give iron weekly -check CBC tomorrow  Constipation -Resolved -Having almost daily bowel movements now, continue current laxative regimen  Dementia with behavioral disturbances  -Ondivaloprexandmirtazapine, melatonin -Seen by psychiatry this admission. Avoid antipsychotics due to prolonged QTc   Hypertension -Continue amlodipine, carvedilol, clonidine changed to patch, lasix, cozaar, hydralazine. Continue to titrate BP meds   Chronic combined systolic/diastolic CHF -ECHO showed LVEF 40-45%, Grade 2 DD, hypokinesis of the inf wall -euvolemic, continue PO Lasix   Esophagitis  -Continue pepcid   Type 2 diabetes mellitus -Continue basal insulin lantus 15 U and sliding scale -Stable blood sugar   Chronic kidney disease stage III -Stable, avoid nephrotoxic medications  Acute urinary retention -Resolved   Goals of care -Has been seen by palliative care this admission and but unable complete family meeting with palliative medicine due to lack of response, unfortunately family is not actively involved.  -requested Family meeting multiple times, on Wednesday case manager was able to reach his daughter who reported that they will not be coming to the hospital and seemed somewhat estranged from the patient, daughter also told us that the person or listed as his spouse is his ex-wife, they are divorced and she does not wish to have any contact or make any decisions regarding him. 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  Code Status: DNR Family Communication:  No family at bedside, I called and d/w wife last week, requested Family meeting, or or left message for the daughter 1/10 and d/w daughter  1/12 Disposition Plan: Pending SNF placement   Consultants:   GI  Psych  Procedures:   None   Antimicrobials:  Anti-infectives (From admission, onward)   Start     Dose/Rate Route Frequency Ordered Stop   09/17/17 1000  fluconazole (DIFLUCAN) tablet 100 mg  Status:  Discontinued     100 mg Oral Daily 09/16/17 1313 09/21/17 1346   09/17/17 1000  fluconazole (DIFLUCAN) tablet 100 mg  Status:  Discontinued     100 mg Oral Daily 09/21/17 1346 09/28/17 1033   09/12/17 1100  fluconazole (DIFLUCAN) IVPB 100 mg  Status:  Discontinued     100 mg 50 mL/hr over 60 Minutes Intravenous Every 24 hours 09/12/17 1001 09/16/17 1313       Subjective: -No issues overnight, intermittently has trace amount of blood in stools   Objective: Vitals:   11/02/17 0829 11/02/17 1446 11/02/17 2132 11/03/17 0551  BP: (!) 184/63 (!) 171/65 (!) 179/71 (!) 168/72  Pulse: 64 61 71 68  Resp:  20 20 18   Temp:  98.4 F (36.9 C) 98.4 F (36.9 C) 98.2 F (36.8 C)  TempSrc:  Oral Oral Oral  SpO2:  91% 97% 96%  Weight:      Height:        Intake/Output Summary (Last 24 hours) at 11/03/2017 1320 Last data filed at 11/03/2017 1009 Gross per 24 hour  Intake 350 ml  Output 1200 ml  Net -850 ml   Filed Weights   10/21/17 0500 10/22/17 0643 10/31/17 0531  Weight: 101.7 kg (224 lb 3.3 oz) 104.2 kg (229 lb 11.5 oz) 102.1 kg (225 lb 1.4 oz)    Examination:   Gen: Awake, Alert, angry and irritable, no distress HEENT:, Neck supple, no JVD Lungs: Clear anteriorly would not let me listen to his lungs posteriorly  CVS: RRR,No Gallops,Rubs or new Murmurs Abd: soft, Non tender, non distended, BS present Extremities:  bilateral below knee  amputation Skin no new rashes Psychiatry: +Dementia   Data Reviewed: I have personally reviewed following labs and imaging studies  CBC: Recent Labs  Lab 10/28/17 0418 11/01/17 0500  WBC 7.0 6.9  HGB 7.6* 7.5*  HCT 23.9* 24.1*  MCV 84.2 83.1  PLT 271 751   Basic Metabolic Panel: Recent Labs  Lab 10/28/17 0418 11/01/17 0500  NA 137 137  K 4.2 4.2  CL 104 103  CO2 27 26  GLUCOSE 119* 235*  BUN 39* 37*  CREATININE 1.79* 1.79*  CALCIUM 7.9* 8.0*   GFR: Estimated Creatinine Clearance: 44.1 mL/min (A) (by C-G formula based on SCr of 1.79 mg/dL (H)). Liver Function Tests: No results for input(s): AST, ALT, ALKPHOS, BILITOT, PROT, ALBUMIN in the last 168 hours. No results for input(s): LIPASE, AMYLASE in the last 168 hours. No results for input(s): AMMONIA in the last 168 hours. Coagulation Profile: No results for input(s): INR, PROTIME in the last 168 hours. Cardiac Enzymes: No results for input(s): CKTOTAL, CKMB, CKMBINDEX, TROPONINI in the last 168 hours. BNP (last 3 results) No results for input(s): PROBNP in the last 8760 hours. HbA1C: No results for input(s): HGBA1C in the last 72 hours. CBG: Recent Labs  Lab 11/01/17 2150 11/02/17 0821 11/02/17 1218 11/03/17 0847 11/03/17 1208  GLUCAP 140* 115* 186* 120* 134*   Lipid Profile: No results for input(s): CHOL, HDL, LDLCALC, TRIG,  CHOLHDL, LDLDIRECT in the last 72 hours. Thyroid Function Tests: No results for input(s): TSH, T4TOTAL, FREET4, T3FREE, THYROIDAB in the last 72 hours. Anemia Panel: No results for input(s): VITAMINB12, FOLATE, FERRITIN, TIBC, IRON, RETICCTPCT in the last 72 hours. Sepsis Labs: No results for input(s): PROCALCITON, LATICACIDVEN in the last 168 hours.  No results found for this or any previous visit (from the past 240 hour(s)).     Radiology Studies: No results found.    Scheduled Meds: . amLODipine  10 mg Oral Daily  . bisacodyl  10 mg Rectal Daily  . carvedilol  25 mg  Oral BID WC  . cloNIDine  0.2 mg Transdermal Weekly  . divalproex  125 mg Oral Q12H  . famotidine  20 mg Oral QHS  . feeding supplement (GLUCERNA SHAKE)  237 mL Oral TID BM  . fentaNYL  25 mcg Transdermal Q72H  . finasteride  5 mg Oral Daily  . furosemide  60 mg Oral Daily  . gabapentin  100 mg Oral QHS  . hydrALAZINE  25 mg Oral Q8H  . hydrocortisone   Rectal QID  . insulin aspart  0-9 Units Subcutaneous TID WC  . insulin glargine  15 Units Subcutaneous QHS  . losartan  50 mg Oral Daily  . Melatonin  3 mg Oral QHS  . mirtazapine  15 mg Oral QHS  . polyethylene glycol  17 g Oral Daily  . senna-docusate  1 tablet Oral BID  . sodium chloride flush  10-40 mL Intracatheter Q12H  . tamsulosin  0.4 mg Oral Daily   Continuous Infusions: . [START ON 11/04/2017] ferumoxytol       LOS: 58 days    Time spent: 25 minutes   Domenic Polite, MD [facility] [email or web address] Password [email or web address] 11/03/2017, 1:20 PM

## 2017-11-03 NOTE — Progress Notes (Signed)
Nutrition Follow-up  DOCUMENTATION CODES:   Obesity unspecified  INTERVENTION:  1. Ensure Enlive po TID, each supplement provides 350 kcal and 20 grams of protein 2. Monitor GOC 3. Daily Weights  NUTRITION DIAGNOSIS:   Inadequate oral intake related to lethargy/confusion as evidenced by meal completion < 25%. -ongoing  GOAL:   Patient will meet greater than or equal to 90% of their needs -progressing  MONITOR:   PO intake, Supplement acceptance, Labs, Weight trends, Skin, I & O's  REASON FOR ASSESSMENT:   Malnutrition Screening Tool    ASSESSMENT:   Patient is a 77 year old male with past medical history significant for dementia, CKD, HTN , medical noncompliance wth meds, neg stress test in 2/18. Patient was admitted from SNF with chest pain, uncontrolled blood pressure due to non compliance with medication and headache.  He says he does not like the nursing home he is at and that is why he refuses to take his meds. The patient has been non co operative with the Nursing staff, and still refusing his medication. Psych has been consulted as patient likely has dementia with behavioral problems.   Difficult placement. Complaining of poor appetite, however consuming ensure. Unable to complete family palliative meetings.   Meal Completion: 10-30%  Labs reviewed:  CBGs 134, 120, 186  Medications reviewed and include:  Fent patch, Insulin, Miralax, Senokot-S    NUTRITION - FOCUSED PHYSICAL EXAM:    Most Recent Value  Orbital Region  No depletion  Upper Arm Region  No depletion  Thoracic and Lumbar Region  No depletion  Buccal Region  No depletion  Temple Region  No depletion  Clavicle Bone Region  No depletion  Clavicle and Acromion Bone Region  No depletion  Scapular Bone Region  No depletion  Dorsal Hand  No depletion  Patellar Region  No depletion  Anterior Thigh Region  No depletion  Posterior Calf Region  No depletion  Edema (RD Assessment)  None  Hair   Reviewed  Eyes  Reviewed  Mouth  Reviewed  Skin  Reviewed  Nails  Reviewed       Diet Order:  Diet Carb Modified Fluid consistency: Thin; Room service appropriate? Yes  EDUCATION NEEDS:   Not appropriate for education at this time  Skin:  Skin Assessment: Skin Integrity Issues: Skin Integrity Issues:: Other (Comment) Other: MSAD buttocks and groin  Last BM:  10/27/2017 (type 6)  Height:   Ht Readings from Last 1 Encounters:  09/06/17 6\' 2"  (1.88 m)    Weight:   Wt Readings from Last 1 Encounters:  10/31/17 225 lb 1.4 oz (102.1 kg)    Ideal Body Weight:  75.1 kg  BMI:  Body mass index is 28.9 kg/m.  Estimated Nutritional Needs:   Kcal:  1650-1850  Protein:  80-95 grams  Fluid:  1.6-1.8 L  Satira Anis. Lillian Ballester, MS, RD LDN Inpatient Clinical Dietitian Pager [phone]

## 2017-11-04 LAB — GLUCOSE, CAPILLARY
GLUCOSE-CAPILLARY: 190 mg/dL — AB (ref 65–99)
Glucose-Capillary: 138 mg/dL — ABNORMAL HIGH (ref 65–99)
Glucose-Capillary: 190 mg/dL — ABNORMAL HIGH (ref 65–99)
Glucose-Capillary: 118 mg/dL — ABNORMAL HIGH (ref 65–99)

## 2017-11-04 NOTE — Progress Notes (Signed)
PROGRESS NOTE    Shawn Pearson  YBO:[id number] DOB: 05-11-1941 DOA: 09/05/2017 PCP: [name], NP     Brief Narrative:  Shawn Pearson is a 77 year old malenursing home residentwho presented with chest pain and uncontrolled hypertension. Patient is known to have DM, CAD, dementia, chronic kidney disease, hypertension, stage III rectal CA, severe PVD with bilateral LE amputation. Apparently patient hadbeen refusing his medications at the skilled nursing facility. Patient was admitted to the hospital working diagnosis uncontrolled hypertension. While in hospital reported to be suicidal and homicidal.He was seen by psychiatry, recommend IV Depakoteand inpatient geripsychiatric facility. While on this admission, developed rectal bleeding 12/15. He has diagnosis of adenocarcinoma 02/2017 at time of colonoscopy and previous GI bleed 03/2017, underwent sigmoidoscopy with APC ablation to the bleeding tumor +/- radiation therapy. GI was consulted during this admission, felt there was nothing different they would offer. Hospitalization has been further complicated by patient compliance with medical care, aggression, refusal of care and meds intermittently, since then has mellowed down, Psych was re-consulted; patient improvement on antipsychotic, patient is now recommended for skilled nursing facility placement. -Social work following, difficult placement, remains stable overall  Assessment & Plan:   Rectal bleed/stage III rectal adenocarcinoma  -Originally diagnosed in 02/2017 and reportedly treated with some radiation then at [place] to have intermittent rectal bleeding, back in 03/2017 underwent sigmoidoscopy and APC ablation  -Seen by gastroenterology this admission, poor candidate for further treatments due to vascular dementia and significant psychiatric issues with agitation and refusal of care  -now on fentanyl patch for pain control,  continue stool softeners/laxatives and  hydrocortisone rectal cream -recommended supportive care to family and FU with Oncology at [facility] on chronic blood loss anemia due to above  -Status post multiple units of PRBC this admission, 1 unit on 12/15, 2 units of PRBC on 12/27, 2 units pRBC on 1/4. Patient will need intermittent transfusions due to above, poor candidate for further invasive interventions  -anemia panel with iron deficiency -Given IV iron 1/12 -Hemoglobin relatively stable in the 7.5-8 range, monitor periodically, will give iron weekly -CBC pending this am, will get another dose of IV Iron today  Constipation -Resolved -Having almost daily bowel movements now, continue current laxative regimen  Dementia with behavioral disturbances  -Ondivaloprexandmirtazapine, melatonin -Seen by psychiatry this admission. Avoid antipsychotics due to prolonged QTc   Hypertension -BP controlled when he takes his meds, unfortunately intermittently declines meds -Continue amlodipine, carvedilol, clonidine changed to patch, lasix, cozaar, hydralazine. Continue to titrate BP meds   Chronic combined systolic/diastolic CHF -ECHO showed LVEF 40-45%, Grade 2 DD, hypokinesis of the inf wall -euvolemic, continue PO Lasix   Esophagitis  -Continue pepcid   Type 2 diabetes mellitus -Continue basal insulin lantus 15 U and sliding scale -Stable blood sugar   Chronic kidney disease stage III -Stable, avoid nephrotoxic medications  Acute urinary retention -Resolved   Goals of care -Has been seen by palliative care this admission and but unable complete family meeting with palliative medicine due to lack of response, unfortunately family is not actively involved.  -requested Family meeting multiple times, on Wednesday case manager was able to reach his daughter who reported that they will not be coming to the hospital and seemed somewhat estranged from the patient, daughter also told us that the person or listed as  his spouse is his ex-wife, they are divorced and she does not wish to have any contact or make any decisions regarding him. -Called and  discussed situation with daughter 1/12, she refused to come to the hospital, poor health literacy and minimal engagement from family.  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  Code Status: DNR Family Communication: No family at bedside, I called and d/w wife last week, requested Family meeting, or or left message for the daughter 1/10 and d/w daughter  1/12 Disposition Plan: Pending SNF placement, remains medically stable, difficult to find a facility thus far   Consultants:   GI  Psych  Procedures:   None   Antimicrobials:  Anti-infectives (From admission, onward)   Start     Dose/Rate Route Frequency Ordered Stop   09/17/17 1000  fluconazole (DIFLUCAN) tablet 100 mg  Status:  Discontinued     100 mg Oral Daily 09/16/17 1313 09/21/17 1346   09/17/17 1000  fluconazole (DIFLUCAN) tablet 100 mg  Status:  Discontinued     100 mg Oral Daily 09/21/17 1346 09/28/17 1033   09/12/17 1100  fluconazole (DIFLUCAN) IVPB 100 mg  Status:  Discontinued     100 mg 50 mL/hr over 60 Minutes Intravenous Every 24 hours 09/12/17 1001 09/16/17 1313       Subjective: -confused this am and then more lucid shortly after, asking for ice water, unable to have meaningful conversation  Objective: Vitals:   11/03/17 0551 11/03/17 1439 11/03/17 2147 11/04/17 0529  BP: (!) 168/72 (!) 191/68 (!) 188/73 (!) 194/60  Pulse: 68 67 65 60  Resp: 18 20 18 18   Temp: 98.2 F (36.8 C) 98.7 F (37.1 C) 98.4 F (36.9 C) 98.6 F (37 C)  TempSrc: Oral Oral Oral Oral  SpO2: 96% 100% 100% 96%  Weight:      Height:        Intake/Output Summary (Last 24 hours) at 11/04/2017 1129 Last data filed at 11/04/2017 1005 Gross per 24 hour  Intake 123 ml  Output 950 ml  Net -827 ml   Filed Weights   10/21/17 0500 10/22/17 0643 10/31/17  0531  Weight: 101.7 kg (224 lb 3.3 oz) 104.2 kg (229 lb 11.5 oz) 102.1 kg (225 lb 1.4 oz)    Examination:   Gen: Awake, Alert, remains irritable, angry and dysarthric  HEENT: PERRLA, Neck supple, no JVD Lungs: decreased at bases, rest clear CVS: RRR,No Gallops,Rubs or new Murmurs Abd: soft, Non tender, non distended, BS present Extremities: B/L BKA Skin: no new rashes Psychiatry: +Dementia   Data Reviewed: I have personally reviewed following labs and imaging studies  CBC: Recent Labs  Lab 11/01/17 0500  WBC 6.9  HGB 7.5*  HCT 24.1*  MCV 83.1  PLT 130   Basic Metabolic Panel: Recent Labs  Lab 11/01/17 0500  NA 137  K 4.2  CL 103  CO2 26  GLUCOSE 235*  BUN 37*  CREATININE 1.79*  CALCIUM 8.0*   GFR: Estimated Creatinine Clearance: 44.1 mL/min (A) (by C-G formula based on SCr of 1.79 mg/dL (H)). Liver Function Tests: No results for input(s): AST, ALT, ALKPHOS, BILITOT, PROT, ALBUMIN in the last 168 hours. No results for input(s): LIPASE, AMYLASE in the last 168 hours. No results for input(s): AMMONIA in the last 168 hours. Coagulation Profile: No results for input(s): INR, PROTIME in the last 168 hours. Cardiac Enzymes: No results for input(s): CKTOTAL, CKMB, CKMBINDEX, TROPONINI in the last 168 hours. BNP (last 3 results) No results for input(s): PROBNP in the last 8760 hours. HbA1C: No results for input(s): HGBA1C in the last  72 hours. CBG: Recent Labs  Lab 11/03/17 0847 11/03/17 1208 11/03/17 1646 11/03/17 2143 11/04/17 0744  GLUCAP 120* 134* 212* 192* 138*   Lipid Profile: No results for input(s): CHOL, HDL, LDLCALC, TRIG, CHOLHDL, LDLDIRECT in the last 72 hours. Thyroid Function Tests: No results for input(s): TSH, T4TOTAL, FREET4, T3FREE, THYROIDAB in the last 72 hours. Anemia Panel: No results for input(s): VITAMINB12, FOLATE, FERRITIN, TIBC, IRON, RETICCTPCT in the last 72 hours. Sepsis Labs: No results for input(s): PROCALCITON,  LATICACIDVEN in the last 168 hours.  No results found for this or any previous visit (from the past 240 hour(s)).     Radiology Studies: No results found.    Scheduled Meds: . amLODipine  10 mg Oral Daily  . bisacodyl  10 mg Rectal Daily  . carvedilol  25 mg Oral BID WC  . cloNIDine  0.2 mg Transdermal Weekly  . divalproex  125 mg Oral Q12H  . famotidine  20 mg Oral QHS  . feeding supplement (ENSURE ENLIVE)  237 mL Oral TID BM  . fentaNYL  25 mcg Transdermal Q72H  . finasteride  5 mg Oral Daily  . furosemide  60 mg Oral Daily  . gabapentin  100 mg Oral QHS  . hydrALAZINE  25 mg Oral Q8H  . hydrocortisone   Rectal QID  . insulin aspart  0-9 Units Subcutaneous TID WC  . insulin glargine  15 Units Subcutaneous QHS  . losartan  50 mg Oral Daily  . Melatonin  3 mg Oral QHS  . mirtazapine  15 mg Oral QHS  . polyethylene glycol  17 g Oral Daily  . senna-docusate  1 tablet Oral BID  . sodium chloride flush  10-40 mL Intracatheter Q12H  . tamsulosin  0.4 mg Oral Daily   Continuous Infusions: . ferumoxytol       LOS: 59 days    Time spent: 25 minutes   Shawn Polite, MD [facility] [email or web address] Password [facility] 11/04/2017, 11:29 AM

## 2017-11-05 LAB — GLUCOSE, CAPILLARY
GLUCOSE-CAPILLARY: 83 mg/dL (ref 65–99)
Glucose-Capillary: 135 mg/dL — ABNORMAL HIGH (ref 65–99)

## 2017-11-05 NOTE — Progress Notes (Signed)
Pt refusing vital signs and CBG checks. Was unable to give blood pressure medicine and insulin. Educated pt on the importance of checking blood pressure and blood sugar in order to administer medicine. Pt refused to listen. Pt also has frequent bloody stools. Will continue to monitor.

## 2017-11-05 NOTE — Progress Notes (Signed)
PROGRESS NOTE    Shawn Pearson  QKS:[id number] DOB: 1941-02-04 DOA: 09/05/2017 PCP: [name], NP     Brief Narrative:  Shawn Pearson is a 77 year old malenursing home residentwho presented with chest pain and uncontrolled hypertension. Patient is known to have DM, CAD, dementia, chronic kidney disease, hypertension, stage III rectal CA, severe PVD with bilateral LE amputation. Apparently patient hadbeen refusing his medications at the skilled nursing facility. Patient was admitted to the hospital working diagnosis uncontrolled hypertension. While in hospital reported to be suicidal and homicidal.He was seen by psychiatry, recommend IV Depakoteand inpatient geripsychiatric facility. While on this admission, developed rectal bleeding 12/15. He has diagnosis of adenocarcinoma 02/2017 at time of colonoscopy and previous GI bleed 03/2017, underwent sigmoidoscopy with APC ablation to the bleeding tumor +/- radiation therapy. GI was consulted during this admission, felt there was nothing different they would offer. Hospitalization has been further complicated by patient compliance with medical care, aggression, refusal of care and meds intermittently, since then has mellowed down, Psych was re-consulted; patient improvement on antipsychotic, patient is now recommended for skilled nursing facility placement. -Social work following, difficult placement, remains stable overall  11/05/17 - Patient seen. BP remains uncontrolled. Patient not compliant, and as per Nursing documentation, patient would not allow his BP to be checked. Otherwise, no new complaints.  Assessment & Plan:   Rectal bleed/stage III rectal adenocarcinoma  -Originally diagnosed in 02/2017 and reportedly treated with some radiation then at [place] to have intermittent rectal bleeding, back in 03/2017 underwent sigmoidoscopy and APC ablation  -Seen by gastroenterology this admission, poor candidate for further treatments  due to vascular dementia and significant psychiatric issues with agitation and refusal of care  -now on fentanyl patch for pain control,  continue stool softeners/laxatives and hydrocortisone rectal cream -recommended supportive care to family and FU with Oncology at [facility] [facility] care. Monitor CBC.  Acute on chronic blood loss anemia due to above  -Status post multiple units of PRBC this admission, 1 unit on 12/15, 2 units of PRBC on 12/27, 2 units pRBC on 1/4. Patient will need intermittent transfusions due to above, poor candidate for further invasive interventions  -anemia panel with iron deficiency -Given IV iron 1/12 -Hemoglobin relatively stable in the 7.5-8 range, monitor periodically, will give iron weekly - Monitor. Transfuse PRBC PRN  Constipation -Resolved -Having almost daily bowel movements now, continue current laxative regimen  Dementia with behavioral disturbances  -Ondivaloprexandmirtazapine, melatonin -Seen by psychiatry this admission. Avoid antipsychotics due to prolonged QTc   Hypertension, uncontrolled, and non complaint:  -BP controlled when he takes his meds, unfortunately intermittently declines meds -Continue amlodipine, carvedilol, clonidine changed to patch, lasix, cozaar, hydralazine. Continue to titrate BP meds  - Continue to encourage patient to be compliant  Chronic combined systolic/diastolic CHF -ECHO showed LVEF 40-45%, Grade 2 DD, hypokinesis of the inf wall -euvolemic, continue PO Lasix   Esophagitis  -Continue pepcid   Type 2 diabetes mellitus -Continue basal insulin lantus 15 U and sliding scale -Stable blood sugar   Chronic kidney disease stage III -Stable, avoid nephrotoxic medications  Acute urinary retention -Resolved   Goals of care -Has been seen by palliative care this admission and but unable complete family meeting with palliative medicine due to lack of response, unfortunately family is not actively  involved.  -requested Family meeting multiple times, on Wednesday case manager was able to reach his daughter who reported that they will not be coming to the hospital and seemed  somewhat estranged from the patient, daughter also told us that the person or listed as his spouse is his ex-wife, they are divorced and she does not wish to have any contact or make any decisions regarding him. -Called and discussed situation with daughter 1/12, she refused to come to the hospital, poor health literacy and minimal engagement from family.  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  Code Status: DNR Family Communication: No family at bedside, I called and d/w wife last week, requested Family meeting, or or left message for the daughter 1/10 and d/w daughter  1/12 Disposition Plan: Pending SNF placement, remains medically stable, difficult to find a facility thus far   Consultants:   GI  Psych  Procedures:   None   Antimicrobials:  Anti-infectives (From admission, onward)   Start     Dose/Rate Route Frequency Ordered Stop   09/17/17 1000  fluconazole (DIFLUCAN) tablet 100 mg  Status:  Discontinued     100 mg Oral Daily 09/16/17 1313 09/21/17 1346   09/17/17 1000  fluconazole (DIFLUCAN) tablet 100 mg  Status:  Discontinued     100 mg Oral Daily 09/21/17 1346 09/28/17 1033   09/12/17 1100  fluconazole (DIFLUCAN) IVPB 100 mg  Status:  Discontinued     100 mg 50 mL/hr over 60 Minutes Intravenous Every 24 hours 09/12/17 1001 09/16/17 1313       Subjective: - No much history from patient - No new complaints - Elevated BP noted.  Objective: Vitals:   11/04/17 0529 11/04/17 1300 11/04/17 2212 11/05/17 0952  BP: (!) 194/60 (!) 171/78 (!) 188/87 (!) 193/77  Pulse: 60 62 67   Resp: 18 20 19    Temp: 98.6 F (37 C) 98.7 F (37.1 C) (!) 97.4 F (36.3 C)   TempSrc: Oral Oral Oral   SpO2: 96% 100% 91%   Weight:      Height:         Intake/Output Summary (Last 24 hours) at 11/05/2017 1446 Last data filed at 11/05/2017 1124 Gross per 24 hour  Intake 10 ml  Output 1002 ml  Net -992 ml   Filed Weights   10/21/17 0500 10/22/17 0643 10/31/17 0531  Weight: 101.7 kg (224 lb 3.3 oz) 104.2 kg (229 lb 11.5 oz) 102.1 kg (225 lb 1.4 oz)    Examination:   Gen: Awake, Alert, remains irritable, angry and dysarthric  HEENT: PERRLA, Neck supple, no JVD Lungs: decreased at bases, rest clear CVS: RRR,No Gallops,Rubs or new Murmurs Abd: soft, Non tender, non distended, BS present Extremities: B/L BKA Skin: no new rashes Psychiatry: +Dementia   Data Reviewed: I have personally reviewed following labs and imaging studies  CBC: Recent Labs  Lab 11/01/17 0500  WBC 6.9  HGB 7.5*  HCT 24.1*  MCV 83.1  PLT 938   Basic Metabolic Panel: Recent Labs  Lab 11/01/17 0500  NA 137  K 4.2  CL 103  CO2 26  GLUCOSE 235*  BUN 37*  CREATININE 1.79*  CALCIUM 8.0*   GFR: Estimated Creatinine Clearance: 44.1 mL/min (A) (by C-G formula based on SCr of 1.79 mg/dL (H)). Liver Function Tests: No results for input(s): AST, ALT, ALKPHOS, BILITOT, PROT, ALBUMIN in the last 168 hours. No results for input(s): LIPASE, AMYLASE in the last 168 hours. No results for input(s): AMMONIA in the last 168 hours. Coagulation Profile: No results for input(s): INR, PROTIME in the last 168 hours. Cardiac Enzymes: No results  for input(s): CKTOTAL, CKMB, CKMBINDEX, TROPONINI in the last 168 hours. BNP (last 3 results) No results for input(s): PROBNP in the last 8760 hours. HbA1C: No results for input(s): HGBA1C in the last 72 hours. CBG: Recent Labs  Lab 11/04/17 0744 11/04/17 1255 11/04/17 1736 11/04/17 2213 11/05/17 0841  GLUCAP 138* 190* 190* 118* 83   Lipid Profile: No results for input(s): CHOL, HDL, LDLCALC, TRIG, CHOLHDL, LDLDIRECT in the last 72 hours. Thyroid Function Tests: No results for input(s): TSH, T4TOTAL, FREET4,  T3FREE, THYROIDAB in the last 72 hours. Anemia Panel: No results for input(s): VITAMINB12, FOLATE, FERRITIN, TIBC, IRON, RETICCTPCT in the last 72 hours. Sepsis Labs: No results for input(s): PROCALCITON, LATICACIDVEN in the last 168 hours.  No results found for this or any previous visit (from the past 240 hour(s)).     Radiology Studies: No results found.    Scheduled Meds: . amLODipine  10 mg Oral Daily  . bisacodyl  10 mg Rectal Daily  . carvedilol  25 mg Oral BID WC  . cloNIDine  0.2 mg Transdermal Weekly  . divalproex  125 mg Oral Q12H  . famotidine  20 mg Oral QHS  . feeding supplement (ENSURE ENLIVE)  237 mL Oral TID BM  . fentaNYL  25 mcg Transdermal Q72H  . finasteride  5 mg Oral Daily  . furosemide  60 mg Oral Daily  . gabapentin  100 mg Oral QHS  . hydrALAZINE  25 mg Oral Q8H  . hydrocortisone   Rectal QID  . insulin aspart  0-9 Units Subcutaneous TID WC  . insulin glargine  15 Units Subcutaneous QHS  . losartan  50 mg Oral Daily  . Melatonin  3 mg Oral QHS  . mirtazapine  15 mg Oral QHS  . polyethylene glycol  17 g Oral Daily  . senna-docusate  1 tablet Oral BID  . sodium chloride flush  10-40 mL Intracatheter Q12H  . tamsulosin  0.4 mg Oral Daily   Continuous Infusions:    LOS: 60 days    Time spent: 25 minutes   Bonnell Public, MD [facility] [email or web address] Password [facility] 11/05/2017, 2:46 PM

## 2017-11-05 NOTE — Progress Notes (Signed)
Pt refused BP check and CBG for 1700 medication pass. Unable to safely administer medications to pt. Pt resting in bed and does not want to be bothered except to be cleaned up. Will continue to monitor pt.

## 2017-11-05 NOTE — Progress Notes (Signed)
Pt was noted to have elevated blood pressure at 2212.  Pt refused to allow this nurse to check his blood pressure to determine if IV hydralazine was needed.  Will continue to monitor.

## 2017-11-06 LAB — GLUCOSE, CAPILLARY: GLUCOSE-CAPILLARY: 194 mg/dL — AB (ref 65–99)

## 2017-11-06 MED ORDER — CARVEDILOL 12.5 MG PO TABS: 37.5000 mg | ORAL_TABLET | Freq: Two times a day (BID) | ORAL | 0 refills | Status: AC

## 2017-11-06 MED ORDER — CLONIDINE 0.3 MG/24HR TD PTWK: 0.3000 mg | MEDICATED_PATCH | TRANSDERMAL | 12 refills | Status: AC

## 2017-11-06 MED ORDER — BISACODYL 10 MG RE SUPP: 10.0000 mg | Freq: Every day | RECTAL | 0 refills | Status: AC

## 2017-11-06 MED ORDER — HYDROCORTISONE 2.5 % RE CREA: TOPICAL_CREAM | Freq: Four times a day (QID) | RECTAL | 0 refills | Status: AC

## 2017-11-06 MED ORDER — LABETALOL HCL 5 MG/ML IV SOLN
10.0000 mg | Freq: Once | INTRAVENOUS | Status: AC
  Administered 2017-11-06: 10 mg via INTRAVENOUS
  Filled 2017-11-06: qty 4

## 2017-11-06 MED ORDER — FAMOTIDINE 20 MG PO TABS: 20.0000 mg | ORAL_TABLET | Freq: Every day | ORAL | 0 refills | Status: AC

## 2017-11-06 MED ORDER — POLYVINYL ALCOHOL 1.4 % OP SOLN: 1.0000 [drp] | OPHTHALMIC | 0 refills | Status: AC | PRN

## 2017-11-06 MED ORDER — GABAPENTIN 100 MG PO CAPS: 100.0000 mg | ORAL_CAPSULE | Freq: Every day | ORAL | 0 refills | Status: AC

## 2017-11-06 MED ORDER — FUROSEMIDE 20 MG PO TABS: 60.0000 mg | ORAL_TABLET | Freq: Every day | ORAL | 0 refills | Status: AC

## 2017-11-06 MED ORDER — FENTANYL 25 MCG/HR TD PT72: 25.0000 ug | MEDICATED_PATCH | TRANSDERMAL | 0 refills | Status: AC

## 2017-11-06 MED ORDER — DIVALPROEX SODIUM 125 MG PO CSDR: 125.0000 mg | DELAYED_RELEASE_CAPSULE | Freq: Two times a day (BID) | ORAL | 0 refills | Status: AC

## 2017-11-06 MED ORDER — IPRATROPIUM-ALBUTEROL 0.5-2.5 (3) MG/3ML IN SOLN: 3.0000 mL | Freq: Four times a day (QID) | RESPIRATORY_TRACT | 0 refills | Status: AC | PRN

## 2017-11-06 MED ORDER — CLONIDINE HCL 0.3 MG/24HR TD PTWK
0.3000 mg | MEDICATED_PATCH | TRANSDERMAL | Status: DC
  Filled 2017-11-06: qty 1

## 2017-11-06 MED ORDER — INSULIN ASPART 100 UNIT/ML ~~LOC~~ SOLN: 0.0000 [IU] | Freq: Three times a day (TID) | SUBCUTANEOUS | 11 refills | Status: AC

## 2017-11-06 MED ORDER — SENNOSIDES-DOCUSATE SODIUM 8.6-50 MG PO TABS: 1.0000 | ORAL_TABLET | Freq: Two times a day (BID) | ORAL | 0 refills | Status: AC

## 2017-11-06 MED ORDER — CARVEDILOL 25 MG PO TABS: 37.5000 mg | ORAL_TABLET | Freq: Two times a day (BID) | ORAL | Status: DC

## 2017-11-06 MED ORDER — LOSARTAN POTASSIUM 100 MG PO TABS: 100.0000 mg | ORAL_TABLET | Freq: Every day | ORAL | 0 refills | Status: AC

## 2017-11-06 MED ORDER — MIRTAZAPINE 15 MG PO TABS: 15.0000 mg | ORAL_TABLET | Freq: Every day | ORAL | 0 refills | Status: AC

## 2017-11-06 MED ORDER — LOSARTAN POTASSIUM 50 MG PO TABS
100.0000 mg | ORAL_TABLET | Freq: Every day | ORAL | Status: DC
  Administered 2017-11-06: 100 mg via ORAL
  Filled 2017-11-06: qty 2

## 2017-11-06 MED ORDER — INSULIN GLARGINE 100 UNIT/ML ~~LOC~~ SOLN: 15.0000 [IU] | Freq: Every day | SUBCUTANEOUS | 11 refills | Status: AC

## 2017-11-06 NOTE — Progress Notes (Signed)
Pt noted to be hypertensive.  Administered 5mg  hydralazine IV, BP 198/75.  Administered 10mg  labetalol IV, BP now 194/58.  Paged provider C. Bodenheimer, who ordered an additional 10mg  labetalol IV.  Will continue to monitor.

## 2017-11-06 NOTE — Clinical Social Work Placement (Signed)
   CLINICAL SOCIAL WORK PLACEMENT  NOTE  Date:  11/06/2017  Patient Details  Name: Shawn Pearson MRN: [id number] Date of Birth: [birth year]  Clinical Social Work is seeking post-discharge placement for this patient at the [place] level of care (*CSW will initial, date and re-position this form in  chart as items are completed):  Yes   Patient/family provided with [place] Work Department's list of facilities offering this level of care within the geographic area requested by the patient (or if unable, by the patient's family).  Yes   Patient/family informed of their freedom to choose among providers that offer the needed level of care, that participate in Medicare, Medicaid or managed care program needed by the patient, have an available bed and are willing to accept the patient.  Yes   Patient/family informed of [facility]'s ownership interest in [facility] and [facility], as well as of the fact that they are under no obligation to receive care at these facilities.  PASRR submitted to EDS on       PASRR number received on       Existing PASRR number confirmed on 10/22/17     FL2 transmitted to all facilities in geographic area requested by pt/family on 10/22/17     FL2 transmitted to all facilities within larger geographic area on       Patient informed that his/her managed care company has contracts with or will negotiate with certain facilities, including the following:        Yes   Patient/family informed of bed offers received.  Patient chooses bed at [facility])     Physician recommends and patient chooses bed at      Patient to be transferred to [facility]) on 11/06/17.  Patient to be transferred to facility by ptar     Patient family notified on 11/06/17 of transfer.  Name of family member notified:  Sirena- dtr     PHYSICIAN Please sign FL2     Additional Comment:     _______________________________________________ Jorge Ny, LCSW 11/06/2017, 3:11 PM

## 2017-11-06 NOTE — Care Management Note (Signed)
Case Management Note  Patient Details  Name: Shawn Pearson MRN: [id number] Date of Birth: [birth year]  Subjective/Objective:                 Patient with order to discharge to [facility] (SNF). SNF discharge facilitated through [facility] ([place]). Please refer to [place] notes for disposition plan and direct questions CSW on call accordingly. CM signing off   Action/Plan:   Expected Discharge Date:  11/06/17               Expected Discharge Plan:  [place]  In-House Referral:  Clinical Social Work  Discharge planning Services  CM Consult  Post Acute Care Choice:    Choice offered to:     DME Arranged:    DME Agency:     HH Arranged:    Page Agency:     Status of Service:  Completed, signed off  If discussed at [employer], dates discussed:    Additional Comments:  Carles Collet, RN 11/06/2017, 2:44 PM

## 2017-11-06 NOTE — Progress Notes (Signed)
Left vm for patient's daughter regarding discharge plan to [facility] at [place].  Percell Locus Charna Neeb LCSW [phone]

## 2017-11-06 NOTE — Progress Notes (Signed)
BP now 188/58 after 2nd dose of labetalol.  Paged provider C. Bodenheimer.  No new orders received at this time.  Will continue to monitor.

## 2017-11-06 NOTE — Discharge Summary (Addendum)
Physician Discharge Summary  Patient ID: Shawn Pearson MRN: [id number] DOB/AGE: Oct 03, 1941 77 y.o.  Admit date: 09/05/2017 Discharge date: 11/06/2017  Admission Diagnoses:  Discharge Diagnoses:  Principal Problem:   Rectal bleeding   Acute on Chronic combined systolic and diastolic CHF. Active Problems:   Hypertensive urgency   CKD stage 3 secondary to diabetes (HCC)   Rectal cancer (HCC)   Chest pain   Acute on chronic anemia   Dementia   Noncompliance with medication regimen   Agitation   Elevated troponin   Malignant neoplasm of colon [facility])   Palliative care by specialist   DNR (do not resuscitate)   Discharged Condition: stable  Hospital Course: Patient is a 77 year old male, [place] resident, with past medical history significant for DM, CAD, dementia, chronic kidney disease, hypertension, stage III rectal CA, severe PVD with bilateral LE amputation. Apparently patient hadbeen refusing his medications at the skilled nursing facility. Patient was admittedwith chest pain and uncontrolled hypertension. Patient was said to have reported being suicidal suicidal and homicidal.Patient was seen by psychiatry who recommend IV Depakoteand inpatient geripsychiatric facility. While on this admission, Patient developed rectal bleeding 12/15. He has diagnosis of adenocarcinoma 02/2017 at time of colonoscopy and previous GI bleed 03/2017, underwent sigmoidoscopy with APC ablation to the bleeding tumor +/- radiation therapy. GI was consulted during this admission, and GI felt there was nothing different they would offer. Hospitalization has been further complicated by patient's compliance with medical care, aggression, refusal of care and medication intermittently. Patient was also started on antipsychotics. Cardiology was consulted for chest pain. Rectal bleed as managed supportively. Patient has been optimized.    Treatments: See above  Discharge Exam: Blood pressure (!)  176/71, pulse 74, temperature 98.6 F (37 C), temperature source Oral, resp. rate 20, height 6\' 2"  (1.88 m), weight 102.1 kg (225 lb 1.4 oz), SpO2 100 %.   Disposition: 01-Home or Self Care  Discharge Instructions    Call MD for:   Complete by:  As directed    Call MD with worsening of symptoms   Diet - low sodium heart healthy   Complete by:  As directed    Consistent carbohydrate diet   Discharge instructions   Complete by:  As directed    Measure blood pressure at least three times daily   Increase activity slowly   Complete by:  As directed      Allergies as of 11/06/2017   No Known Allergies     Medication List    STOP taking these medications   BASAGLAR KWIKPEN 100 UNIT/ML Sopn Replaced by:  insulin glargine 100 UNIT/ML injection   haloperidol lactate 5 MG/ML injection Commonly known as:  HALDOL     TAKE these medications   amLODipine 10 MG tablet Commonly known as:  NORVASC Take 10 mg by mouth daily.   atorvastatin 40 MG tablet Commonly known as:  LIPITOR Take 40 mg by mouth at bedtime.   bisacodyl 10 MG suppository Commonly known as:  DULCOLAX Place 1 suppository (10 mg total) rectally daily.   carvedilol 12.5 MG tablet Commonly known as:  COREG Take 3 tablets (37.5 mg total) by mouth 2 (two) times daily with a meal. What changed:  how much to take   cloNIDine 0.3 mg/24hr patch Commonly known as:  CATAPRES - Dosed in mg/24 hr Place 1 patch (0.3 mg total) onto the skin once a week.   divalproex 125 MG capsule Commonly known as:  DEPAKOTE SPRINKLE Take 1  capsule (125 mg total) by mouth every 12 (twelve) hours.   famotidine 20 MG tablet Commonly known as:  PEPCID Take 1 tablet (20 mg total) by mouth at bedtime.   feeding supplement (GLUCERNA SHAKE) Liqd Take 237 mLs 2 (two) times daily between meals by mouth.   fentaNYL 25 MCG/HR patch Commonly known as:  DURAGESIC - dosed mcg/hr Place 1 patch (25 mcg total) onto the skin every 3 (three)  days.   finasteride 5 MG tablet Commonly known as:  PROSCAR Take 5 mg by mouth daily.   furosemide 20 MG tablet Commonly known as:  LASIX Take 3 tablets (60 mg total) by mouth daily. What changed:  medication strength   gabapentin 100 MG capsule Commonly known as:  NEURONTIN Take 1 capsule (100 mg total) by mouth at bedtime.   hydrocortisone 2.5 % rectal cream Commonly known as:  ANUSOL-HC Place rectally 4 (four) times daily.   insulin aspart 100 UNIT/ML injection Commonly known as:  novoLOG Inject 0-9 Units into the skin 3 (three) times daily with meals.   insulin glargine 100 UNIT/ML injection Commonly known as:  LANTUS Inject 0.15 mLs (15 Units total) into the skin at bedtime. Replaces:  BASAGLAR KWIKPEN 100 UNIT/ML Sopn   ipratropium-albuterol 0.5-2.5 (3) MG/3ML Soln Commonly known as:  DUONEB Take 3 mLs by nebulization every 6 (six) hours as needed.   losartan 100 MG tablet Commonly known as:  COZAAR Take 1 tablet (100 mg total) by mouth daily. What changed:    medication strength  how much to take   mirtazapine 15 MG tablet Commonly known as:  REMERON Take 1 tablet (15 mg total) by mouth at bedtime.   nitroGLYCERIN 0.4 MG SL tablet Commonly known as:  NITROSTAT Place 0.4 mg as needed under the tongue for chest pain.   polyethylene glycol powder powder Commonly known as:  GLYCOLAX/MIRALAX Take 17 g by mouth daily.   polyvinyl alcohol 1.4 % ophthalmic solution Commonly known as:  LIQUIFILM TEARS Place 1 drop into both eyes as needed for dry eyes.   senna-docusate 8.6-50 MG tablet Commonly known as:  Senokot-S Take 1 tablet by mouth 2 (two) times daily.   tamsulosin 0.4 MG Caps capsule Commonly known as:  FLOMAX Take 0.4 mg daily by mouth.        SignedBonnell Public 11/06/2017, 2:24 PM

## 2017-11-06 NOTE — Progress Notes (Signed)
Patient still on [facility] waitlist.  [name] LCSW [phone]

## 2017-11-06 NOTE — Progress Notes (Signed)
Patient will discharge to [facility] Anticipated discharge date:1/18 Family notified: Designer, television/film set by [employer]- called at [facility] signing off.  Jorge Ny, LCSW Clinical Social Worker [phone]

## 2017-11-06 NOTE — Progress Notes (Signed)
[facility] liaison came to assess patient yesterday. Awaiting determination from their administrator.   Percell Locus Lytle [place] LCSW [phone]

## 2018-04-19 DEATH — deceased

## 2019-05-23 IMAGING — CT CT ABD-PELV W/O CM
2 of 5 series · 14 of 46 positions shown, 16 images · non-contrast
Comparison: None.

CLINICAL DATA: History of rectal cancer

GI bleed
EXAM:
CT CHEST, ABDOMEN AND PELVIS WITHOUT CONTRAST
TECHNIQUE: Multidetector CT imaging of the chest, abdomen and pelvis was
performed following the standard protocol without IV contrast.

[Series 5: cap w/o 3.0 mm st cor · coronal · non-contrast · 0.87mm/px · 3 of 117 slices shown]
[im 39/117  soft-tissue]
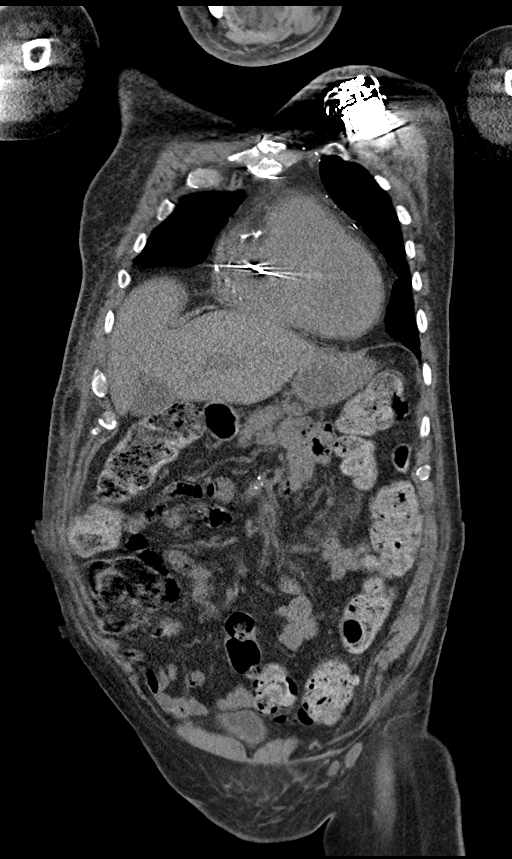
[im 52/117  soft-tissue]
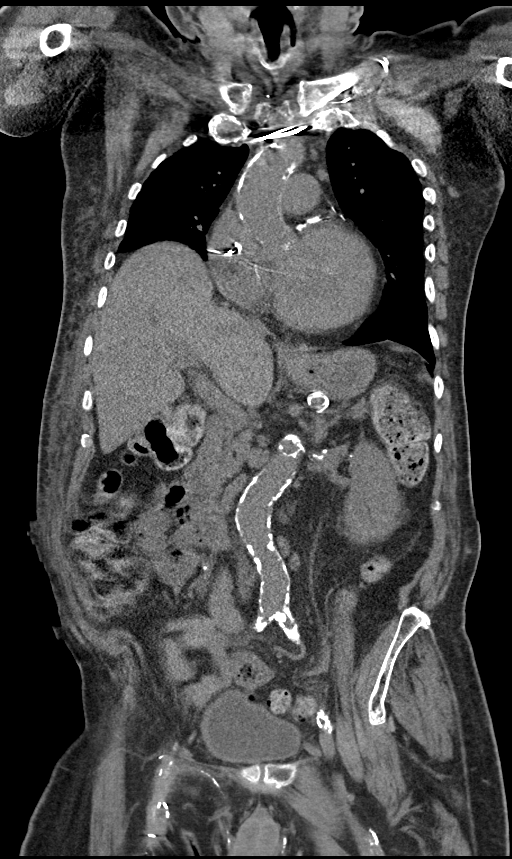
[im 65/117  soft-tissue]
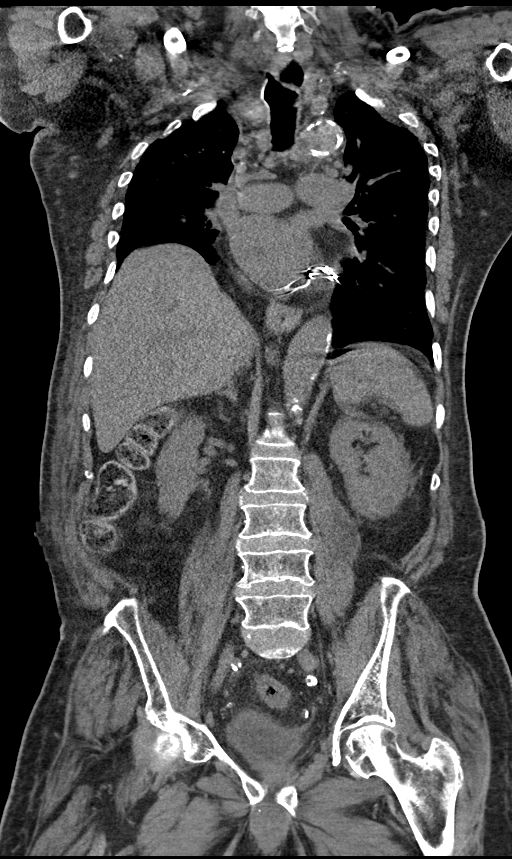

[Series 8: cap w/o 2.0 mm st · axial · non-contrast · 0.98mm/px · z∈[-776,-132]mm · 11 of 369 slices shown, 13 images]
[im 24/369  soft-tissue]
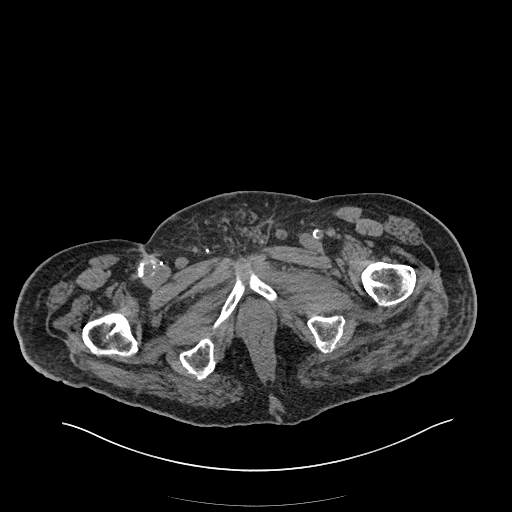
[im 24/369  bone]
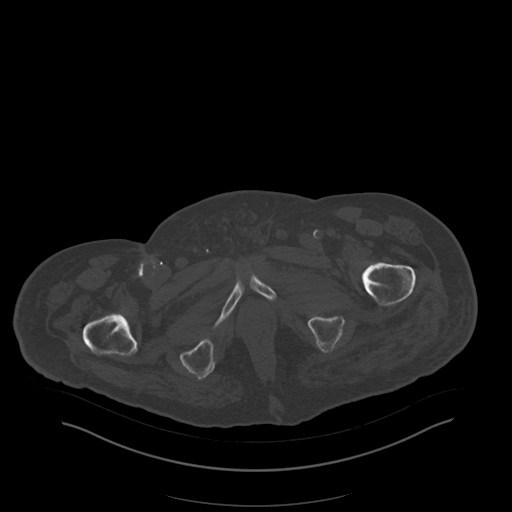
[im 70/369  soft-tissue]
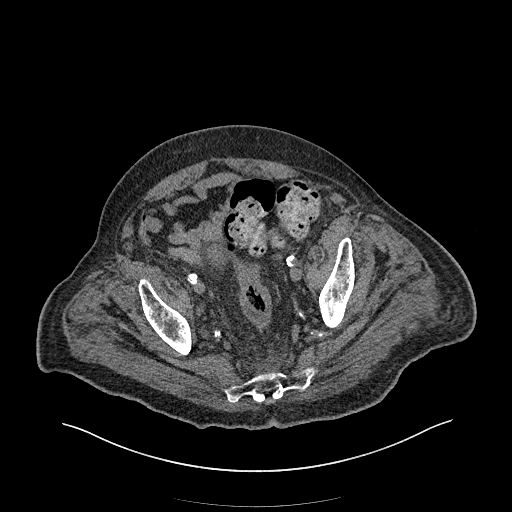
[im 93/369  soft-tissue]
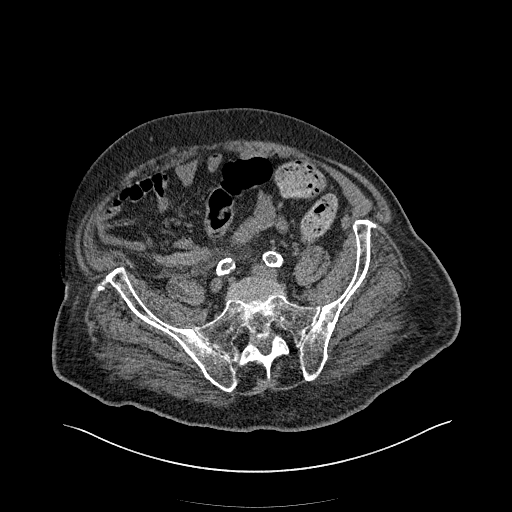
[im 116/369  soft-tissue]
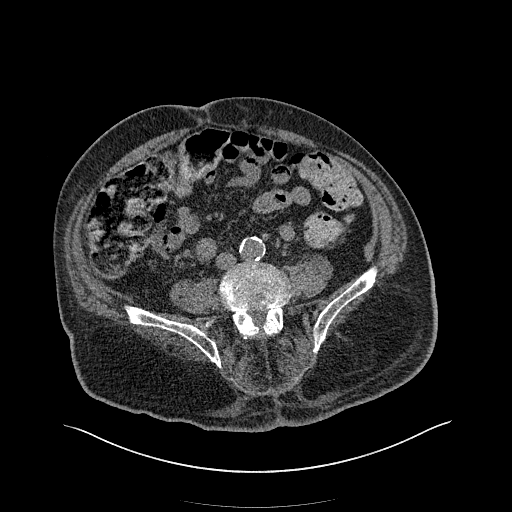
[im 162/369  soft-tissue]
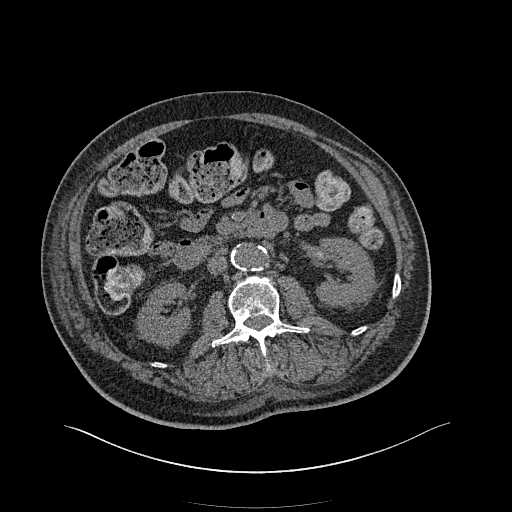
[im 185/369  soft-tissue]
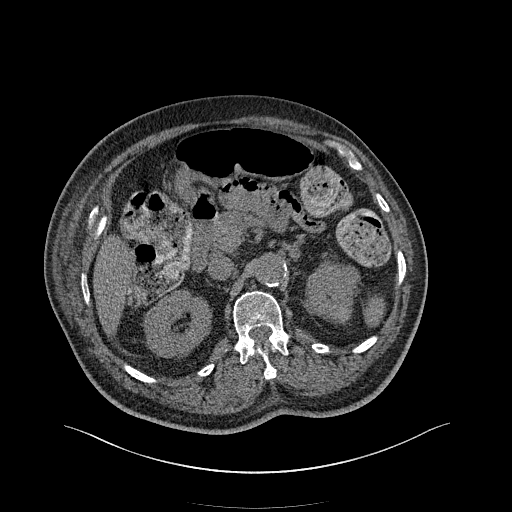
[im 208/369  soft-tissue]
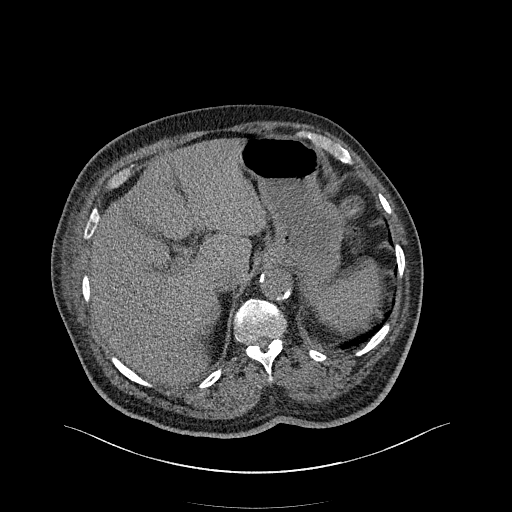
[im 254/369  soft-tissue]
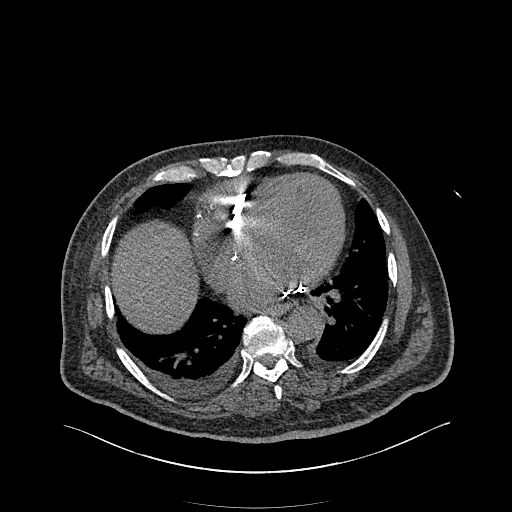
[im 277/369  soft-tissue]
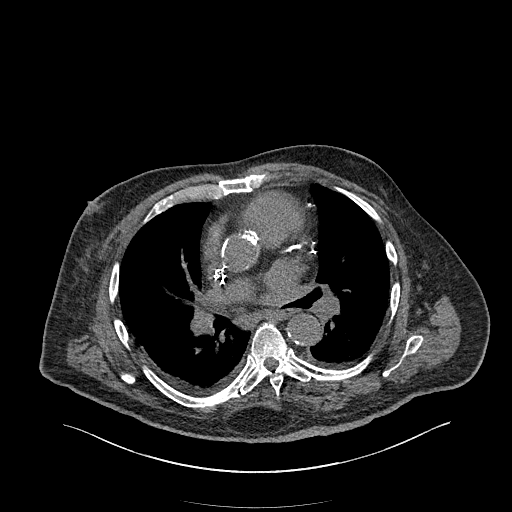
[im 277/369  bone]
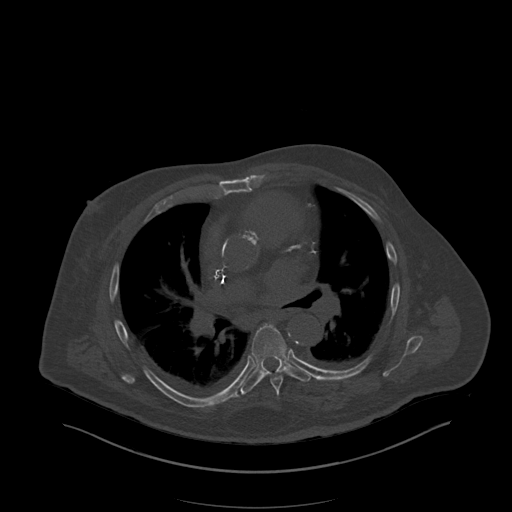
[im 300/369  soft-tissue]
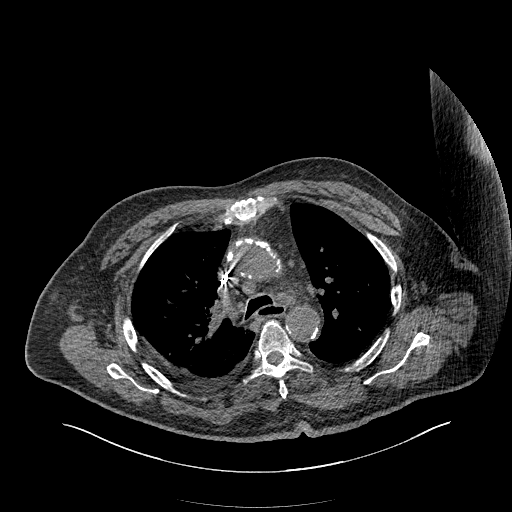
[im 346/369  soft-tissue]
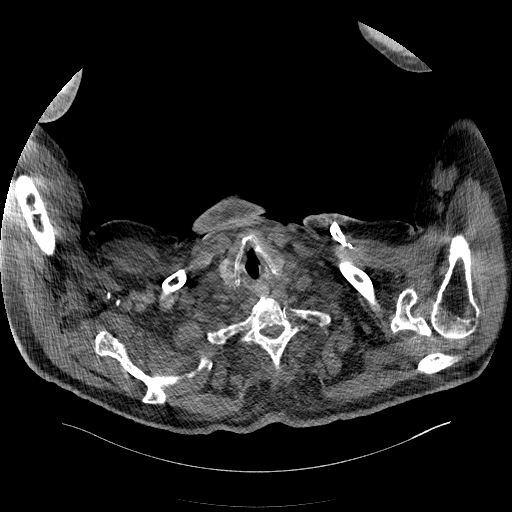

[14 of 46 positions shown; findings below may reference images not displayed]

FINDINGS: CT CHEST FINDINGS

Cardiovascular: Normal heart size. No pericardial effusion. Aortic
atherosclerosis. Calcification in the LAD and RCA and left
circumflex coronary artery.

Mediastinum/Nodes: The trachea appears patent and is midline. Right
paratracheal lymph node measures 1.2 cm. There is a left
paratracheal lymph node measuring 1.3 cm. No axillary or
supraclavicular adenopathy.

Lungs/Pleura: Small right pleural effusion with overlying
calcifications identified. No suspicious pulmonary nodules
identified.

Musculoskeletal: No aggressive lytic or sclerotic bone lesions.

CT ABDOMEN PELVIS FINDINGS

Hepatobiliary: Within the limitations of unenhanced technique there
is no focal liver abnormality.

Pancreas: Unremarkable. No pancreatic ductal dilatation or
surrounding inflammatory changes.

Spleen: Normal in size without focal abnormality.

Adrenals/Urinary Tract: Normal appearance of the left adrenal gland.
Right adrenal nodule is identified measuring 3.4 cm and 12 HU, image
67 of series 3. Normal appearance of the right kidney. Unremarkable
appearance of the left kidney. Left kidney calcifications are likely
vascular in etiology. No mass or hydronephrosis identified.

Stomach/Bowel: Stomach is normal. There is no pathologic dilatation
of the small bowel loops. Moderate stool burden identified
throughout the colon. No pathologic dilatation of the large bowel
loops. Circumferential wall thickening involving the rectum is
identified with wall thickness measuring up to 11 mm no obstructing
mass.

Vascular/Lymphatic: Aortic atherosclerosis. Splenic artery aneurysm
is identified measuring 2 cm. Infrarenal abdominal aorta is
increased in caliber measuring 2.7 cm. No pathologically enlarged
upper abdominal lymph nodes. No pelvic or inguinal adenopathy
identified.

Reproductive: Prostate is unremarkable.

Other: Postoperative changes involving the right common femoral
artery identified. No free fluid or fluid collections within the
abdomen or pelvis.

Musculoskeletal: No aggressive lytic or sclerotic bone lesions.
IMPRESSION: 1. Wall thickening involving the rectum is identified compatible
with clinical history of rectal cancer. No obstructing mass
identified.
2. There are no specific findings identified to suggest metastatic
disease within the pelvis or distant metastatic disease to the
abdomen or chest.
3. Aortic Atherosclerosis ([id number]). Three vessel coronary
artery calcifications noted.
4. Ectatic abdominal aorta at risk for aneurysm development.
Recommend followup by ultrasound in 5 years. This recommendation
follows ACR consensus guidelines: White Paper of the ACR Incidental
Findings Committee II on Vascular Findings. [HOSPITAL] 3363;
[DATE].
5. 2 cm splenic artery aneurysm.
6. Small right pleural effusion with overlying pleural
calcifications noted.
